# Patient Record
Sex: Female | Born: 1950
Health system: Southern US, Community
[De-identification: ages and names within clinical notes are randomized; demographics above are authoritative.]

## PROBLEM LIST (undated history)

## (undated) DIAGNOSIS — R112 Nausea with vomiting, unspecified: Secondary | ICD-10-CM

## (undated) DIAGNOSIS — T8859XA Other complications of anesthesia, initial encounter: Secondary | ICD-10-CM

## (undated) DIAGNOSIS — K219 Gastro-esophageal reflux disease without esophagitis: Secondary | ICD-10-CM

## (undated) DIAGNOSIS — R011 Cardiac murmur, unspecified: Secondary | ICD-10-CM

## (undated) DIAGNOSIS — F419 Anxiety disorder, unspecified: Secondary | ICD-10-CM

## (undated) DIAGNOSIS — R519 Headache, unspecified: Secondary | ICD-10-CM

## (undated) DIAGNOSIS — R42 Dizziness and giddiness: Secondary | ICD-10-CM

## (undated) DIAGNOSIS — E559 Vitamin D deficiency, unspecified: Secondary | ICD-10-CM

## (undated) DIAGNOSIS — R002 Palpitations: Secondary | ICD-10-CM

## (undated) DIAGNOSIS — M7989 Other specified soft tissue disorders: Secondary | ICD-10-CM

## (undated) DIAGNOSIS — R1310 Dysphagia, unspecified: Secondary | ICD-10-CM

## (undated) DIAGNOSIS — Z9889 Other specified postprocedural states: Secondary | ICD-10-CM

## (undated) DIAGNOSIS — L299 Pruritus, unspecified: Secondary | ICD-10-CM

## (undated) DIAGNOSIS — K449 Diaphragmatic hernia without obstruction or gangrene: Secondary | ICD-10-CM

## (undated) DIAGNOSIS — M2550 Pain in unspecified joint: Secondary | ICD-10-CM

## (undated) DIAGNOSIS — K589 Irritable bowel syndrome without diarrhea: Secondary | ICD-10-CM

## (undated) DIAGNOSIS — R5383 Other fatigue: Secondary | ICD-10-CM

## (undated) DIAGNOSIS — K279 Peptic ulcer, site unspecified, unspecified as acute or chronic, without hemorrhage or perforation: Secondary | ICD-10-CM

## (undated) DIAGNOSIS — R609 Edema, unspecified: Secondary | ICD-10-CM

## (undated) DIAGNOSIS — F439 Reaction to severe stress, unspecified: Secondary | ICD-10-CM

## (undated) DIAGNOSIS — R059 Cough, unspecified: Secondary | ICD-10-CM

## (undated) DIAGNOSIS — T4145XA Adverse effect of unspecified anesthetic, initial encounter: Secondary | ICD-10-CM

## (undated) DIAGNOSIS — M1990 Unspecified osteoarthritis, unspecified site: Secondary | ICD-10-CM

## (undated) DIAGNOSIS — G459 Transient cerebral ischemic attack, unspecified: Secondary | ICD-10-CM

## (undated) DIAGNOSIS — R0602 Shortness of breath: Secondary | ICD-10-CM

## (undated) DIAGNOSIS — F32A Depression, unspecified: Secondary | ICD-10-CM

## (undated) DIAGNOSIS — H9319 Tinnitus, unspecified ear: Secondary | ICD-10-CM

## (undated) DIAGNOSIS — D649 Anemia, unspecified: Secondary | ICD-10-CM

## (undated) DIAGNOSIS — B009 Herpesviral infection, unspecified: Secondary | ICD-10-CM

## (undated) DIAGNOSIS — R05 Cough: Secondary | ICD-10-CM

## (undated) DIAGNOSIS — R682 Dry mouth, unspecified: Secondary | ICD-10-CM

## (undated) DIAGNOSIS — I8390 Asymptomatic varicose veins of unspecified lower extremity: Secondary | ICD-10-CM

## (undated) DIAGNOSIS — M109 Gout, unspecified: Secondary | ICD-10-CM

## (undated) DIAGNOSIS — R51 Headache: Secondary | ICD-10-CM

## (undated) DIAGNOSIS — R062 Wheezing: Secondary | ICD-10-CM

## (undated) DIAGNOSIS — I341 Nonrheumatic mitral (valve) prolapse: Secondary | ICD-10-CM

## (undated) DIAGNOSIS — J189 Pneumonia, unspecified organism: Secondary | ICD-10-CM

## (undated) DIAGNOSIS — K759 Inflammatory liver disease, unspecified: Secondary | ICD-10-CM

## (undated) DIAGNOSIS — I10 Essential (primary) hypertension: Secondary | ICD-10-CM

## (undated) DIAGNOSIS — H539 Unspecified visual disturbance: Secondary | ICD-10-CM

## (undated) DIAGNOSIS — I499 Cardiac arrhythmia, unspecified: Secondary | ICD-10-CM

## (undated) DIAGNOSIS — R079 Chest pain, unspecified: Secondary | ICD-10-CM

## (undated) DIAGNOSIS — F329 Major depressive disorder, single episode, unspecified: Secondary | ICD-10-CM

## (undated) DIAGNOSIS — H532 Diplopia: Secondary | ICD-10-CM

## (undated) DIAGNOSIS — J45909 Unspecified asthma, uncomplicated: Secondary | ICD-10-CM

## (undated) DIAGNOSIS — M436 Torticollis: Secondary | ICD-10-CM

## (undated) DIAGNOSIS — M549 Dorsalgia, unspecified: Secondary | ICD-10-CM

## (undated) DIAGNOSIS — R197 Diarrhea, unspecified: Secondary | ICD-10-CM

## (undated) HISTORY — DX: Irritable bowel syndrome, unspecified: K58.9

## (undated) HISTORY — DX: Diaphragmatic hernia without obstruction or gangrene: K44.9

## (undated) HISTORY — DX: Dizziness and giddiness: R42

## (undated) HISTORY — DX: Major depressive disorder, single episode, unspecified: F32.9

## (undated) HISTORY — DX: Gastro-esophageal reflux disease without esophagitis: K21.9

## (undated) HISTORY — DX: Reaction to severe stress, unspecified: F43.9

## (undated) HISTORY — DX: Asymptomatic varicose veins of unspecified lower extremity: I83.90

## (undated) HISTORY — PX: CARDIAC CATHETERIZATION: SHX172

## (undated) HISTORY — DX: Palpitations: R00.2

## (undated) HISTORY — DX: Transient cerebral ischemic attack, unspecified: G45.9

## (undated) HISTORY — DX: Other fatigue: R53.83

## (undated) HISTORY — PX: FOOT SURGERY: SHX648

## (undated) HISTORY — DX: Cardiac murmur, unspecified: R01.1

## (undated) HISTORY — DX: Dry mouth, unspecified: R68.2

## (undated) HISTORY — DX: Vitamin D deficiency, unspecified: E55.9

## (undated) HISTORY — DX: Tinnitus, unspecified ear: H93.19

## (undated) HISTORY — DX: Pruritus, unspecified: L29.9

## (undated) HISTORY — DX: Cough: R05

## (undated) HISTORY — DX: Dorsalgia, unspecified: M54.9

## (undated) HISTORY — DX: Diarrhea, unspecified: R19.7

## (undated) HISTORY — DX: Nonrheumatic mitral (valve) prolapse: I34.1

## (undated) HISTORY — DX: Cough, unspecified: R05.9

## (undated) HISTORY — DX: Depression, unspecified: F32.A

## (undated) HISTORY — DX: Wheezing: R06.2

## (undated) HISTORY — DX: Anxiety disorder, unspecified: F41.9

## (undated) HISTORY — DX: Dysphagia, unspecified: R13.10

## (undated) HISTORY — DX: Shortness of breath: R06.02

## (undated) HISTORY — DX: Torticollis: M43.6

## (undated) HISTORY — PX: VAGINAL HYSTERECTOMY: SUR661

## (undated) HISTORY — DX: Pain in unspecified joint: M25.50

## (undated) HISTORY — DX: Unspecified visual disturbance: H53.9

## (undated) HISTORY — DX: Unspecified osteoarthritis, unspecified site: M19.90

## (undated) HISTORY — PX: CHOLECYSTECTOMY: SHX55

## (undated) HISTORY — DX: Peptic ulcer, site unspecified, unspecified as acute or chronic, without hemorrhage or perforation: K27.9

## (undated) HISTORY — PX: OTHER SURGICAL HISTORY: SHX169

## (undated) HISTORY — DX: Gout, unspecified: M10.9

## (undated) HISTORY — PX: GALLBLADDER SURGERY: SHX652

## (undated) HISTORY — PX: LASIK: SHX215

## (undated) HISTORY — PX: COLONOSCOPY: SHX174

## (undated) HISTORY — PX: APPENDECTOMY: SHX54

## (undated) HISTORY — DX: Headache: R51

## (undated) HISTORY — DX: Headache, unspecified: R51.9

## (undated) HISTORY — DX: Edema, unspecified: R60.9

## (undated) HISTORY — PX: EYE SURGERY: SHX253

## (undated) HISTORY — PX: BREAST EXCISIONAL BIOPSY: SUR124

## (undated) HISTORY — DX: Diplopia: H53.2

## (undated) HISTORY — DX: Chest pain, unspecified: R07.9

## (undated) HISTORY — DX: Other specified soft tissue disorders: M79.89

---
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
## 1998-07-10 ENCOUNTER — Encounter: Payer: Self-pay | Admitting: Family Medicine

## 1998-07-10 ENCOUNTER — Ambulatory Visit (HOSPITAL_COMMUNITY): Admission: RE | Admit: 1998-07-10 | Discharge: 1998-07-10 | Payer: Self-pay | Admitting: Family Medicine

## 1998-07-22 ENCOUNTER — Other Ambulatory Visit: Admission: RE | Admit: 1998-07-22 | Discharge: 1998-07-22 | Payer: Self-pay | Admitting: Gastroenterology

## 1998-12-11 ENCOUNTER — Emergency Department (HOSPITAL_COMMUNITY): Admission: EM | Admit: 1998-12-11 | Discharge: 1998-12-12 | Payer: Self-pay | Admitting: Emergency Medicine

## 2001-05-08 ENCOUNTER — Encounter (INDEPENDENT_AMBULATORY_CARE_PROVIDER_SITE_OTHER): Payer: Self-pay

## 2001-05-08 ENCOUNTER — Observation Stay (HOSPITAL_COMMUNITY): Admission: RE | Admit: 2001-05-08 | Discharge: 2001-05-09 | Payer: Self-pay

## 2001-07-07 ENCOUNTER — Ambulatory Visit (HOSPITAL_COMMUNITY): Admission: RE | Admit: 2001-07-07 | Discharge: 2001-07-07 | Payer: Self-pay | Admitting: Family Medicine

## 2001-07-07 ENCOUNTER — Encounter: Payer: Self-pay | Admitting: Family Medicine

## 2001-09-26 ENCOUNTER — Ambulatory Visit (HOSPITAL_BASED_OUTPATIENT_CLINIC_OR_DEPARTMENT_OTHER): Admission: RE | Admit: 2001-09-26 | Discharge: 2001-09-26 | Payer: Self-pay | Admitting: Orthopedic Surgery

## 2001-11-15 ENCOUNTER — Encounter: Payer: Self-pay | Admitting: Family Medicine

## 2001-11-15 ENCOUNTER — Ambulatory Visit (HOSPITAL_COMMUNITY): Admission: RE | Admit: 2001-11-15 | Discharge: 2001-11-15 | Payer: Self-pay | Admitting: Family Medicine

## 2002-01-11 ENCOUNTER — Ambulatory Visit (HOSPITAL_COMMUNITY): Admission: RE | Admit: 2002-01-11 | Discharge: 2002-01-11 | Payer: Self-pay | Admitting: Family Medicine

## 2002-01-11 ENCOUNTER — Encounter: Payer: Self-pay | Admitting: Family Medicine

## 2002-03-08 ENCOUNTER — Ambulatory Visit (HOSPITAL_COMMUNITY): Admission: RE | Admit: 2002-03-08 | Discharge: 2002-03-08 | Payer: Self-pay | Admitting: Gastroenterology

## 2002-05-22 ENCOUNTER — Ambulatory Visit (HOSPITAL_BASED_OUTPATIENT_CLINIC_OR_DEPARTMENT_OTHER): Admission: RE | Admit: 2002-05-22 | Discharge: 2002-05-22 | Payer: Self-pay | Admitting: Orthopedic Surgery

## 2002-06-24 ENCOUNTER — Ambulatory Visit (HOSPITAL_BASED_OUTPATIENT_CLINIC_OR_DEPARTMENT_OTHER): Admission: RE | Admit: 2002-06-24 | Discharge: 2002-06-24 | Payer: Self-pay | Admitting: Orthopedic Surgery

## 2002-10-06 ENCOUNTER — Emergency Department (HOSPITAL_COMMUNITY): Admission: EM | Admit: 2002-10-06 | Discharge: 2002-10-07 | Payer: Self-pay | Admitting: Emergency Medicine

## 2003-05-13 ENCOUNTER — Ambulatory Visit (HOSPITAL_COMMUNITY): Admission: RE | Admit: 2003-05-13 | Discharge: 2003-05-13 | Payer: Self-pay | Admitting: Orthopedic Surgery

## 2003-05-13 ENCOUNTER — Ambulatory Visit (HOSPITAL_COMMUNITY): Admission: RE | Admit: 2003-05-13 | Discharge: 2003-05-13 | Payer: Self-pay | Admitting: Family Medicine

## 2004-01-16 ENCOUNTER — Encounter: Admission: RE | Admit: 2004-01-16 | Discharge: 2004-01-16 | Payer: Self-pay | Admitting: Gastroenterology

## 2004-01-16 ENCOUNTER — Encounter: Admission: RE | Admit: 2004-01-16 | Discharge: 2004-01-16 | Payer: Self-pay | Admitting: Family Medicine

## 2004-05-31 ENCOUNTER — Inpatient Hospital Stay (HOSPITAL_COMMUNITY): Admission: EM | Admit: 2004-05-31 | Discharge: 2004-06-03 | Payer: Self-pay | Admitting: Emergency Medicine

## 2004-06-01 ENCOUNTER — Encounter (INDEPENDENT_AMBULATORY_CARE_PROVIDER_SITE_OTHER): Payer: Self-pay | Admitting: Cardiology

## 2004-06-02 ENCOUNTER — Encounter: Payer: Self-pay | Admitting: Internal Medicine

## 2004-07-16 ENCOUNTER — Ambulatory Visit: Admission: RE | Admit: 2004-07-16 | Discharge: 2004-07-16 | Payer: Self-pay | Admitting: Family Medicine

## 2005-11-18 ENCOUNTER — Encounter: Admission: RE | Admit: 2005-11-18 | Discharge: 2005-11-18 | Payer: Self-pay | Admitting: Gastroenterology

## 2005-11-25 ENCOUNTER — Encounter: Admission: RE | Admit: 2005-11-25 | Discharge: 2005-11-25 | Payer: Self-pay | Admitting: Gastroenterology

## 2006-02-06 ENCOUNTER — Ambulatory Visit (HOSPITAL_COMMUNITY): Admission: RE | Admit: 2006-02-06 | Discharge: 2006-02-06 | Payer: Self-pay | Admitting: Interventional Cardiology

## 2006-02-06 ENCOUNTER — Encounter (INDEPENDENT_AMBULATORY_CARE_PROVIDER_SITE_OTHER): Payer: Self-pay | Admitting: *Deleted

## 2006-02-08 ENCOUNTER — Emergency Department (HOSPITAL_COMMUNITY): Admission: EM | Admit: 2006-02-08 | Discharge: 2006-02-08 | Payer: Self-pay | Admitting: Emergency Medicine

## 2008-05-09 ENCOUNTER — Encounter: Admission: RE | Admit: 2008-05-09 | Discharge: 2008-05-09 | Payer: Self-pay | Admitting: Family Medicine

## 2008-05-13 ENCOUNTER — Other Ambulatory Visit: Admission: RE | Admit: 2008-05-13 | Discharge: 2008-05-13 | Payer: Self-pay | Admitting: Family Medicine

## 2008-06-07 ENCOUNTER — Emergency Department (HOSPITAL_COMMUNITY): Admission: EM | Admit: 2008-06-07 | Discharge: 2008-06-07 | Payer: Self-pay | Admitting: Emergency Medicine

## 2008-08-26 ENCOUNTER — Emergency Department (HOSPITAL_COMMUNITY): Admission: EM | Admit: 2008-08-26 | Discharge: 2008-08-26 | Payer: Self-pay | Admitting: Emergency Medicine

## 2008-11-15 ENCOUNTER — Encounter: Admission: RE | Admit: 2008-11-15 | Discharge: 2008-11-15 | Payer: Self-pay | Admitting: Orthopedic Surgery

## 2009-03-19 ENCOUNTER — Emergency Department (HOSPITAL_COMMUNITY): Admission: EM | Admit: 2009-03-19 | Discharge: 2009-03-19 | Payer: Self-pay | Admitting: Emergency Medicine

## 2009-03-24 ENCOUNTER — Encounter: Admission: RE | Admit: 2009-03-24 | Discharge: 2009-03-24 | Payer: Self-pay | Admitting: Family Medicine

## 2009-05-26 ENCOUNTER — Encounter: Admission: RE | Admit: 2009-05-26 | Discharge: 2009-05-26 | Payer: Self-pay | Admitting: Gastroenterology

## 2009-06-08 ENCOUNTER — Encounter: Admission: RE | Admit: 2009-06-08 | Discharge: 2009-06-08 | Payer: Self-pay | Admitting: Orthopedic Surgery

## 2010-07-25 ENCOUNTER — Encounter: Payer: Self-pay | Admitting: Gastroenterology

## 2010-10-01 ENCOUNTER — Other Ambulatory Visit: Payer: Self-pay | Admitting: Gastroenterology

## 2010-10-01 DIAGNOSIS — R197 Diarrhea, unspecified: Secondary | ICD-10-CM

## 2010-10-05 ENCOUNTER — Other Ambulatory Visit: Payer: Self-pay | Admitting: Gastroenterology

## 2010-10-05 ENCOUNTER — Ambulatory Visit
Admission: RE | Admit: 2010-10-05 | Discharge: 2010-10-05 | Disposition: A | Discharge status: Home / Self Care | Payer: Managed Care, Other (non HMO) | Source: Ambulatory Visit | Attending: Gastroenterology | Admitting: Gastroenterology

## 2010-10-05 DIAGNOSIS — R197 Diarrhea, unspecified: Secondary | ICD-10-CM

## 2010-10-08 LAB — POCT URINALYSIS DIP (DEVICE)
Bilirubin Urine: NEGATIVE
Glucose, UA: NEGATIVE mg/dL
Ketones, ur: NEGATIVE mg/dL
Protein, ur: NEGATIVE mg/dL
Urobilinogen, UA: 0.2 mg/dL (ref 0.0–1.0)
pH: 5.5 (ref 5.0–8.0)

## 2010-10-08 LAB — URINALYSIS, MICROSCOPIC ONLY
Bilirubin Urine: NEGATIVE
pH: 6 (ref 5.0–8.0)

## 2010-10-08 LAB — URINE CULTURE

## 2010-11-19 NOTE — Op Note (Signed)
[facility]  Patient:    Jasmine Contreras, Jasmine Contreras Visit Number: [id number] MRN: [id number]          Service Type: SUR Location: 3W [id number] 02 Attending Physician:  Meredith Leeds Proc. Date: 05/08/01 Admit Date:  05/08/2001   CC:         Petra Kuba, M.D.   Operative Report  PREOPERATIVE DIAGNOSES:  Symptomatic gallstones and adhesions.  POSTOPERATIVE DIAGNOSES:  Symptomatic gallstones and adhesions.  OPERATION: 1. Lysis of adhesions. 2. Laparoscopic cholecystectomy.  SURGEON:  Zigmund Daniel, M.D.  ANESTHESIA:  General.  DESCRIPTION OF PROCEDURE:  After the patient had adequate general anesthesia and was well-monitored, and had routine preparation and draping of the abdomen, I infused local anesthetic just below the umbilicus and subsequently anesthetized other port sites.  I made a short transverse incision below the umbilicus and dissected down to the fascia.  Scar was present due to previous surgery.  I cut the fascia longitudinally in the midline for about 1.5 cm and then opened the peritoneum bluntly.  Adhesions were present, and I swept them away and felt that I had a path to the right upper quadrant toward the right side of the abdomen.  I then put in a pursestring 0 Vicryl suture and secured a Hasson cannula and inflated the abdomen with CO2, then put in a camera and saw that there was visibility of the gallbladder and liver, although it was somewhat limited due to previous adhesions.  I did get in a 10 mm epigastric port and two 5 mm right mid abdominal ports and with the patient placed head-up, foot-down, and tilted toward the left, I took down adhesions.  There were quite a few adhesions of the dome of the liver up to the diaphragm and in taking those down, the mobility of the liver was increased.  I then grasped the fundus of the gallbladder with a clamp, elevated it toward the right shoulder, and took down adhesions to the  undersurface of the gallbladder and liver using the scissors and the dissector.  The fundus of the gallbladder pulled up easily, and I was able to dissect the infundibulum of the gallbladder and clearly see the cystic duct emerging from it and also clearly see the cystic artery.  I clipped the cystic duct with four clips and clipped cystic artery with three clips and cut each structure between the two clips closest to the gallbladder.  I then dissected the gallbladder out of the fossa using the spatula cautery and removed it intact.  I got hemostasis with the cautery.  Hemostasis was not a problem.  I removed the gallbladder from the body through the umbilical incision and tied the pursestring suture.  I then copiously irrigated the right upper quadrant and removed the irrigant, and again, assured good hemostasis.  Sponge, needle, and instrument counts were correct.  I closed each of the skin incisions with intracuticular 4-0 Vicryl and Steri-Strips and applied small bandages.  The patient was stable through the operation. Attending Physician:  Meredith Leeds DD:  05/08/01 TD:  05/09/01 Job: [id number]

## 2010-11-19 NOTE — Discharge Summary (Signed)
Jasmine Contreras, SLAVEN            ACCOUNT NO.:  [id number]   MEDICAL RECORD NO.:  [id number]          PATIENT TYPE:  INP   LOCATION:  5709                         FACILITY:  [facility]   PHYSICIAN:  Deirdre Peer. Polite, M.D. DATE OF BIRTH:  1950/10/17   DATE OF ADMISSION:  05/31/2004  DATE OF DISCHARGE:  06/03/2004                                 DISCHARGE SUMMARY   DISCHARGE DIAGNOSES:  1.  Diffuse bronchitis.  Please note no history of asthma or other reactive      airway disease or chronic obstructive pulmonary disease.  Will treat      empirically with antibiotics and steroids with nebulizer for further      outpatient followup per primary physician.  2.  Acute dyspnea, presumed congestive heart failure on admission.  Please      note negative cardiac evaluation.   DISCHARGE MEDICATIONS:  The patient will resume outpatient medications.  1.  Zoloft 50 mg 1 daily.  2.  Azithromycin 250 mg 1 daily x 5 days.  3.  Prednisone taper over 10 days.  4.  Albuterol MDI 2 puffs four times a day x 3 days, then p.r.n.   DISPOSITION:  The patient is discharged home in stable condition. Will  follow up with primary physician in one week. Recommend further outpatient  followup; i.e., chest x-ray and set of PFTs to rule out undiagnosed reactive  airway disease.   CONSULTATIONS:  Armanda Magic, M.D., cardiology.   STUDIES:  The patient had a chest x-ray which showed probable CHF,  increasing perihilar and lower lobe opacities bilaterally.  No confluent air  space opacity or significant pleural effusion seen.   CT of the chest:  No PE, bilateral lower lobe atelectasis, diffuse  bronchitic changes, borderline cardiomegaly, multiple liver cysts.   CBC on admission within normal limits.  BMET essentially within normal  limits except for mild hypokalemia.  Cardiac enzymes negative for ischemia.  BNP 53.  TSH 2.0.  Sed rate 36.  Lipid panel:  Total cholesterol 166, HDL  40, LDL 79.  ANA negative.   Rheumatoid factor less than 20.  ABG on room air  showed pH of 7.4, PCO2 of 35, PO2 of 71.   HISTORY OF PRESENT ILLNESS:  A 60 year old female with multiple medical  problems presented to the ED with sudden onset of shortness of breath and  congestion during sexual activity.  In the ED, the patient was evaluated and  had abnormal chest x-ray.  Concern for flash pulmonary edema was  entertained.  The patient was admitted for further evaluation and treatment.  Please see dictated H&P for further details.   MEDICATIONS ON ADMISSION:  Zoloft.   PAST MEDICAL HISTORY:  1.  Brain stem cyst.  2.  Obesity.  3.  History of hiatal hernia with GERD.  4.  History of vagotomy secondary to peptic ulcer disease in the distant      past.   PAST SURGICAL HISTORY:  1.  Cholecystectomy in 2001.  2.  Total abdominal hysterectomy.  3.  Bilateral bursectomy of greater trochanter.  4.  Laparoscopic knee surgery.  SOCIAL HISTORY:  Negative for tobacco x 20 years.  No alcohol, no drugs.   REVIEW OF SYSTEMS:  As stated in HPI.   HOSPITAL COURSE:  The patient was admitted to the medicine floor for  evaluation and treatment of acute onset dyspnea with concern for flash  pulmonary edema.  The patient had serial cardiac enzymes which were negative  for ischemia, a BNP which was within normal limits.  Because of concern for  flash pulmonary edema, the patient was seen in consultation by cardiology.  The patient had an echocardiogram which showed an EF in the range of 55 to  65%, no wall motion abnormalities.  The patient also underwent cardiac  catheterization which showed normal coronary arteries and low-normal EF of  50%, mild MR. There was no evidence of coronary artery disease or vasospasm  during cardiac catheterization.   Per cardiac consult, the patient's dyspnea was ruled out.  The patient had  further evaluation to rule out pulmonary cause.  As her exam had bibasilar  crackles, concern for  interstitial lung disease was entertained.  The  possibilities include asthma, PE, sarcoidosis, etc.  The patient has several  biochemical studies, sed rate, ANA which were within normal limits.  The  patient had a CT scan of chest, negative PE, did show mild bilateral lower  lobe atelectasis, diffuse bronchitic changes.  Also showed several liver  cysts.   At this time, as the patient did not have identifiable cardiac cause for  dizziness, it was thought that most likely pulmonary cause was source.  The  patient does not carry a history of asthma and has not smoked in greater  than 20 years.  The patient will be treated with antibiotics and a steroid  taper as well as inhaler.  Recommend patient have further outpatient  followup per primary MD.  If the patient again had exertional symptoms of  dyspnea, probably should continue to evaluate from a pulmonary standpoint.  The patient may require pulmonary function tests on an outpatient basis.   At this time, the patient is stable for discharge.  The patient has other  chronic medical problems, and she will continue her medications as outlined.      Joen Laura   RDP/MEDQ  D:  06/03/2004  T:  06/03/2004  Job:  [id number]   cc:   Dario Guardian, M.D.  [address]  [place]  [place] [address]  Fax: [phone]

## 2010-11-19 NOTE — Op Note (Signed)
[place]  Patient:    Jasmine Contreras, Jasmine Contreras Visit Number: [id number] MRN: [id number]          Service Type: DSU Location: [facility] Attending Physician:  Milly Jakob Dictated by:   Harvie Junior, M.D. Proc. Date: 09/26/01 Admit Date:  09/26/2001                             Operative Report  PREOPERATIVE DIAGNOSIS:  Medial meniscal tear versus medial patellofemoral pain.  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  PRINCIPAL PROCEDURE: 1. Debridement of medial shelf plica. 2. Debridement of medial patellar chondromalacia.  SURGEON:  Harvie Junior, M.D.  ASSISTANT:  Currie Paris. Thedore Mins.  ANESTHESIA:  General.  BRIEF HISTORY:  This is a 60 year old female with a long history of having medial sided joint pain.  She ultimately was evaluated and felt to have no significant degenerative changes.  MRI was obtained, which showed some degenerative changes in the meniscus, but no obvious meniscal tear.  She was followed conservatively, but continued to have significant pain and ultimately was seen by Dr. Turner Daniels and set up for surgery because of continued ______ medial sided pain.  She is brought to the operating room for this procedure.  PROCEDURE:  The patient was brought to the operating room and after adequate anesthesia was obtained with general anesthetic, the patient placed supine on the operating table. The right knee was then prepped and draped in usual sterile fashion and then following this, examination under anesthesia noted to be stable in all directions.  Routine arthroscopic examination revealed that there was obvious chondromalacia of the medial femoral condyle.  This was debrided with a suction-shaver back to a smooth and stable rim.  There was an obvious medial plica, which was causing the erosion on the medial femoral condyle and this was debrided so that there was no pressure against  the medial femoral condyle. Attention was turned to opening the patellofemoral joint. Slight lateral tracking, not dramatic, but there was some chondral injury on the medial patellar facet.  This was debrided with a suction-shaver. Attention was then turned to the medial joint line, where the medial meniscus was noted to be stable.  There was some degenerative area that could be probed, although the meniscus could not be brought forward.  There certainly was a central area in the meniscus, which was causative of some concern.  We probed it at length. It was felt to be stable in all directions.  We did a good long look at the medial femoral condyle, no soft areas, no areas of chondromalacia.  Turned to the anterior cruciate, which was normal.  Turned to the lateral side, which was normal and then probed the lateral side, the meniscus, as well as the articular cartilage.  Ultimately felt that we had taken care of the pathology with debridement of the medial patellar facet, as well as the takedown of the medial plica.  At this point, the knee was copiously irrigated and suctioned dry.  Arthroscopic portals were closed with sterile and compressive dressing.  The patient was taken to recovery room, where she was noted to be satisfactory condition.  Estimated blood loss for the procedure was none. Dictated by:   Harvie Junior, M.D. Attending Physician:  Milly Jakob DD:  09/26/01 TD:  09/27/01 Job: [id number]

## 2010-11-19 NOTE — Op Note (Signed)
NAME:  Jasmine Contreras, Jasmine Contreras                      ACCOUNT NO.:  [id number]   MEDICAL RECORD NO.:  [id number]                   PATIENT TYPE:  AMB   LOCATION:  DSC                                  FACILITY:  [facility]   PHYSICIAN:  Harvie Junior, M.D.                DATE OF BIRTH:  06-Oct-1950   DATE OF PROCEDURE:  06/24/2002  DATE OF DISCHARGE:                                 OPERATIVE REPORT   PREOPERATIVE DIAGNOSIS:  Iliotibial band tendinitis with prominent  trochanteric ridge, left hip.   POSTOPERATIVE DIAGNOSIS:  Iliotibial band tendinitis with prominent  trochanteric ridge, left hip.   OPERATION PERFORMED:  1. Release of iliotibial band with debridement of trochanteric bursitis.  2. Partial exostectomy of the greater trochanter.   SURGEON:  Harvie Junior, M.D.   ANESTHESIA:  General.   INDICATIONS FOR PROCEDURE:  The patient is a 60 year old female with a long  history of having severe bilateral trochanteric bursitis.  She has done well  with injection therapy into each of her hips.  Because of recurrence, she  had been treated conservatively for a long period of time.  Because of  continued complaints of pain she was taken to the operating room for  iliotibial band incision, trochanteric bursectomy and partial exostectomy of  the greater trochanter.   DESCRIPTION OF PROCEDURE:  The patient was taken to the operating room.  After adequate anesthesia was obtained with general anesthetic, the patient  was placed supine on the operating table where she was moved to the right  lateral decubitus position.  All bony prominences were well padded and an  axillary roll was put in place.  Following this, attention was turned to the  left hip where a curved incision was made over the proximal area of the hip.  Subcutaneous tissue was dissected down to the level of the greater  trochanter, the IP band was identified and clearly divided in line with its  fibers.  It was then divided  both proximally and distally to create a T type  effect.  Following this, there was no tendency for any tendon to be over the  greater trochanter.  Following this, the trochanteric bursa was identified  and excised from the gluteus medius insertion down to the really as far as  we could see down the vastus lateralis.  The wound was then copiously  irrigated and suctioned dry.  Attention was turned to the greater trochanter  where the trochanteric ridge was identified and a rongeur was used to excise  significant portions of the trochanteric ridge.  Following this, the wound  was copiously irrigated and suctioned dry.  The portals of the area were  closed with deep suture and the subcu was closed with 0 and 2-0 Vicryl, the  skin with staples.  The estimated blood loss for this procedure was less  than 25 cc.  Harvie Junior, M.D.    Ranae Plumber  D:  06/24/2002  T:  06/24/2002  Job:  [id number]

## 2010-11-19 NOTE — Op Note (Signed)
NAME:  Jasmine Contreras, Jasmine Contreras                      ACCOUNT NO.:  [id number]   MEDICAL RECORD NO.:  [id number]                   PATIENT TYPE:  AMB   LOCATION:  DSC                                  FACILITY:  [facility]   PHYSICIAN:  Harvie Junior, M.D.                DATE OF BIRTH:  09/17/1950   DATE OF PROCEDURE:  DATE OF DISCHARGE:                                 OPERATIVE REPORT   PREOPERATIVE DIAGNOSIS:  Iliotibial band tendinitis, recalcitrant to  conservative care.   POSTOPERATIVE DIAGNOSIS:  Iliotibial band tendinitis, recalcitrant to  conservative care.   PROCEDURE:  1. Iliotibial band release.  2. Partial exostectomy of the greater trochanteric bone.   SURGEON:  Dr. Luiz Blare.   ASSISTANT:  Marshia Ly, P.A.   ANESTHESIA:  General.   BRIEF HISTORY:  This 60 year old female with a long history of having  bilateral IT band tendinitis, injection therapy into the greater  trochanteric area seemed to work wonderfully for her IT band tendinitis, but  she continued to have recurrence of this.  Physical therapy was tried and  helped.  Anti-inflammatory medications were tried and helped.  Her back was  worked up to make sure she did not have bulging disk that could be causing  hip pain, but because of her improvement with injection therapy but lack of  complete response, she ultimately was brought to the operating room for this  procedure.   Prior to surgery, it was discussed with her 75% good and excellent results  with a 25% chance of her not having an improvement in her pain.  She  understood this risk and was taken to the operating room for this procedure.   DESCRIPTION OF PROCEDURE:  The patient was taken to the operating room and  after adequate general anesthesia was obtained, the patient was placed in  the left lateral decubitus position.  Axillary roll was put in place, all  bony prominences were well padded.  Attention was then turned to the right  hip where an  incision was made over the right hip.  Subcutaneous tissues  separated down to the level of the IT band.  The IT band was then identified  and divided in line with its fibers longitudinally.  The IT band was then  T'd in an anterior and posterior direction.  Excellent freeing up of the IT  band was achieved at this point.  Attention was then turned to the greater  trochanteric bursa where a massive bursectomy was undertaken as well as a  partial exostectomy.  The trochanter did have some areas of sharpness of  spurring and that kind of thing, and this was debrided with a rongeur back  to a smooth rim.  Bone wax was put in place over this area of roughened  bone.  At this point, the wound was copiously irrigated and suctioned dry.  Final check was made for any bleeding deep,  and none being found, the deep  and fascial layers were closed.  The IT band was left open.  Following  closure of the deep fascial layers, the skin was closed with skin  staples.  A sterile compressive dressing was applied.  The patient was taken  to the recovery room where she was noted to be in satisfactory condition.   ESTIMATED BLOOD LOSS:  Less than 50 cc.                                               Harvie Junior, M.D.    Ranae Plumber  D:  05/22/2002  T:  05/22/2002  Job:  [id number]

## 2010-11-19 NOTE — Op Note (Signed)
TNAMEGINNETTE, GATES                     ACCOUNT NO.:  [id number]   MEDICAL RECORD NO.:  [id number]                   PATIENT TYPE:  AMB   LOCATION:  ENDO                                 FACILITY:  [facility]   PHYSICIAN:  Petra Kuba, M.D.                 DATE OF BIRTH:  [birth year]   DATE OF PROCEDURE:  03/08/2002  DATE OF DISCHARGE:                                 OPERATIVE REPORT   PROCEDURE:  Colonoscopy.   INDICATION:  Normal CT scan, persistent GI symptoms.  Consent was signed  after risks, benefits, methods, options thoroughly discussed in the office.   MEDICINES USED:  Demerol 100, Versed 10.   DESCRIPTION OF PROCEDURE:  Rectal inspection was pertinent for external  hemorrhoids.  Digital exam was negative.  The video pediatric adjustable  colonoscope was inserted, easily advanced to about the level of the splenic  flexure.  At that point, there was looping, and rolling her on her back, we  were able to advance to the mid transverse.  Unfortunately, with increased  looping, we were unable to control the loop, and we elected to withdraw.  No  obvious abnormality but left-sided occasional diverticula were seen on  withdrawal.  We went ahead and inserted the regular colonoscope and this  time with abdominal pressure and rolling her on her back and then on her  right side, we were able to advance to the cecum which was identified by the  appendiceal orifice and the ileocecal valve.  The scope was then slowly  withdrawn.  The prep was adequate.  There was some liquid stool that  required washing and suctioning.  Other than the scattered left-sided  diverticula, more in the descending than in the sigmoid, but nonsignificant  and no signs of diverticulitis or any other abnormalities.  The scope was  withdrawn back to the rectum.  Once back in the rectum, the scope was  retroflexed, pertinet for some internal hemorrhoids.  The scope was  straightened, advanced a short ways up  the left side of the colon; air was  suctioned and scope removed.  The patient tolerated the fairly adequately.  There were no obvious immediate complications.   ENDOSCOPIC DIAGNOSES:  1. Internal-external hemorrhoids.  2. Left-sided diverticula, mild, mostly in the descending.  3. Otherwise within normal limits to the cecum.   PLAN:  Trial of antispasmodics.  Consider small-bowel follow-through, but  possibly her increased symptoms are due to her significant adhesions.  Follow up with me p.r.n. or in two months to recheck symptoms and make sure  no further work-up plans are needed.                                               Petra Kuba, M.D.    MEM/MEDQ  D:  03/08/2002  T:  03/08/2002  Job:  [id number]   cc:   Stacie Acres. Cliffton Asters, M.D.

## 2010-11-19 NOTE — Consult Note (Signed)
Jasmine Contreras, Jasmine Contreras            ACCOUNT NO.:  [id number]   MEDICAL RECORD NO.:  [id number]          PATIENT TYPE:  INP   LOCATION:  6523                         FACILITY:  [facility]   PHYSICIAN:  Armanda Magic, M.D.     DATE OF BIRTH:  Dec 05, 1950   DATE OF CONSULTATION:  05/31/2004  DATE OF DISCHARGE:                                   CONSULTATION   CHIEF COMPLAINT:  Chest pain.   This is a 60 year old black female with no prior coronary disease or  hypertension  history who was admitted through the emergency room last  evening after an acute episode of substernal chest pain with associated  flash pulmonary edema seen on chest x-ray.  The patient states symptoms of  chest pain came on gradually during sexual activity with abrupt increase in  respiratory symptoms or orthopnea, wheezing, and coughing.  She also  developed substernal chest pressure approximately 4-5/10 lasting about 30  minutes and persisted until she arrived in the emergency room and was  treated with sublingual nitroglycerine and IV Lasix with resolution of her  chest pain and improvement in shortness of breath.  Currently, she has been  having intermittent episodes of chest pain during her hospital stay.  She  does relate a three-month history of increasing exercise intolerance with  increasing fatigue, excessive leg fatigue with minimal exertion.  She also  has been having episodic chest pressure then shortness of breath.  She has  also had a weight gain of 20 pounds over the past 4-6 weeks which she  suspects is due to her topical steroid cream.  She and her family have also  noted that she appears somewhat swollen.   FAMILY HISTORY:  Her father had cerebrovascular accident x3 and rheumatoid  arthritis.  She has aunts and uncles who have had atrial fibrillation,  congestive heart failure, and sarcoid.   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 Thad Ranger, obesity, menopause, vasomotor symptoms, hiatal hernia with  gastroesophageal reflux, prior vagotomy secondary to peptic ulcer disease.   PAST SURGICAL HISTORY:  Cholecystectomy in 2001, total abdominal  hysterectomy, solitary ovary remains, bilateral bursectomies of greater  trochanteric bursa, orthoscopic knee surgery and hammer toe surgery.   ALLERGIES:  CODEINE causes nausea and vomiting.   HOME MEDICATIONS:  Zoloft 50 mg a day for hot flashes, over-the-counter soy  supplement and topical steroid cream for hot flashes.   INPATIENT MEDICATIONS:  1.  Zoloft 50 mg daily.  2.  Isordil 10 mg t.i.d.  3.  Aspirin 325 mg daily.  4.  Lasix 20 mg daily.  5.  Lovenox 1 mg/kg q.12h.  6.  Kay Ciel 20 mEq b.i.d.  7.  Protonix 40 mg daily.  8.  IV nitroglycerin drip.   SOCIAL HISTORY:  She smoked as a child and she socially drinks alcohol,  small amounts infrequently.  She is married.  She works in collections.   Her review of systems is other than what is stated  in the HPI includes a  history of gastroesophageal reflux and alopecia and recent workup of facial  numbness showed the findings stated above with colloid cyst.  She also has  had problems with hot flashes for the past 12 months.   PHYSICAL EXAMINATION:  GENERAL:  Well-developed, well-nourished  black  female in no acute distress.  Alert and oriented x3 with no cranial nerve  deficits.  HEENT:  Benign.  VITAL SIGNS:  Afebrile with blood pressure 137/75, heart rate 66,  respirations 20.  NECK:  Supple without lymphadenopathy.  Carotid upstrokes +2 bilaterally  with no bruits.  LUNGS:  Clear to auscultation throughout.  HEART:  Regular rate and rhythm with no murmurs, rubs or gallops.  Normal  S1, S2.  ABDOMEN:  Soft, nontender, nondistended.  Normal active bowel sounds.  No  hepatosplenomegaly.  EXTREMITIES:  No edema.  Good distal pulses.   LABORATORY DATA:  TSH  2.085, BNP 68, sodium 135, potassium 3.3, chloride  108, bicarb 21, BUN 9, creatinine 0.6, glucose 98, white cell count 6.9,  hemoglobin 11.9, hematocrit 39.1, platelet count 295, myoglobin 55, CPK 69,  MB 1.1 and 1.3, troponin less than 0.05 and less than 0.01.  EKG shows sinus  rhythm with no ST-T changes.  2-D echocardiogram done one year ago shows EF  57% with aortic valve sclerosis, thickened mitral valve with damage of the  mitral valve leaflets.  Repeat echo is pending.  Chest x-ray shows increased  perihilar and lower lobe opacities consistent with congestive heart failure.   IMPRESSION:  1.  Chest pain with flash pulmonary edema, questionable etiology.  Need to      definitely rule out an ischemic etiology given her episodic chest pain      for several months now.  She is currently stable on IV nitroglycerin,      aspirin, Lovenox, and Lasix.  EKG is nonischemic and enzymes are      negative x2.  2.  Family history of sarcoid and rheumatoid arthritis.  3.  Hypokalemia.  4.  History of benign colloid cyst of the lateral ventricle and a      nonenhancing lesion of the brain stem.  I have spoken to Dr. Thad Ranger.      He feels she is safe from an anticoagulation standpoint to undergo PCI      if needed with anticoagulation.  5.  History of obesity.  6.  History of hiatal hernia.   PLAN:  1.  Repeat potassium as you are doing.  2.  Continue aspirin, Lovenox, Lasix, and IV nitro.  3.  Will plan cardiac catheterization tomorrow to evaluate coronary anatomy.      Risks and benefits of the cardiac      catheterization have been explained including the risk of myocardial      infarction, cerebrovascular accident, death, bleeding complications, IV      contrast dye allergy, aneurysm, AV fistula and renal failure.  The      patient understands and wishes to proceed.      Trac   TT/MEDQ  D:  05/31/2004  T:  05/31/2004  Job:  [id number]   cc:   Casimiro Needle L. Thad Ranger, M.D. [address]  [address]  [place]  [place] [address]  Fax: [phone]

## 2010-11-19 NOTE — Op Note (Signed)
NAMEJONETTE, Jasmine Contreras            ACCOUNT NO.:  [id number]   MEDICAL RECORD NO.:  [id number]          PATIENT TYPE:  INP   LOCATION:  6523                         FACILITY:  [facility]   PHYSICIAN:  Armanda Magic, M.D.     DATE OF BIRTH:  20-Feb-1951   DATE OF PROCEDURE:  06/01/2004  DATE OF DISCHARGE:                                 OPERATIVE REPORT   REFERRING PHYSICIAN:  Dr. Severiano Gilbert as well as Dr. Suanne Marker.   PROCEDURE:  Left heart catheterization, coronary angiography, left  ventriculography.   OPERATION PERFORMED:  Dr. Armanda Magic.   INDICATIONS:  Chest pain, shortness of breath.   COMPLICATIONS:  None.   MEDICATIONS:  Versed 2 mg IV.   This is a 60 year old black female with no previous cardiac history who has  been having multiple episodes of substernal chest pain with exertion and  presented with increasing shortness of breath.  A chest x-ray showed  perihilar and lower lobe opacities which were felt to possibly be due to CHF  although of note BNP was completely normal.  A 2D echocardiogram a year ago  showed a normal EF of 57% with mild aortic valve sclerosis and mild  thickened mitral valve leaflets.  She now presents for heart  catheterization.   The patient was brought to the cardiac catheterization laboratory in a  fasting nonsedated state.  Informed consent was obtained.  The patient was  connected to continuous heart rate and pulse oximetry monitor and  intermittent blood pressure monitoring.  The right groin was prepped and  draped in a sterile fashion.  One percent Xylocaine was used for local  anesthesia.  Using the modified Seldinger technique a 6 French sheath was  placed in the right femoral artery.  Under fluoroscopic guidance a 6 [place]  JR4 catheter was placed in the left coronary artery.  Multiple cine films  were taken, a 30 degree RAO, a 40 degree LA views.  This catheter was then  exchanged over a guide wire for a 6 [place] JR4 catheter which was  placed  under fluoroscopic guidance in the right coronary artery.  Multiple cine  films were taken, a 30 degree RAO, 40 degree LA views.  This catheter was  then exchanged over a guide wire for a 6 French angled pigtail catheter  which was placed under fluoroscopic guidance in the left ventricular  catheter.  Left ventriculography was performed at 30 degree RAO view with a  total 30 mL of contrast at 15 mL per second.  The catheter was then pulled  back across the aortic valve with no significant gradient noted.  The  catheter was then pulled into the aortic root and aortic root angiography  was performed using a total of 30 mL of contrast at 20 mL per second.  No  evidence of dissection was noted and then the catheter was removed over the  guide wire.  At the end of the procedure all catheters and sheaths were  removed.  Manual compression was  performed until adequate hemostasis was  obtained.  The patient was transferred back to her room  in stable condition.   RESULTS:  1.  Left main coronary artery is widely patent and bifurcates in the left      anterior descending and left circumflex artery.  Left anterior      descending artery is widely patent throughout its course to the apex and      gives rise to a diagonal branch which is widely patent.  The left      circumflex is widely patent throughout its course in the AV groove and      gives rise to one very large obtuse marginal branch which is widely      patent.   The right coronary artery is widely patent throughout its course distally  and bifurcates distally to a posterior descending artery and posterolateral  artery, both of which are widely patent.   Left ventriculography shows low normal left ventricle systolic function,  ejection fraction 50%, with mild MR1-2+.  Aortic root angiography shows no  evidence of aortic dissection and normal size of aorta.   ASSESSMENT:  1.  Noncardiac chest pain.  2.  Shortness of breath with  opacities on chest x-ray with normal BNP, I do      not think this probably corresponds with congestive heart failure.  Need      to rule out other primary pulmonary etiologies.  Left ventricular      function is low normal with an ejection fraction of 50%.  3.  Mild mitral regurgitation.  4.  No evidence of coronary disease or vasospasm and the patient did have      9/10 chest pain while she was on the table during the catheterization      and there was no evidence of coronary vasospasm or EKG changes.  Most      likely chest pain is secondary to a gastrointestinal etiology.   PLAN:  IV fluid, bedrest, further workup per primary MD, questionable  gastroenterology workup.  We will check the results of the 2D echocardiogram  and evaluation of mitral valve.      Trac   TT/MEDQ  D:  06/01/2004  T:  06/01/2004  Job:  [id number]

## 2010-11-19 NOTE — H&P (Signed)
NAMEEVALYNNE, LOCURTO NO.:  [id number]   MEDICAL RECORD NO.:  [id number]          PATIENT TYPE:  EMS   LOCATION:  ED                           [facility]ACILITY:  [facility]   PHYSICIAN:  Candyce Churn, M.D.DATE OF BIRTH:  [birth year]   DATE OF ADMISSION:  05/31/2004  DATE OF DISCHARGE:                                HISTORY & PHYSICAL   CHIEF COMPLAINT:  Chest congestion with exertion.   HISTORY OF PRESENT ILLNESS:  Ms. Volkert is a pleasant 60 year old female  with a history of:  1)  brain stem cyst/question blood clot three months  ago; 2)  menopause, with vasomotor symptoms; 3)  obesity; 4)  history of  hiatal hernia with GERD; 5)  history of vagotomy secondary to peptic ulcer  disease in the distant past.  She presents with the sudden onset of  shortness of breath and chest congestion during sexual activity after  retiring tonight.  The patient reports that she has noticed leg heaviness  with walking as of late and decreased exercise tolerance.  The patient came  to the emergency room, and chest x-ray revealed pulmonary edema pattern, but  BNP was normal.  She is now being admitted for flash pulmonary edema  likely secondary to exertion.   The patient had a cardiac workup approximately one to two years ago and it  was okay.  This is per Dr. Daisy Floro.  IV Lasix and nitroglycerin spray  relieved her symptoms in the emergency room this evening.   MEDICATIONS:  Zoloft 50 mg daily for hot flashes.   ALLERGIES:  CODEINE AND CODEINE DERIVATIVES cause nausea and vomiting.   PAST SURGICAL HISTORY:  1.  Status post cholecystectomy in approximately 2001 per Dr. Lebron Conners.  2.  Total abdominal hysterectomy at [facility].  One ovary remains.  3.  Bilateral bursectomies of the greater trochanters.  4.  Arthroscopic knee surgery.  5.  Hammertoes.  6.  Vagotomy, as above.   HEALTH MAINTENANCE:  The patient had a mammogram that was normal in July  2005.   SOCIAL  HISTORY:  No tobacco use in 20 years.  No alcohol.  Married.  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.   REVIEW OF SYSTEMS:  Positive for decreased exercise tolerance for about a  month.  This occurs when she ambulates.  Her legs feel heavy.  She denies  chest pain.  Appetite is okay.  No orthopnea until tonight.   PHYSICAL EXAMINATION:  GENERAL:  Alert and oriented female, no acute  distress.  She is not short of breath during my evaluation.  BLOOD PRESSURE:  Initially was 159/83; now is 107/58.  PULSE:  58 and regular.  RESPIRATORY RATE:  28 and labored when she was admitted to the emergency  room; now is 17 and nonlabored.  This is after nitroglycerin and Lasix  therapy.  HEENT:  Atraumatic, normocephalic.  NECK:  Supple, without JVD or obvious thyromegaly.  CHEST:  Has rales in the bases bilaterally.  CARDIAC:  Regular rhythm, with occasional early systole.  1/6 systolic  ejection murmur  at the right second intercostal space.  ABDOMEN:  Soft, nontender.  Bowel sounds are normal.  Nondistended.  EXTREMITIES:  Without cords or erythema or edema.  NEUROLOGIC:  Oriented x 3, nonfocal.  Has minimally slurred speech  occasionally which is chronic since CNS episode this summer.   White count 6,900, hemoglobin 11.9, platelet count 295,000.  Sodium 135,  potassium 3.3, chloride 108, bicarbonate 21, BUN 9, creatinine 0.6, glucose  98, calcium 9.  BNP 68.4, CK 55, CK-MB 1.3, troponin I less than 0.05.   Chest x-ray shows pulmonary edema pattern.  EKG:  Sinus rhythm at 62,  nonspecific ST-T wave changes.   ASSESSMENT:  A 60 year old female with decreased exertional tolerance as of  late is with probable flash pulmonary edema this p.m. with sexual activity.  Cardiac ischemia and echocardiographic workup is imperative.   PLAN:  1.  Admit.  Monitor.  Check serial cardiac enzymes.  Lovenox therapy and O2      therapy, and Isordil 10 t.i.d.  2.  Cardiac consult.  3.  I am aware that  the patient had an intracranial abnormality on MRI      several months ago.  Need to contact Dr. Kelli Hope in a.m. about      anticoagulation therapy.     Robe   RNG/MEDQ  D:  05/31/2004  T:  05/31/2004  Job:  [id number]   cc:   Dario Guardian, M.D.  [address]  [place]  [place] [address]  Fax: [phone]   Marolyn Hammock. Thad Ranger, M.D.  [address]  [address]  [place]  [place] [address]  Fax: [phone]

## 2010-12-12 ENCOUNTER — Emergency Department (HOSPITAL_COMMUNITY)
Admission: EM | Admit: 2010-12-12 | Discharge: 2010-12-12 | Disposition: A | Discharge status: Home / Self Care | Payer: Managed Care, Other (non HMO) | Attending: Emergency Medicine | Admitting: Emergency Medicine

## 2010-12-12 DIAGNOSIS — R51 Headache: Secondary | ICD-10-CM | POA: Insufficient documentation

## 2010-12-12 DIAGNOSIS — Z79899 Other long term (current) drug therapy: Secondary | ICD-10-CM | POA: Insufficient documentation

## 2010-12-12 DIAGNOSIS — R040 Epistaxis: Secondary | ICD-10-CM | POA: Insufficient documentation

## 2010-12-12 DIAGNOSIS — I4891 Unspecified atrial fibrillation: Secondary | ICD-10-CM | POA: Insufficient documentation

## 2010-12-12 DIAGNOSIS — Z8711 Personal history of peptic ulcer disease: Secondary | ICD-10-CM | POA: Insufficient documentation

## 2010-12-12 DIAGNOSIS — J45909 Unspecified asthma, uncomplicated: Secondary | ICD-10-CM | POA: Insufficient documentation

## 2011-04-07 LAB — URINALYSIS, ROUTINE W REFLEX MICROSCOPIC
Bilirubin Urine: NEGATIVE
Hgb urine dipstick: NEGATIVE
Ketones, ur: NEGATIVE mg/dL

## 2011-04-07 LAB — COMPREHENSIVE METABOLIC PANEL WITH GFR
ALT: 40 U/L — ABNORMAL HIGH (ref 0–35)
AST: 38 U/L — ABNORMAL HIGH (ref 0–37)
Albumin: 3.5 g/dL (ref 3.5–5.2)
Alkaline Phosphatase: 88 U/L (ref 39–117)
BUN: 9 mg/dL (ref 6–23)
CO2: 24 meq/L (ref 19–32)
Calcium: 8.2 mg/dL — ABNORMAL LOW (ref 8.4–10.5)
Chloride: 104 meq/L (ref 96–112)
Creatinine, Ser: 0.67 mg/dL (ref 0.4–1.2)
GFR calc Af Amer: >60 mL/min (ref >60)
GFR calc non Af Amer: >60 mL/min (ref >60)
Glucose, Bld: 92 mg/dL (ref 70–99)
Potassium: 3.4 meq/L — ABNORMAL LOW (ref 3.5–5.1)
Sodium: 136 meq/L (ref 135–145)
Total Bilirubin: 0.9 mg/dL (ref 0.3–1.2)
Total Protein: 6.9 g/dL (ref 6.0–8.3)

## 2011-04-07 LAB — DIFFERENTIAL
Basophils Relative: 1 % (ref 0–1)
Eosinophils Relative: 3 % (ref 0–5)
Monocytes Absolute: 0.4 10*3/uL (ref 0.1–1.0)
Monocytes Relative: 7 % (ref 3–12)
Neutro Abs: 3.5 10*3/uL (ref 1.7–7.7)
Neutrophils Relative %: 55 % (ref 43–77)

## 2011-04-07 LAB — POCT CARDIAC MARKERS
CKMB, poc: <1 ng/mL — ABNORMAL LOW (ref 1.0–8.0)
Myoglobin, poc: 64.9 ng/mL (ref 12–200)
Troponin i, poc: <0.05 ng/mL (ref 0.00–0.09)

## 2011-04-07 LAB — CBC
HCT: 35.2 % — ABNORMAL LOW (ref 36.0–46.0)
Platelets: 290 10*3/uL (ref 150–400)

## 2011-04-07 LAB — D-DIMER, QUANTITATIVE: D-Dimer, Quant: 0.66 ug/mL-FEU — ABNORMAL HIGH (ref 0.00–0.48)

## 2011-05-03 ENCOUNTER — Other Ambulatory Visit: Payer: Self-pay | Admitting: Family Medicine

## 2011-05-03 DIAGNOSIS — Z1231 Encounter for screening mammogram for malignant neoplasm of breast: Secondary | ICD-10-CM

## 2011-05-06 ENCOUNTER — Ambulatory Visit
Admission: RE | Admit: 2011-05-06 | Discharge: 2011-05-06 | Disposition: A | Discharge status: Home / Self Care | Payer: Managed Care, Other (non HMO) | Source: Ambulatory Visit | Attending: Family Medicine | Admitting: Family Medicine

## 2011-05-06 DIAGNOSIS — Z1231 Encounter for screening mammogram for malignant neoplasm of breast: Secondary | ICD-10-CM

## 2011-06-13 ENCOUNTER — Emergency Department (HOSPITAL_COMMUNITY)
Admission: EM | Admit: 2011-06-13 | Discharge: 2011-06-13 | Discharge status: Against Medical Advice | Payer: Managed Care, Other (non HMO) | Attending: Emergency Medicine | Admitting: Emergency Medicine

## 2011-06-13 DIAGNOSIS — R51 Headache: Secondary | ICD-10-CM | POA: Insufficient documentation

## 2011-06-13 NOTE — ED Notes (Signed)
Pt called x 2 with no answer  

## 2011-06-13 NOTE — ED Notes (Signed)
Patient sent from Dr. Lucilla Lame office for further evaluation of a headache that was unresponsive to maxalt.  Patient reports symptoms x 2 days as a throbbing, constant headache.

## 2011-06-13 NOTE — ED Notes (Signed)
Pt called in all waiting areas, unable to locate 

## 2011-06-15 ENCOUNTER — Other Ambulatory Visit: Payer: Self-pay | Admitting: Family Medicine

## 2011-06-15 DIAGNOSIS — R42 Dizziness and giddiness: Secondary | ICD-10-CM

## 2011-06-17 ENCOUNTER — Ambulatory Visit
Admission: RE | Admit: 2011-06-17 | Discharge: 2011-06-17 | Disposition: A | Discharge status: Home / Self Care | Payer: Managed Care, Other (non HMO) | Source: Ambulatory Visit | Attending: Family Medicine | Admitting: Family Medicine

## 2011-06-17 DIAGNOSIS — R42 Dizziness and giddiness: Secondary | ICD-10-CM

## 2011-06-17 MED ORDER — GADOBENATE DIMEGLUMINE 529 MG/ML IV SOLN
20.0000 mL | Freq: Once | INTRAVENOUS | Status: AC | PRN
  Administered 2011-06-17: 20 mL via INTRAVENOUS

## 2011-10-21 ENCOUNTER — Other Ambulatory Visit: Payer: Self-pay | Admitting: Gastroenterology

## 2011-11-01 ENCOUNTER — Other Ambulatory Visit: Payer: Self-pay | Admitting: Gastroenterology

## 2011-11-03 ENCOUNTER — Ambulatory Visit
Admission: RE | Admit: 2011-11-03 | Discharge: 2011-11-03 | Disposition: A | Discharge status: Home / Self Care | Payer: Managed Care, Other (non HMO) | Source: Ambulatory Visit | Attending: Gastroenterology | Admitting: Gastroenterology

## 2011-11-03 MED ORDER — IOHEXOL 300 MG/ML  SOLN
125.0000 mL | Freq: Once | INTRAMUSCULAR | Status: AC | PRN
  Administered 2011-11-03: 125 mL via INTRAVENOUS

## 2012-03-09 ENCOUNTER — Encounter (HOSPITAL_COMMUNITY): Payer: Self-pay | Admitting: *Deleted

## 2012-03-09 ENCOUNTER — Ambulatory Visit (HOSPITAL_COMMUNITY)
Admission: RE | Admit: 2012-03-09 | Discharge: 2012-03-09 | Disposition: A | Discharge status: Home / Self Care | Payer: Managed Care, Other (non HMO) | Source: Other Acute Inpatient Hospital | Attending: Psychiatry | Admitting: Psychiatry

## 2012-03-09 DIAGNOSIS — F332 Major depressive disorder, recurrent severe without psychotic features: Secondary | ICD-10-CM | POA: Insufficient documentation

## 2012-03-09 HISTORY — DX: Herpesviral infection, unspecified: B00.9
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
## 2012-03-12 ENCOUNTER — Encounter (HOSPITAL_COMMUNITY): Payer: Self-pay

## 2012-03-12 ENCOUNTER — Other Ambulatory Visit (HOSPITAL_COMMUNITY): Payer: Managed Care, Other (non HMO) | Attending: Psychiatry

## 2012-03-12 DIAGNOSIS — Z630 Problems in relationship with spouse or partner: Secondary | ICD-10-CM | POA: Insufficient documentation

## 2012-03-12 DIAGNOSIS — K589 Irritable bowel syndrome without diarrhea: Secondary | ICD-10-CM | POA: Insufficient documentation

## 2012-03-12 DIAGNOSIS — F332 Major depressive disorder, recurrent severe without psychotic features: Secondary | ICD-10-CM | POA: Insufficient documentation

## 2012-03-12 DIAGNOSIS — B009 Herpesviral infection, unspecified: Secondary | ICD-10-CM | POA: Insufficient documentation

## 2012-03-12 DIAGNOSIS — F411 Generalized anxiety disorder: Secondary | ICD-10-CM | POA: Insufficient documentation

## 2012-03-12 DIAGNOSIS — K449 Diaphragmatic hernia without obstruction or gangrene: Secondary | ICD-10-CM | POA: Insufficient documentation

## 2012-03-12 DIAGNOSIS — Z7189 Other specified counseling: Secondary | ICD-10-CM | POA: Insufficient documentation

## 2012-03-12 MED ORDER — CITALOPRAM HYDROBROMIDE 10 MG PO TABS: 20.0000 mg | ORAL_TABLET | Freq: Every day | ORAL | Status: DC

## 2012-03-12 NOTE — Progress Notes (Signed)
    Daily Group Progress Note  Program: IOP  Group Time: 9:00-10:30 am   Participation Level: None  Behavioral Response: Not present  Type of Therapy:  Process Group  Summary of Progress: Patient was completing initial orientation.       Group Time: 10:30 am - 12:00 pm   Participation Level:  None  Behavioral Response: Not present  Type of Therapy: Psycho-education Group  Summary of Progress: Patient was meeting with the doctor   Maxcine Ham, MSW, LCSW
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
## 2012-03-13 ENCOUNTER — Other Ambulatory Visit (HOSPITAL_COMMUNITY): Payer: Managed Care, Other (non HMO)
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
## 2012-03-14 ENCOUNTER — Other Ambulatory Visit (HOSPITAL_COMMUNITY): Payer: Managed Care, Other (non HMO)

## 2012-03-14 NOTE — Progress Notes (Signed)
    Daily Group Progress Note  Program: IOP  Group Time: 9:00-10:30 am   Participation Level: Active  Behavioral Response: Appropriate  Type of Therapy:  Process Group  Summary of Progress: Patient at first appeared distant and disengaged. She stated she was in her head and focusing on her stressors. She was able to share with the group about the sadness and anger she feels towards her husband for his infidelity and frustration that she is in so much pain and he is not accepting any responsibility for his actions. She is struggling with her religious beliefs to forgive him and her anger towards him for the betrayal. She was tearful as she talked.      Group Time: 10:30 am - 12:00 pm   Participation Level:  Active  Behavioral Response: Appropriate  Type of Therapy: Psycho-education Group  Summary of Progress: Patient participated in a breathing technique to manage stress and learned how to use it to lower stress symptoms.   Maxcine Ham, MSW, LCSW

## 2012-03-15 ENCOUNTER — Other Ambulatory Visit (HOSPITAL_COMMUNITY): Payer: Managed Care, Other (non HMO)

## 2012-03-15 NOTE — Progress Notes (Signed)
    Daily Group Progress Note  Program: IOP  Group Time: 9:00-10:30 am   Participation Level: Active  Behavioral Response: Appropriate  Type of Therapy:  Process Group  Summary of Progress: Patient reports feeling "tired" today. She shared feeling overwhelmed with being the primary caretaker for her elderly father with dementia while trying to handle her husbands infidelity and how it feel like too much to handle at one time. She expressed feelings of guilt associated with not taking care of her father and also shared that she wishes she never would have learned about her husbands affair because she wishes her marriage could go back to the way it was. She did not to her assignment of self-care yesterday and states she had to take care of others who needed her. She is working on learning how to take care of her needs before being the caretaker of everyone else.      Group Time: 10:30 am - 12:00 pm   Participation Level:  Active  Behavioral Response: Appropriate  Type of Therapy: Psycho-education Group  Summary of Progress: Patient participated in a listening activity to promote effective listening skills to use in the group and to practice empathy as the group is learning how to connect with each other and form.   Maxcine Ham, MSW, LCSW

## 2012-03-16 ENCOUNTER — Other Ambulatory Visit (HOSPITAL_COMMUNITY): Payer: Managed Care, Other (non HMO)

## 2012-03-16 NOTE — Progress Notes (Signed)
    Daily Group Progress Note  Program: IOP  Group Time: 9:00-10:30 am   Participation Level: Active  Behavioral Response: Appropriate  Type of Therapy:  Process Group  Summary of Progress: Patient reports high depression today, but is not tearful for the first time and is leaning forward and actively engaged in the discussion and in listening. Members gave patient this feedback that she appears to be functioning at a higher level. Patient is exploring how her tendency to take care of others is having a negative impact on her wellness. Patient stated she started making time for herself last night and described how it lowered her stress level some to sit outside and pray and listen to nature.      Group Time: 10:30 am - 12:00 pm   Participation Level:  Active  Behavioral Response: Appropriate  Type of Therapy: Psycho-education Group  Summary of Progress: patient participated in an activity on how negative and positive events and emotions have an impact on our overall energy level and wellness and identified ways to recharge to replenish ourselves.   Maxcine Ham, MSW, LCSW

## 2012-03-19 ENCOUNTER — Other Ambulatory Visit (HOSPITAL_COMMUNITY): Payer: Managed Care, Other (non HMO)

## 2012-03-19 ENCOUNTER — Telehealth (HOSPITAL_COMMUNITY): Payer: Self-pay | Admitting: Psychiatry
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
## 2012-03-19 NOTE — Progress Notes (Signed)
    Daily Group Progress Note  Program: IOP  Group Time: 9:00-10:30 am   Participation Level: Active  Behavioral Response: Appropriate  Type of Therapy:  Process Group  Summary of Progress: Patient reports no progress with her depression and anxiety since starting the program. She states she can't begin to forgive her husband for his infidelity because he won't take ownership of his actions. When she talks about her husband she has a very calm and flat tone. She states she won't allow herself to get angry at him because "she doesn't like herself when she is angry". Patient was given feedback from members that if something does not change in her life she won't feel less depressed. Patient is not doing self care regularly, she continues to try to function and help others as if she is well and is not using her family or friends for support. Patient states if she could financially support herself, she would leave her husband, but she feels "trapped" to stay with him out of fear she won't be able to support herself. She also continues to be the primary caregiver for her aging father. She is working on learning how to give herself permission to take care of herself instead of focusing on the needs of others.      Group Time: 10:30 am - 12:00 pm   Participation Level:  Active  Behavioral Response: Appropriate  Type of Therapy: Psycho-education Group  Summary of Progress: Patient participated in a goodbye ceremony and was able to express words of goodbye to a member ended the program.    Maxcine Ham, MSW, LCSW

## 2012-03-20 ENCOUNTER — Other Ambulatory Visit (HOSPITAL_COMMUNITY): Payer: Managed Care, Other (non HMO)

## 2012-03-20 MED ORDER — BUPROPION HCL 100 MG PO TABS: 100.0000 mg | ORAL_TABLET | Freq: Two times a day (BID) | ORAL | Status: DC

## 2012-03-20 NOTE — Progress Notes (Signed)
    Daily Group Progress Note  Program: IOP  Group Time: 9:00-10:30 am    Participation Level: Active  Behavioral Response: Appropriate  Type of Therapy:  Process Group  Summary of Progress: Patient reports high depression with minimal reduction in symptoms. She is processesing her role as caretaker for others and trying to gain insgiht into how this is preventing her from taking care of herself and making progress with her depression. He admitted to denying the feelings of anger she has towards her husband for his infidelity because the fears becoming aggressive and states she does not know healthy ways to manage anger.     Group Time: 10:30 am - 12:00 pm   Participation Level:  Active  Behavioral Response: Appropriate  Type of Therapy: Psycho-education Group  Summary of Progress: Patient explored unhealthy rules she learned in childhood that affects her setting healthy limits with others such as "I must be the caretaker of others to have any self worth or value".  Maxcine Ham, MSW, LCSW

## 2012-03-20 NOTE — Progress Notes (Signed)
Patient ID: Jasmine Contreras, female   DOB: [birth year], 61 y.o.   MRN: [id number] Pt seen with [name] c/o of dizziness  . Wants to dc celexa. Will dc celexa . BP was 110/76 sitting and 120/80 standing . Pt referred to her pcp. Discussed R/R/B/O of wellbutrin and pt gave informed consent. Start wellbutrin 50 mg q am

## 2012-03-21 ENCOUNTER — Other Ambulatory Visit (HOSPITAL_COMMUNITY): Payer: Managed Care, Other (non HMO)

## 2012-03-22 ENCOUNTER — Other Ambulatory Visit (HOSPITAL_COMMUNITY): Payer: Managed Care, Other (non HMO)

## 2012-03-22 NOTE — Progress Notes (Signed)
    Daily Group Progress Note  Program: IOP  Group Time: 9:00-10:30 am   Participation Level: Active  Behavioral Response: Appropriate  Type of Therapy:  Process Group  Summary of Progress: Patient reports feeling less depressed. She talked of taking control of her life in ways she can and described how she plans on walking every day to do self-care. She states she still does not know what she will do about her husband, but is feeling better focusing her needs.      Group Time: 10:30 am - 12:00 pm   Participation Level:  Active  Behavioral Response: Appropriate  Type of Therapy: Psycho-education Group  Summary of Progress: Patient participated in a discussion on how to set healthy boundaries and barriers to limit setting.   Maxcine Ham, MSW, LCSW

## 2012-03-23 ENCOUNTER — Other Ambulatory Visit (HOSPITAL_COMMUNITY): Payer: Managed Care, Other (non HMO)

## 2012-03-23 NOTE — Progress Notes (Signed)
Patient ID: Jasmine Contreras, female   DOB: 25-Oct-1950, 61 y.o.   MRN: [id number] Pt seen , has not started her wellbutrin due to no money. Has insomnia and is worried about her marital situation. No Si/ Hi. No hall/ delusions. Pt will start wellbutrin in am and will take Benadryl 25 mg OTC for sleep.

## 2012-03-23 NOTE — Progress Notes (Signed)
    Daily Group Progress Note  Program: IOP  Group Time: 9:00-10:30 am   Participation Level: Active  Behavioral Response: Appropriate  Type of Therapy:  Process Group  Summary of Progress: Patient continues to make progress with her depression and is talking more and opening up more. She shared that she felt discouraged after trying to take a day for herself to practice self care and having a panic attack in the mall. Members helped her reframe the experiences as patient struggling with guilt over taking time for her self, which patient agreed was happening. Patient appeared more hopeful after this discussion and talked about how difficult it is for her to practice doing things for herself after so many years of taking care of others.      Group Time: 10:30 am - 12:00 pm   Participation Level:  Active  Behavioral Response: Appropriate  Type of Therapy: Psycho-education Group  Summary of Progress: Patient participated in a discussion that has continued regarding setting healthy boundaries. Patient shared a current stressors and reframed it through the lens of how to set a healthy limit by receiving feedback from other members.    Maxcine Ham, MSW, LCSW

## 2012-03-23 NOTE — Progress Notes (Signed)
    Daily Group Progress Note  Program: IOP  Group Time: 9:00-10:30 am   Participation Level: Active  Behavioral Response: Appropriate  Type of Therapy:  Process Group  Summary of Progress: Patient reports feeling "good" today. She was smiling as she described how she practiced self-care again yesterday by going to the mall and having her makeup done. She described how she has plans to reinvent herself and start learning how to make her needs a priority. She states she has been actively exploring options regarding her marriage and has plans to set some limits with her husband and ask him to move out and continue paying for her bills and rent.      Group Time: 10:30 am - 12:00 pm   Participation Level:  Active  Behavioral Response: Appropriate  Type of Therapy: Psycho-education Group  Summary of Progress: Patient participated in listening to a podcast on "Mindfulness" and how stressful thoughts feed anxiety. A discussion followed where member was able to express how her thoughts also feed her stress.  Maxcine Ham, MSW, LCSW

## 2012-03-26 ENCOUNTER — Other Ambulatory Visit (HOSPITAL_COMMUNITY): Payer: Managed Care, Other (non HMO)

## 2012-03-26 NOTE — Progress Notes (Signed)
    Daily Group Progress Note  Program: IOP  Group Time: 9:00-10:30 am   Participation Level: Active  Behavioral Response: Appropriate  Type of Therapy:  Process Group  Summary of Progress: Patient reports feeling high depression and anger today. She talked about being angry with her husband for his infidelity and how she will forever have an illness because of him. She processed fear of not finding another person who will accept her now that she has this illness. She described feeling "trapped" to stay with her husband for this reason. Members gave patient support for allowing herself permission to feel angry. Patient states she is trying to accept the anger as a part of her healing process.      Group Time: 10:30 am - 12:00 pm   Participation Level:  Active  Behavioral Response: Appropriate  Type of Therapy: Psycho-education Group  Summary of Progress: Patient participated in a goodbye ceremony and then learned about healthy caring vs over caring and how to tell the difference between the two to be emotionally healthy.    Maxcine Ham, MSW, LCSW

## 2012-03-27 ENCOUNTER — Other Ambulatory Visit (HOSPITAL_COMMUNITY): Payer: Managed Care, Other (non HMO)

## 2012-03-27 NOTE — Progress Notes (Signed)
    Daily Group Progress Note  Program: IOP  Group Time: 9:00-10:30 am   Participation Level: Active  Behavioral Response: Appropriate  Type of Therapy:  Process Group  Summary of Progress: Patient is aggravated today and struggled with being appropriate with others due to her high level of annoyance. She responded well when given feedback in this area. She stated she is struggling with anger and sadness over her husbands infidelity and is finally allowing herself to feel the emotions without wearing the mask. Her discharge is scheduled for Friday and she plans on returning to her therapist to begin doing some individual work in this area now that she is open to accessing her feelings.      Group Time: 10:30 am - 12:00 pm  Participation Level:  Active  Behavioral Response: Appropriate  Type of Therapy: Psycho-education Group  Summary of Progress: Patient participated in a discussion and education segment on how "over care" affects stress and depression and identified she has a history of taking on more than she can handle and how this has contributed to her depression.   Maxcine Ham, MSW, LCSW

## 2012-03-28 ENCOUNTER — Other Ambulatory Visit (HOSPITAL_COMMUNITY): Payer: Managed Care, Other (non HMO)

## 2012-03-29 ENCOUNTER — Other Ambulatory Visit (HOSPITAL_COMMUNITY): Payer: Managed Care, Other (non HMO)

## 2012-03-30 ENCOUNTER — Other Ambulatory Visit (HOSPITAL_COMMUNITY): Payer: Managed Care, Other (non HMO)

## 2012-03-30 NOTE — Progress Notes (Signed)
    Daily Group Progress Note  Program: IOP  Group Time: 9:00-10:30 am   Participation Level: Active  Behavioral Response: Appropriate  Type of Therapy:  Process Group  Summary of Progress: Patient reports feeling ready to end the group today and reports feeling "ok" today. She states she has learned a lot about herself in how she focuses on taking care of the needs of others before herself and how she struggles with using communication to express painful feelings and how she would like to focus on that in individual therapy after ending the program. She states she learned as a child to hold in negative feelings so she never learned affective ways to share them and would like to learn healthy ways to do that.      Group Time: 10:30 am - 12:00 pm   Participation Level:  Active  Behavioral Response: Appropriate  Type of Therapy: Psycho-education Group  Summary of Progress: Patient participated in a goodbye ceremony and practiced skills of closure and expressing painful feelings associated with loss.   Maxcine Ham, MSW, LCSW

## 2012-03-30 NOTE — Patient Instructions (Addendum)
Patient completed MH-IOP today.  Will follow up with Jake Seats, NP 04-05-12 @ 11:45 a.m, and Lyn Norris, LCSW  04-06-12 @ 10:00 a.m..  Encouraged support groups.

## 2012-03-30 NOTE — Progress Notes (Signed)
Patient ID: Jasmine Contreras, female   DOB: 1951-05-21, 61 y.o.   MRN: [id number] D:  This is a 31 married african [place] female, who was referred per Higinio Plan, LCSW, treatment for worsening depressive and anxiety symptoms.  Denies any SI, HI, or A/V hallucinations.  Reports improved sleep.  Continues to struggle with anxiety, depression, poor appetite, concentration, indecisiveness, and irritability.  States the groups were helpful with learning coping skills.  "I'm trying to cope with daily life and changes.  A:  D/C today.  F/U with Lyn Norris, LCSW on 04-06-12 @ 10:00 a.m and Jake Seats, NP on 04-05-12 at 11:45 a.m..  Encouraged support groups.  RTW date is 04-09-12.  R:  Pt receptive.

## 2012-03-30 NOTE — Progress Notes (Incomplete)
  [facility] Intensive Outpatient Program Discharge Summary  [facility] [place] [id number]  Discharge Note  Patient:  Jasmine Contreras is an 61 y.o., female DOB:  1950/12/19  Date of Admission:  03/12/12  Date of Discharge 03/30/12  Reason for Admission: Depression and anxiety  Hospital Course: Pt was admitted   Mental Status at Discharge:  Lab Results: No results found for this or any previous visit (from the past 48 hour(s)).  Current outpatient prescriptions:buPROPion (WELLBUTRIN) 100 MG tablet, Take 1 tablet (100 mg total) by mouth 2 (two) times daily., Disp: 90 tablet, Rfl: 0;  folic acid (FOLVITE) 400 MCG tablet, Take 400 mcg by mouth daily.  , Disp: , Rfl: ;  furosemide (LASIX) 20 MG tablet, Take 20 mg by mouth 2 (two) times daily.  , Disp: , Rfl:  hydroxypropyl methylcellulose (ISOPTO TEARS) 2.5 % ophthalmic solution, Place 2 drops into both eyes 3 (three) times daily as needed. For dry eyes , Disp: , Rfl: ;  hyoscyamine (LEVSIN SL) 0.125 MG SL tablet, Place 0.125 mg under the tongue every 4 (four) hours as needed., Disp: , Rfl: ;  OVER THE COUNTER MEDICATION, Take 1 tablet by mouth daily. Biotin forte , Disp: , Rfl:  potassium chloride SA (K-DUR,KLOR-CON) 20 MEQ tablet, Take 40 mEq by mouth 2 (two) times daily.  , Disp: , Rfl: ;  PRESCRIPTION MEDICATION, Apply 1 application topically daily. Cream for hot flashes. Pt unsure of name. [facility]. , Disp: , Rfl: ;  vitamin B-12 (CYANOCOBALAMIN) 1000 MCG tablet, Take 1,000 mcg by mouth daily.  , Disp: , Rfl:   Axis Diagnosis:  {Axis Diagnosis:3049000}   Level of Care:  {LEVEL OF JXBJ:47829}  Discharge destination:  {DISCHARGE DESTINATION:22616}  Is patient on multiple antipsychotic therapies at discharge:  {RECOMMEND TAPERING:22617}    Has Patient had three or more failed trials of antipsychotic monotherapy by history:  {BHH YES OR FA:21308}  Patient phone:  [phone] (home)  Patient address:    [address] [place] [address],   Follow-up recommendations:  {B[facility] DC FU RECOMMENDATIONS:22620}  Comments:  ***  The patient received suicide prevention pamphlet:  {BHH YES OR NO:22294} Belongings returned:  {BHH BELONGINGS RETURNED:22619}  Margit Banda 03/30/2012, 12:21 PM

## 2012-03-30 NOTE — Progress Notes (Signed)
    Daily Group Progress Note  Program: IOP  Group Time: 9:00-10:30 am   Participation Level: Active  Behavioral Response: Appropriate  Type of Therapy:  Process Group  Summary of Progress: Patient reports feeling "good" today. She is still trying to process grief and anger towards her husband's infidelity and is learning how to express those emotions and ensure self-care.     Group Time: 10:30 am - 12:00 pm   Participation Level:  Active  Behavioral Response: Appropriate  Type of Therapy: Psycho-education Group  Summary of Progress: Patient participated in a goodbye ceremony and practiced skills of healthy closure.  Maxcine Ham, MSW, LCSW

## 2012-04-02 ENCOUNTER — Other Ambulatory Visit (HOSPITAL_COMMUNITY): Payer: Managed Care, Other (non HMO)

## 2012-04-03 ENCOUNTER — Other Ambulatory Visit (HOSPITAL_COMMUNITY): Payer: Managed Care, Other (non HMO)

## 2012-04-04 ENCOUNTER — Other Ambulatory Visit (HOSPITAL_COMMUNITY): Payer: Managed Care, Other (non HMO)

## 2012-04-05 ENCOUNTER — Other Ambulatory Visit (HOSPITAL_COMMUNITY): Payer: Managed Care, Other (non HMO)

## 2012-04-06 ENCOUNTER — Other Ambulatory Visit (HOSPITAL_COMMUNITY): Payer: Managed Care, Other (non HMO)

## 2012-04-09 ENCOUNTER — Other Ambulatory Visit (HOSPITAL_COMMUNITY): Payer: Managed Care, Other (non HMO)

## 2012-04-10 ENCOUNTER — Other Ambulatory Visit (HOSPITAL_COMMUNITY): Payer: Managed Care, Other (non HMO)

## 2012-04-11 ENCOUNTER — Other Ambulatory Visit (HOSPITAL_COMMUNITY): Payer: Managed Care, Other (non HMO)

## 2012-04-12 ENCOUNTER — Other Ambulatory Visit (HOSPITAL_COMMUNITY): Payer: Managed Care, Other (non HMO)

## 2012-04-13 ENCOUNTER — Other Ambulatory Visit (HOSPITAL_COMMUNITY): Payer: Managed Care, Other (non HMO)

## 2012-04-16 ENCOUNTER — Other Ambulatory Visit (HOSPITAL_COMMUNITY): Payer: Managed Care, Other (non HMO)

## 2012-08-09 ENCOUNTER — Emergency Department (HOSPITAL_COMMUNITY)
Admission: EM | Admit: 2012-08-09 | Discharge: 2012-08-09 | Disposition: A | Discharge status: Home / Self Care | Payer: Managed Care, Other (non HMO) | Attending: Emergency Medicine | Admitting: Emergency Medicine

## 2012-08-09 ENCOUNTER — Encounter (HOSPITAL_COMMUNITY): Payer: Self-pay | Admitting: *Deleted

## 2012-08-09 ENCOUNTER — Emergency Department (HOSPITAL_COMMUNITY): Payer: Managed Care, Other (non HMO)

## 2012-08-09 DIAGNOSIS — E8779 Other fluid overload: Secondary | ICD-10-CM | POA: Insufficient documentation

## 2012-08-09 DIAGNOSIS — K449 Diaphragmatic hernia without obstruction or gangrene: Secondary | ICD-10-CM | POA: Insufficient documentation

## 2012-08-09 DIAGNOSIS — F411 Generalized anxiety disorder: Secondary | ICD-10-CM | POA: Insufficient documentation

## 2012-08-09 DIAGNOSIS — R079 Chest pain, unspecified: Secondary | ICD-10-CM | POA: Insufficient documentation

## 2012-08-09 DIAGNOSIS — F329 Major depressive disorder, single episode, unspecified: Secondary | ICD-10-CM | POA: Insufficient documentation

## 2012-08-09 DIAGNOSIS — R1012 Left upper quadrant pain: Secondary | ICD-10-CM | POA: Insufficient documentation

## 2012-08-09 DIAGNOSIS — F3289 Other specified depressive episodes: Secondary | ICD-10-CM | POA: Insufficient documentation

## 2012-08-09 DIAGNOSIS — R42 Dizziness and giddiness: Secondary | ICD-10-CM | POA: Insufficient documentation

## 2012-08-09 DIAGNOSIS — B029 Zoster without complications: Secondary | ICD-10-CM | POA: Insufficient documentation

## 2012-08-09 DIAGNOSIS — K589 Irritable bowel syndrome without diarrhea: Secondary | ICD-10-CM | POA: Insufficient documentation

## 2012-08-09 DIAGNOSIS — R110 Nausea: Secondary | ICD-10-CM | POA: Insufficient documentation

## 2012-08-09 DIAGNOSIS — R011 Cardiac murmur, unspecified: Secondary | ICD-10-CM | POA: Insufficient documentation

## 2012-08-09 LAB — CBC WITH DIFFERENTIAL/PLATELET
Lymphocytes Relative: 42 % (ref 12–46)
Lymphs Abs: 2.5 10*3/uL (ref 0.7–4.0)
Neutrophils Relative %: 49 % (ref 43–77)
Platelets: 249 10*3/uL (ref 150–400)
RBC: 4.27 MIL/uL (ref 3.87–5.11)
WBC: 5.9 10*3/uL (ref 4.0–10.5)

## 2012-08-09 LAB — HEPATIC FUNCTION PANEL
ALT: 15 U/L (ref 0–35)
Albumin: 3.1 g/dL — ABNORMAL LOW (ref 3.5–5.2)
Bilirubin, Direct: 0.1 mg/dL (ref 0.0–0.3)
Indirect Bilirubin: 0.5 mg/dL (ref 0.3–0.9)
Total Bilirubin: 0.6 mg/dL (ref 0.3–1.2)
Total Protein: 6.8 g/dL (ref 6.0–8.3)

## 2012-08-09 LAB — BASIC METABOLIC PANEL
BUN: 11 mg/dL (ref 6–23)
CO2: 23 mEq/L (ref 19–32)
Chloride: 104 mEq/L (ref 96–112)
GFR calc non Af Amer: >90 mL/min (ref >90)
Glucose, Bld: 98 mg/dL (ref 70–99)
Potassium: 3.6 mEq/L (ref 3.5–5.1)
Sodium: 137 mEq/L (ref 135–145)

## 2012-08-09 LAB — URINALYSIS, ROUTINE W REFLEX MICROSCOPIC
Glucose, UA: NEGATIVE mg/dL
Nitrite: NEGATIVE
Protein, ur: NEGATIVE mg/dL
pH: 6 (ref 5.0–8.0)

## 2012-08-09 LAB — POCT I-STAT TROPONIN I: Troponin i, poc: 0.01 ng/mL (ref 0.00–0.08)

## 2012-08-09 LAB — TROPONIN I: Troponin I: <0.3 ng/mL (ref <0.30)

## 2012-08-09 MED ORDER — ASPIRIN 81 MG PO CHEW
162.0000 mg | CHEWABLE_TABLET | Freq: Once | ORAL | Status: AC
  Administered 2012-08-09: 162 mg via ORAL
  Filled 2012-08-09: qty 2

## 2012-08-09 MED ORDER — IBUPROFEN 200 MG PO TABS
600.0000 mg | ORAL_TABLET | Freq: Once | ORAL | Status: AC
  Administered 2012-08-09: 600 mg via ORAL
  Filled 2012-08-09: qty 3

## 2012-08-09 MED ORDER — ONDANSETRON HCL 4 MG/2ML IJ SOLN
4.0000 mg | Freq: Once | INTRAMUSCULAR | Status: AC
  Administered 2012-08-09: 4 mg via INTRAVENOUS
  Filled 2012-08-09: qty 2

## 2012-08-09 MED ORDER — SODIUM CHLORIDE 0.9 % IV SOLN
Freq: Once | INTRAVENOUS | Status: AC
  Administered 2012-08-09: 09:00:00 via INTRAVENOUS

## 2012-08-09 MED ORDER — NITROGLYCERIN 0.4 MG SL SUBL
0.4000 mg | SUBLINGUAL_TABLET | SUBLINGUAL | Status: DC | PRN
  Administered 2012-08-09 (×2): 0.4 mg via SUBLINGUAL
  Filled 2012-08-09: qty 25

## 2012-08-09 NOTE — Progress Notes (Signed)
pcp per pt is Jasmine Contreras epic updated

## 2012-08-09 NOTE — ED Notes (Signed)
MD at bedside. 

## 2012-08-09 NOTE — ED Notes (Signed)
XR at bedside

## 2012-08-09 NOTE — ED Provider Notes (Addendum)
History     CSN: [id number]  Arrival date & time 08/09/12  8657   First MD Initiated Contact with Patient 08/09/12 [phone]      Chief Complaint  Patient presents with  . Chest Pain  . Abdominal Pain    (Consider location/radiation/quality/duration/timing/severity/associated sxs/prior treatment) HPI Comments: Pt comes in with cc of chest pain, abdominal pain. Pt reports that she was heading to work, when she started having the abdominal discomfort and chest pressure. The pain is located in the epigastrium and left side of her chest. The discomfort feels pressure like, with associated sob and nausea. She denies any specific precipitating, aggravating or relieving factors. She reports having a cath done in 2005 - that was negative and also reports having a mild CHF.  Patient is a 62 y.o. female presenting with chest pain and abdominal pain. The history is provided by the patient and medical records.  Chest Pain Primary symptoms include shortness of breath, abdominal pain and nausea. Pertinent negatives for primary symptoms include no cough, no wheezing and no vomiting.    Abdominal Pain The primary symptoms of the illness include abdominal pain, shortness of breath and nausea. The primary symptoms of the illness do not include vomiting or diarrhea.  Symptoms associated with the illness do not include constipation or hematuria.    Past Medical History  Diagnosis Date  . Herpes   . Depression   . Anxiety   . Retaining fluid   . Heart murmur   . Irritable bowel syndrome   . Hiatal hernia     Past Surgical History  Procedure Date  . Cholecystectomy   . Vaginal hysterectomy     Family History  Problem Relation Age of Onset  . Alcohol abuse Father     History  Substance Use Topics  . Smoking status: Never Smoker   . Smokeless tobacco: Not on file  . Alcohol Use: No    OB History    Grav Para Term Preterm Abortions TAB SAB Ect Mult Living                  Review  of Systems  Constitutional: Negative for activity change.  HENT: Negative for facial swelling and neck pain.   Respiratory: Positive for shortness of breath. Negative for cough and wheezing.   Cardiovascular: Positive for chest pain.  Gastrointestinal: Positive for nausea and abdominal pain. Negative for vomiting, diarrhea, constipation, blood in stool and abdominal distention.  Genitourinary: Negative for hematuria and difficulty urinating.  Skin: Negative for color change.  Neurological: Negative for speech difficulty.  Hematological: Does not bruise/bleed easily.  Psychiatric/Behavioral: Negative for confusion.    Allergies  Codeine; Hydrocodone; Oxycodone hcl er; Shellfish allergy; and Tavist-d  Home Medications   Current Outpatient Rx  Name  Route  Sig  Dispense  Refill  . BUPROPION HCL 100 MG PO TABS   Oral   Take 1 tablet (100 mg total) by mouth 2 (two) times daily.   90 tablet   0   . FOLIC ACID 400 MCG PO TABS   Oral   Take 400 mcg by mouth daily.           . FUROSEMIDE 20 MG PO TABS   Oral   Take 20 mg by mouth 2 (two) times daily.           Marland Kitchen HYPROMELLOSE 2.5 % OP SOLN   Both Eyes   Place 2 drops into both eyes 3 (three) times daily as  needed. For dry eyes          . HYOSCYAMINE SULFATE 0.125 MG SL SUBL   Sublingual   Place 0.125 mg under the tongue every 4 (four) hours as needed.         Marland Kitchen OVER THE COUNTER MEDICATION   Oral   Take 1 tablet by mouth daily. Biotin forte          . POTASSIUM CHLORIDE CRYS ER 20 MEQ PO TBCR   Oral   Take 40 mEq by mouth 2 (two) times daily.           Marland Kitchen PRESCRIPTION MEDICATION   Topical   Apply 1 application topically daily. Cream for hot flashes. Pt unsure of name. [facility] at [facility].          Marland Kitchen VITAMIN B-12 1000 MCG PO TABS   Oral   Take 1,000 mcg by mouth daily.             BP 142/72  Pulse 62  Temp 97.9 F (36.6 C) (Oral)  Resp 18  SpO2 100%  Physical Exam  Nursing note and  vitals reviewed. Constitutional: She is oriented to person, place, and time. She appears well-developed.  HENT:  Head: Normocephalic and atraumatic.  Eyes: Conjunctivae normal and EOM are normal. Pupils are equal, round, and reactive to light.  Neck: Normal range of motion. Neck supple. No JVD present.  Cardiovascular: Normal rate, regular rhythm and normal heart sounds.   Pulmonary/Chest: Effort normal and breath sounds normal. No respiratory distress.  Abdominal: Soft. Bowel sounds are normal. She exhibits no distension. There is tenderness. There is no rebound and no guarding.       Diffuse abd tenderness, worst on the LUQ, with no rebound or guarding.  Neurological: She is alert and oriented to person, place, and time.  Skin: Skin is warm and dry.    ED Course  Procedures (including critical care time)   Labs Reviewed  CBC WITH DIFFERENTIAL  BASIC METABOLIC PANEL  TROPONIN I  URINALYSIS, ROUTINE W REFLEX MICROSCOPIC  HEPATIC FUNCTION PANEL  LIPASE, BLOOD   No results found.   No diagnosis found.    MDM   Date: 08/09/2012  Rate: 72  Rhythm: normal sinus rhythm  QRS Axis: normal  Intervals: normal  ST/T Wave abnormalities: normal  Conduction Disutrbances: none  Narrative Interpretation: unremarkable  Differential diagnosis includes: ACS syndrome CHF exacerbation Valvular disorder Dissection Pericarditis Pericardial effusion Pneumonia PE Anemia Musculoskeletal pain Pancreatitis Gastritis  Pt comes in with cc of abd pain and chest pain. Outside of age, patient has no other cardiac risk factor. The pain is left sided, and typical in character - but atypical in that there pain goes all the way down to the Umbilicus. GI exam was non peritoneal - and no concerns for obstruction at this time or perforated viscus.  Pt will need at least ACS ED workup. She has a PCP, and the PCP had just discussed getting outpatient Cardiology appt.  I feel like if the trops x  2 are negative, and the EKG is not showing any dynamic changed, and the pain ceases in the ED, we can certainly consider outpatient close Cardiology f.u - otherwise, admission will be considered.      Derwood Kaplan, MD 08/09/12 0841  12:50 PM Pt has an appt with Dr. Mayford Knife tomorrow, Cardiology. Will d.c  Derwood Kaplan, MD 08/09/12 1251

## 2012-08-09 NOTE — ED Notes (Signed)
Pt sts that chest pain is increasing, but refuses Nitro.

## 2012-08-09 NOTE — ED Notes (Signed)
Pt reports abdominal pain that started less than 30 min ago with left sided chest pain 5/10. Nausea present. Pt reports chest pain has subsided at present, feels chest "heaviness". Reports some dizziness when standing.

## 2012-08-09 NOTE — ED Notes (Signed)
Pt c/o chest pain and abdominal x .  Sts she had similar pain x 3 yrs ago.  Dx with a hernia, but did not follow up with a MD.

## 2012-08-09 NOTE — ED Notes (Signed)
Pt alert and oriented x4. Respirations even and unlabored. Bilateral rise and fall of chest. Skin warm and dry. In no acute distress. Denies needs.  Pt verbalized understanding that she has a follow up appointment tomorrow.

## 2012-08-10 ENCOUNTER — Encounter (HOSPITAL_COMMUNITY): Payer: Self-pay | Admitting: Emergency Medicine

## 2012-08-10 ENCOUNTER — Emergency Department (HOSPITAL_COMMUNITY)
Admission: EM | Admit: 2012-08-10 | Discharge: 2012-08-11 | Disposition: A | Discharge status: Home / Self Care | Payer: Managed Care, Other (non HMO) | Attending: Emergency Medicine | Admitting: Emergency Medicine

## 2012-08-10 ENCOUNTER — Emergency Department (HOSPITAL_COMMUNITY): Payer: Managed Care, Other (non HMO)

## 2012-08-10 DIAGNOSIS — Z8639 Personal history of other endocrine, nutritional and metabolic disease: Secondary | ICD-10-CM | POA: Insufficient documentation

## 2012-08-10 DIAGNOSIS — Z862 Personal history of diseases of the blood and blood-forming organs and certain disorders involving the immune mechanism: Secondary | ICD-10-CM | POA: Insufficient documentation

## 2012-08-10 DIAGNOSIS — R0789 Other chest pain: Secondary | ICD-10-CM | POA: Insufficient documentation

## 2012-08-10 DIAGNOSIS — Z8619 Personal history of other infectious and parasitic diseases: Secondary | ICD-10-CM | POA: Insufficient documentation

## 2012-08-10 DIAGNOSIS — R059 Cough, unspecified: Secondary | ICD-10-CM | POA: Insufficient documentation

## 2012-08-10 DIAGNOSIS — Z8659 Personal history of other mental and behavioral disorders: Secondary | ICD-10-CM | POA: Insufficient documentation

## 2012-08-10 DIAGNOSIS — R011 Cardiac murmur, unspecified: Secondary | ICD-10-CM | POA: Insufficient documentation

## 2012-08-10 DIAGNOSIS — Z79899 Other long term (current) drug therapy: Secondary | ICD-10-CM | POA: Insufficient documentation

## 2012-08-10 DIAGNOSIS — R05 Cough: Secondary | ICD-10-CM | POA: Insufficient documentation

## 2012-08-10 DIAGNOSIS — Z8719 Personal history of other diseases of the digestive system: Secondary | ICD-10-CM | POA: Insufficient documentation

## 2012-08-10 LAB — CBC
HCT: 34 % — ABNORMAL LOW (ref 36.0–46.0)
Hemoglobin: 11.1 g/dL — ABNORMAL LOW (ref 12.0–15.0)
MCH: 26.4 pg (ref 26.0–34.0)
MCHC: 32.6 g/dL (ref 30.0–36.0)
RBC: 4.21 MIL/uL (ref 3.87–5.11)

## 2012-08-10 LAB — BASIC METABOLIC PANEL
BUN: 10 mg/dL (ref 6–23)
CO2: 24 mEq/L (ref 19–32)
Calcium: 8.6 mg/dL (ref 8.4–10.5)
GFR calc non Af Amer: >90 mL/min (ref >90)
Glucose, Bld: 120 mg/dL — ABNORMAL HIGH (ref 70–99)
Potassium: 3.6 mEq/L (ref 3.5–5.1)

## 2012-08-10 LAB — POCT I-STAT TROPONIN I: Troponin i, poc: 0 ng/mL (ref 0.00–0.08)

## 2012-08-10 NOTE — ED Notes (Signed)
Pt called by MD to come to ED for chest CT to rule out PE. Pt experiencing chest pain L sided, non radiating onset yesterday. Pt seen here yesterday for same.

## 2012-08-10 NOTE — ED Notes (Signed)
Pt sent by PCP for Chest CT to rule out PE. Pt had positive d-dimer in office today and negative troponin.

## 2012-08-10 NOTE — ED Provider Notes (Signed)
History    CSN: [id number] Arrival date & time 08/10/12  2108First MD Initiated Contact with Patient 08/10/12 2224     Chief Complaint  Patient presents with  . Chest Pain    HPI Comments: Pt had an episode of chest pain on Thursday am on the way to work. The pain was intense.  She was concerned that she was having a heart attack.  It was a tightening and pressure sensation on the left chest that was severe..  It lasted for approx 10 minutes.  It eased up on its own.  She has not had anything since although she has had some mild pressure.  Patient is a 62 y.o. female presenting with chest pain.  Chest Pain The pain does not radiate. Primary symptoms include cough (every night when in bed). Pertinent negatives for primary symptoms include no fever and no shortness of breath.  Pertinent negatives for past medical history include no CAD, no MI and no PE.   Pt went to Dr. Norris [place] office in follow up.  She had blood tests and was called today and told to come to the ED to make sure she does not have a PE.  Past Medical History  Diagnosis Date  . Herpes   . Depression   . Anxiety   . Retaining fluid   . Heart murmur   . Irritable bowel syndrome   . Hiatal hernia     Past Surgical History  Procedure Date  . Cholecystectomy   . Vaginal hysterectomy     Family History  Problem Relation Age of Onset  . Alcohol abuse Father     History  Substance Use Topics  . Smoking status: Never Smoker   . Smokeless tobacco: Not on file  . Alcohol Use: No    OB History    Grav Para Term Preterm Abortions TAB SAB Ect Mult Living                  Review of Systems  Constitutional: Negative for fever.  Respiratory: Positive for cough (every night when in bed). Negative for shortness of breath.   Cardiovascular: Positive for chest pain.    Allergies  Codeine; Hydrocodone; Oxycodone hcl er; Shellfish allergy; and Tavist-d  Home Medications   Current Outpatient Rx  Name  Route  Sig   Dispense  Refill  . BIOTIN FORTE PO   Oral   Take 1 capsule by mouth daily.         . COLCHICINE 0.6 MG PO TABS   Oral   Take 0.6 mg by mouth as needed. For gout flare ups         . ESOMEPRAZOLE MAGNESIUM 20 MG PO CPDR   Oral   Take 20 mg by mouth daily before breakfast.         . FUROSEMIDE 20 MG PO TABS   Oral   Take 20 mg by mouth 2 (two) times daily.           Marland Kitchen GARCINIA CAMBOGIA-CHROMIUM 500-200 MG-MCG PO TABS   Oral   Take 1 tablet by mouth 2 (two) times daily.         Marland Kitchen HYPROMELLOSE 2.5 % OP SOLN   Both Eyes   Place 2 drops into both eyes 3 (three) times daily as needed. For dry eyes          . HYOSCYAMINE SULFATE 0.125 MG SL SUBL   Sublingual   Place 0.125 mg under the tongue every 4 (  four) hours as needed. Stomach spasms         . HORNY GOAT WEED PO   Oral   Take 1 tablet by mouth daily.         Marland Kitchen PROGESTERONE EX   Apply externally   Apply 1 application topically as directed. Small amount to both thighs twice daily to prevent hot flashes         . ALIVE WOMENS 50+ PO   Oral   Take 2 tablets by mouth daily.         Marland Kitchen POTASSIUM CHLORIDE CRYS ER 20 MEQ PO TBCR   Oral   Take 40 mEq by mouth 2 (two) times daily.           Marland Kitchen VITAMIN B-12 1000 MCG PO TABS   Oral   Take 1,000 mcg by mouth daily.             BP 133/76  Pulse 60  Temp 98.1 F (36.7 C) (Oral)  Resp 18  SpO2 100%  Physical Exam  Nursing note and vitals reviewed. Constitutional: She appears well-developed and well-nourished. No distress.  HENT:  Head: Normocephalic and atraumatic.  Right Ear: External ear normal.  Left Ear: External ear normal.  Eyes: Conjunctivae normal are normal. Right eye exhibits no discharge. Left eye exhibits no discharge. No scleral icterus.  Neck: Neck supple. No tracheal deviation present.  Cardiovascular: Normal rate, regular rhythm and intact distal pulses.   Pulmonary/Chest: Effort normal and breath sounds normal. No stridor. No  respiratory distress. She has no wheezes. She has no rales.  Abdominal: Soft. Bowel sounds are normal. She exhibits no distension. There is no tenderness. There is no rebound and no guarding.  Musculoskeletal: She exhibits no edema and no tenderness.  Neurological: She is alert. She has normal strength. No sensory deficit. Cranial nerve deficit:  no gross defecits noted. She exhibits normal muscle tone. She displays no seizure activity. Coordination normal.  Skin: Skin is warm and dry. No rash noted.  Psychiatric: She has a normal mood and affect.    ED Course  Procedures (including critical care time)  Rate: 62  Rhythm: normal sinus rhythm  QRS Axis: normal  Intervals: normal  ST/T Wave abnormalities: normal  Conduction Disutrbances:none  Narrative Interpretation: left atrial abnormality  Old EKG Reviewed: none available  Labs Reviewed  CBC - Abnormal; Notable for the following:    Hemoglobin 11.1 (*)     HCT 34.0 (*)     RDW 15.6 (*)     All other components within normal limits  APTT  POCT I-STAT TROPONIN I  BASIC METABOLIC PANEL   Dg Chest Port 1 View  08/09/2012  *RADIOLOGY REPORT*  Clinical Data: Chest pain and shortness of breath.  PORTABLE CHEST - 1 VIEW  Comparison: 08/06/2012.  Findings: The heart is borderline enlarged but stable.  The mediastinal and hilar contours are unchanged.  Interval development of vascular congestion and interstitial pulmonary edema.  No definite pleural effusions.  IMPRESSION: Interval development of mild CHF.   Original Report Authenticated By: Rudie Meyer, M.D.       MDM  Pt had a BMET yesterday which was normal.  Will proceed with chest CT.  Dr. Dierdre Highman will follow up on chest ct.  If negative pt can be discharged home.  She had seen Dr Mayford Knife in the office today.  Can follow up as planned with her.        Celene Kras, MD 08/12/12  2159 

## 2012-08-11 ENCOUNTER — Emergency Department (HOSPITAL_COMMUNITY): Payer: Managed Care, Other (non HMO)

## 2012-08-11 MED ORDER — IOHEXOL 350 MG/ML SOLN: 100.0000 mL | Freq: Once | INTRAVENOUS | Status: DC | PRN

## 2012-08-24 ENCOUNTER — Other Ambulatory Visit: Payer: Self-pay | Admitting: Cardiology

## 2012-08-24 ENCOUNTER — Encounter: Payer: Self-pay | Admitting: Cardiology
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
## 2012-08-27 ENCOUNTER — Other Ambulatory Visit: Payer: Self-pay | Admitting: Cardiology

## 2012-08-28 ENCOUNTER — Encounter (HOSPITAL_BASED_OUTPATIENT_CLINIC_OR_DEPARTMENT_OTHER)
Admission: RE | Disposition: A | Discharge status: Home / Self Care | Payer: Self-pay | Source: Ambulatory Visit | Attending: Cardiology

## 2012-08-28 ENCOUNTER — Inpatient Hospital Stay (HOSPITAL_BASED_OUTPATIENT_CLINIC_OR_DEPARTMENT_OTHER)
Admission: RE | Admit: 2012-08-28 | Discharge: 2012-08-28 | Disposition: A | Discharge status: Home / Self Care | Payer: Managed Care, Other (non HMO) | Source: Ambulatory Visit | Attending: Cardiology | Admitting: Cardiology

## 2012-08-28 DIAGNOSIS — R9439 Abnormal result of other cardiovascular function study: Secondary | ICD-10-CM | POA: Insufficient documentation

## 2012-08-28 DIAGNOSIS — R079 Chest pain, unspecified: Secondary | ICD-10-CM | POA: Diagnosis not present

## 2012-08-28 DIAGNOSIS — R9430 Abnormal result of cardiovascular function study, unspecified: Secondary | ICD-10-CM

## 2012-08-28 SURGERY — JV LEFT HEART CATHETERIZATION WITH CORONARY ANGIOGRAM

## 2012-08-28 MED ORDER — ACETAMINOPHEN 325 MG PO TABS: 650.0000 mg | ORAL_TABLET | ORAL | Status: DC | PRN

## 2012-08-28 MED ORDER — SODIUM CHLORIDE 0.9 % IV SOLN: 1.0000 mL/kg/h | INTRAVENOUS | Status: DC

## 2012-08-28 MED ORDER — ONDANSETRON HCL 4 MG/2ML IJ SOLN: 4.0000 mg | Freq: Four times a day (QID) | INTRAMUSCULAR | Status: DC | PRN

## 2012-08-28 NOTE — Interval H&P Note (Signed)
History and Physical Interval Note:  08/28/2012 9:34 AM  Jasmine Contreras  has presented today for surgery, with the diagnosis of cp  The various methods of treatment have been discussed with the patient and family. After consideration of risks, benefits and other options for treatment, the patient has consented to  Procedure(s): JV LEFT HEART CATHETERIZATION WITH CORONARY ANGIOGRAM (N/A) as a surgical intervention .  The patient's history has been reviewed, patient examined, no change in status, stable for surgery.  I have reviewed the patient's chart and labs.  Questions were answered to the patient's satisfaction.     Sarinity Dicicco R
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
## 2012-08-28 NOTE — CV Procedure (Signed)
PROCEDURE:  Left heart catheterization with selective coronary angiography, left ventriculogram.  INDICATIONS:  Chest pain and abnormal myocardial perfusion imaging  The risks, benefits, and details of the procedure were explained to the patient.  The patient verbalized understanding and wanted to proceed.  Informed written consent was obtained.  PROCEDURE TECHNIQUE:  After Xylocaine anesthesia a 29F sheath was placed in the right femoral artery with a single anterior needle wall stick.   Left coronary angiography was done using a Judkins L4 guide catheter.  Right coronary angiography was done using a Judkins R4 guide catheter.  Left ventriculography was done using a pigtail catheter.    CONTRAST:  Total of 70 cc.  COMPLICATIONS:  None.    HEMODYNAMICS:  Aortic pressure was 151/71mmHg; LV pressure was 152/101mmHg; LVEDP .  There was no gradient between the left ventricle and aorta.    ANGIOGRAPHIC DATA:   The left main coronary artery is widely patent and bifurcates into an LAD and left circumflex arteries.  The left anterior descending artery is widely patent and gives rise to a first moderate sized diagonal which is patent and a second diagonal which is large and patent.  The left circumflex artery is widely patent.  It gives rise to a very large OM 1 which is widely patent.  The right coronary artery is widely patent throughout its course and distally bifurcates into a PDA and PL branches which are patent.  LEFT VENTRICULOGRAM:  Left ventricular angiogram was done in the 30 RAO projection and revealed normal left ventricular wall motion and systolic function with an estimated ejection fraction of 55%.  LVEDP was 15 mmHg.  IMPRESSIONS:  1. Normal left main coronary artery. 2. Normal left anterior descending artery and its branches. 3. Normal left circumflex artery and its branches. 4. Normal right coronary artery. 5. Normal left ventricular systolic function.  LVEDP 15 mmHg.   Ejection fraction 55%.  RECOMMENDATION:   1.  D/C home after IVF and bedrest complete. 2.  Follow up with my NP in 2 weeks for groin check 3.  Continue home meds 4.  Follow up with primary MD for further workup of noncardiac chest pain.

## 2012-08-28 NOTE — OR Nursing (Signed)
Tegaderm dressing applied, site level 0, bedrest begins at 1020 

## 2012-08-28 NOTE — OR Nursing (Signed)
Meal served 

## 2012-08-28 NOTE — OR Nursing (Signed)
Discharge instructions reviewed and signed, pt stated understanding, ambulated in hall without difficulty, site level 0, transported to husband's car via wheelchiar

## 2012-09-04 ENCOUNTER — Other Ambulatory Visit: Payer: Self-pay | Admitting: Cardiology

## 2012-09-04 ENCOUNTER — Ambulatory Visit
Admission: RE | Admit: 2012-09-04 | Discharge: 2012-09-04 | Disposition: A | Discharge status: Home / Self Care | Payer: Managed Care, Other (non HMO) | Source: Ambulatory Visit | Attending: Cardiology | Admitting: Cardiology

## 2012-09-04 DIAGNOSIS — R1030 Lower abdominal pain, unspecified: Secondary | ICD-10-CM

## 2012-11-28 ENCOUNTER — Other Ambulatory Visit: Payer: Self-pay | Admitting: Ophthalmology

## 2012-12-12 ENCOUNTER — Other Ambulatory Visit: Payer: Self-pay | Admitting: Ophthalmology

## 2013-10-04 IMAGING — US US EXTREM LOW DUPLEX ARTERIAL*R* LIMITED
1 series · 14 of 25 positions shown · non-contrast
Comparison: None.

CLINICAL DATA: Right groin cardiac cath 1 week ago.  Evaluate for
pseudoaneurysm.

RIGHT LOWER EXTREMITY ARTERIAL DUPLEX EVAL

[Series 1: us extrem low duplex arterial*right* limited · 0.10mm/px · 14 of 28 slices shown]
[im 1/28]
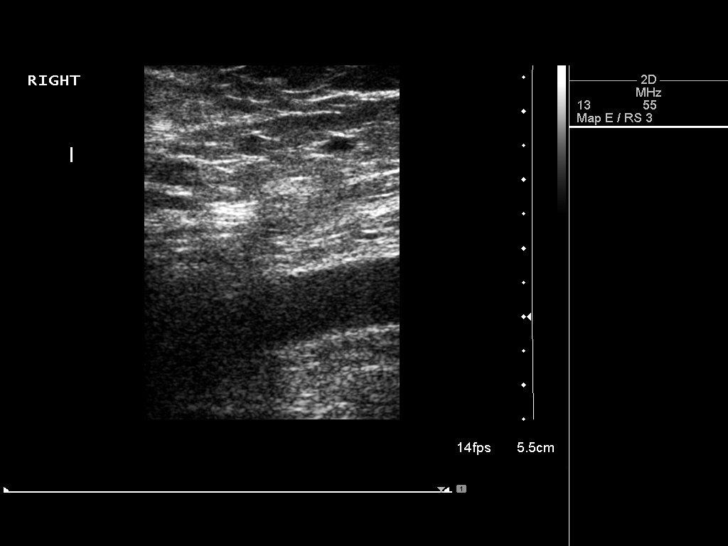
[im 3/28]
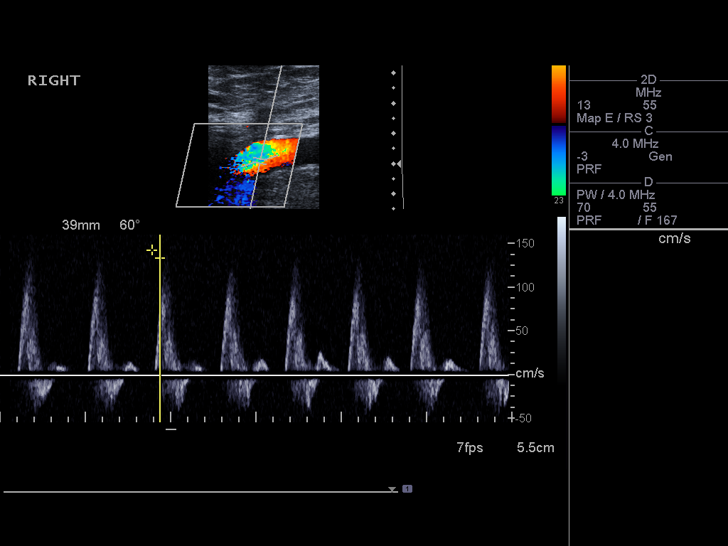
[im 5/28]
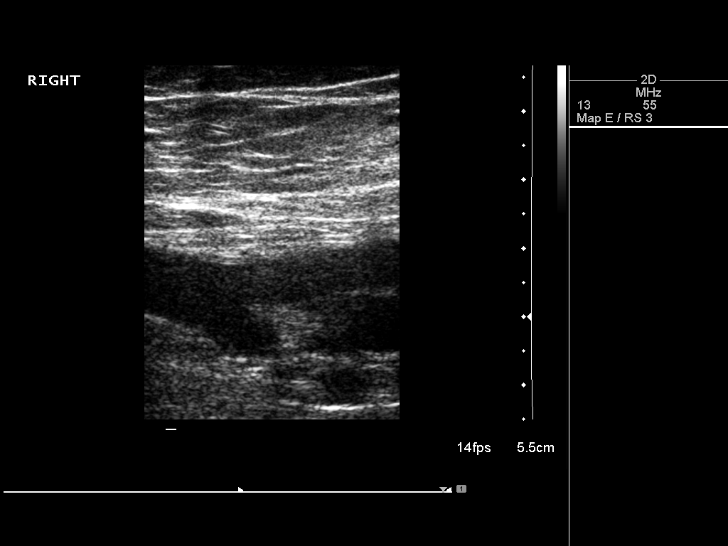
[im 7/28]
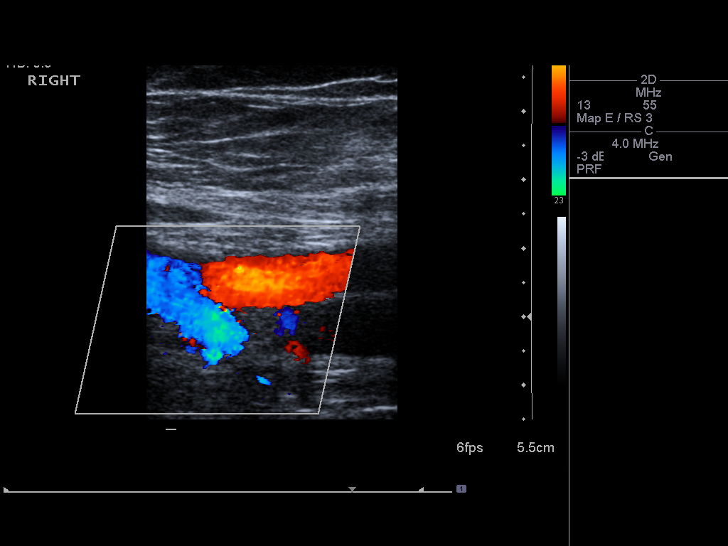
[im 10/28]
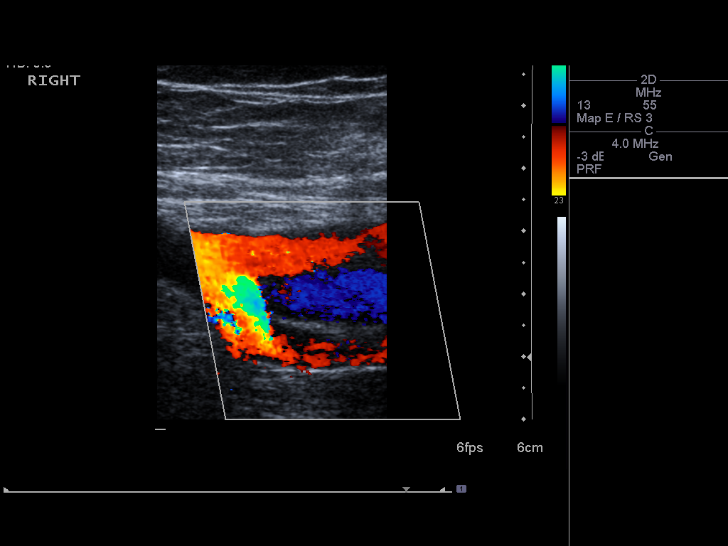
[im 11/28]
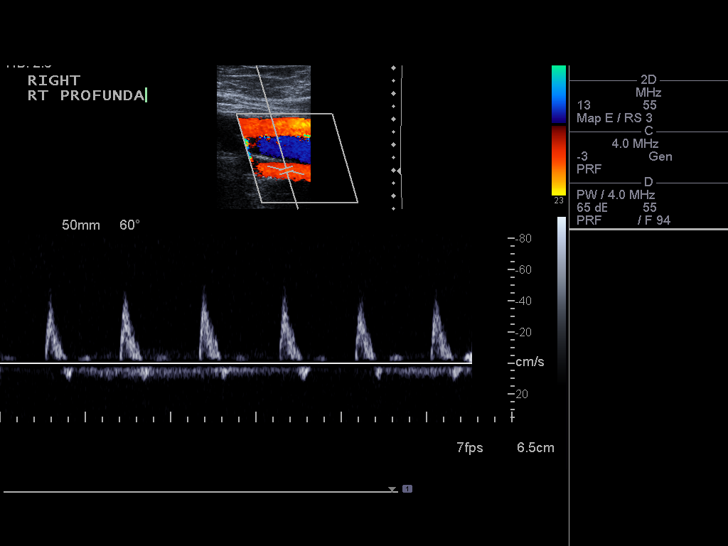
[im 13/28]
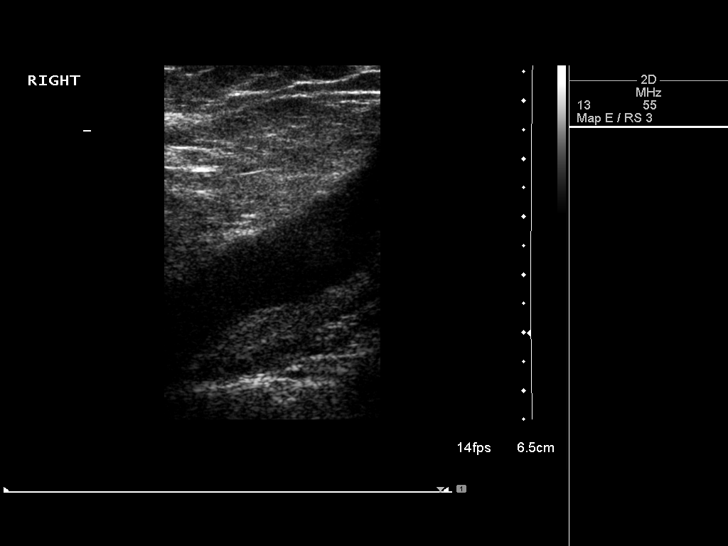
[im 15/28]
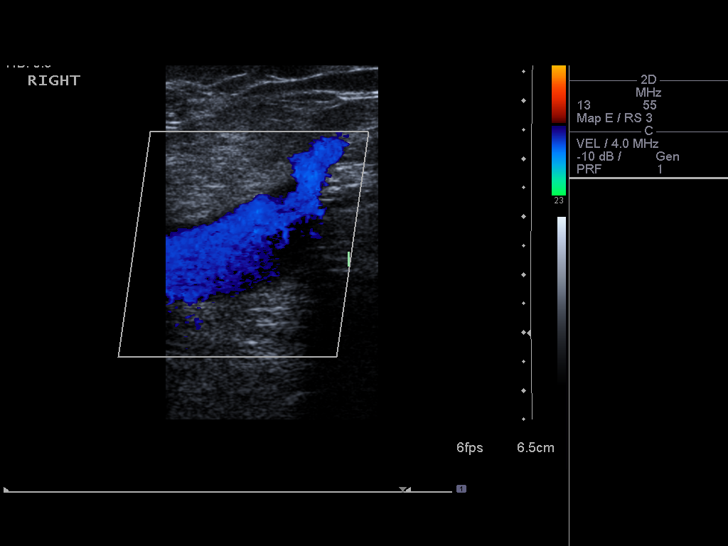
[im 17/28]
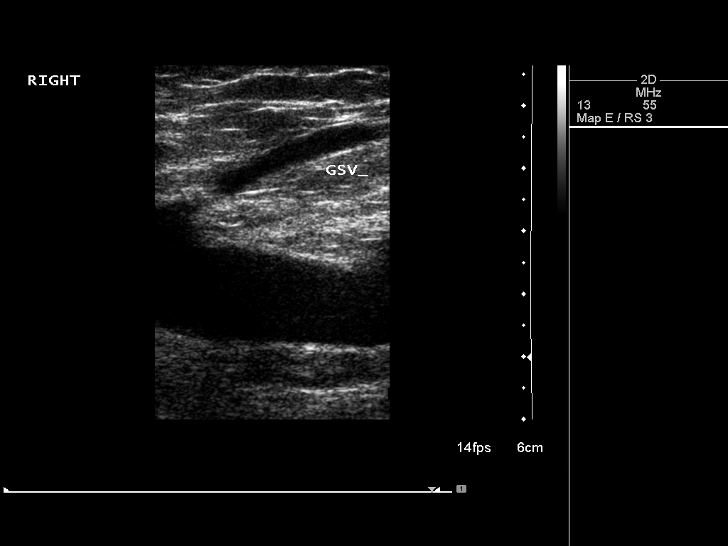
[im 19/28]
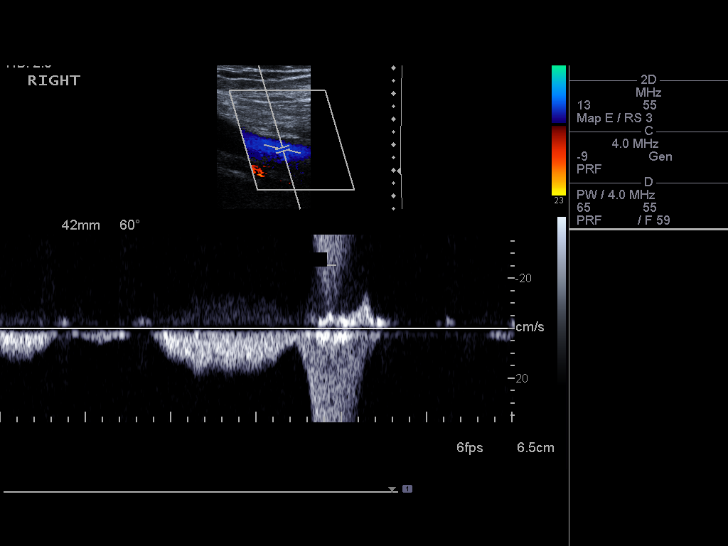
[im 21/28]
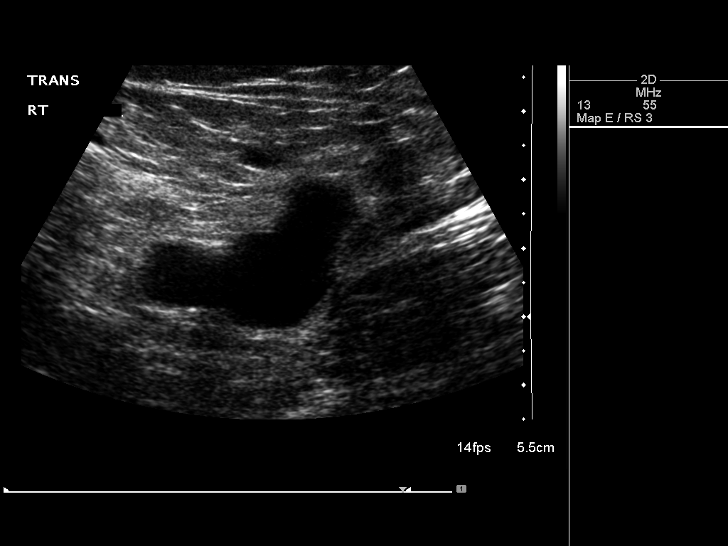
[im 23/28]
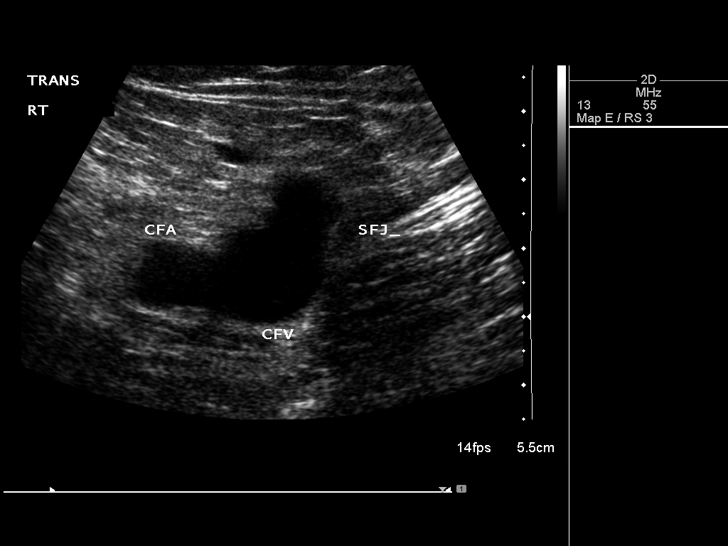
[im 25/28]
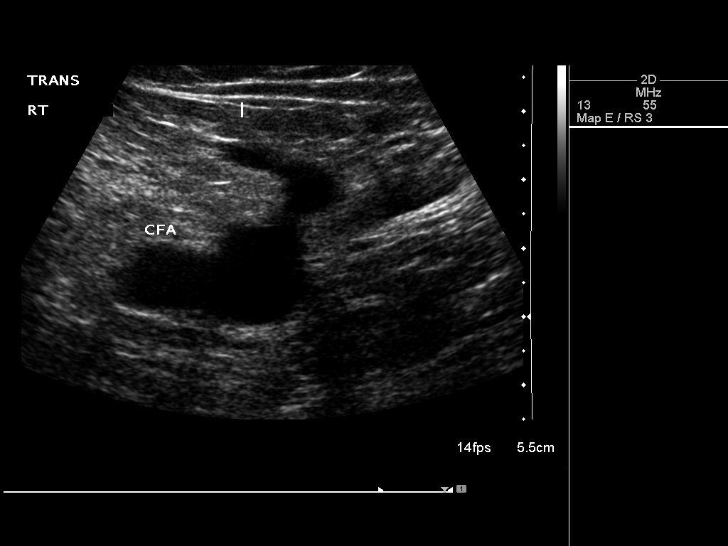
[im 28/28]
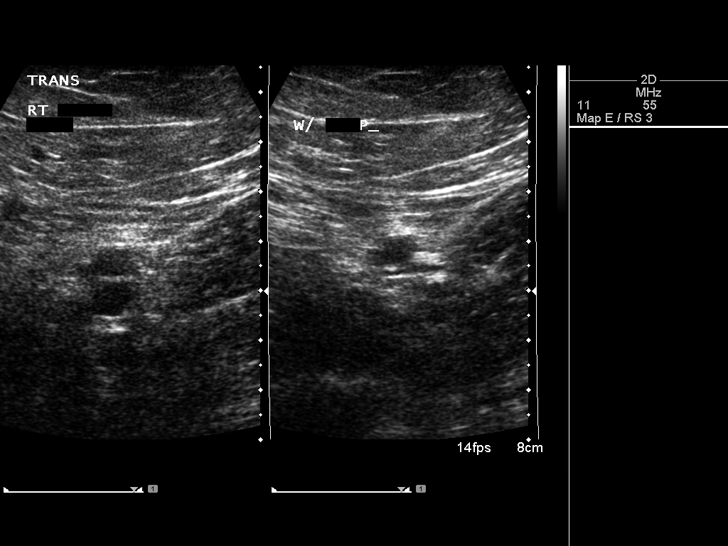

[14 of 25 positions shown; findings below may reference images not displayed]

FINDINGS: There is no evidence of pseudoaneurysm or hematoma within
the right groin.  Arterial and Doppler imaging of the right common
femoral artery demonstrates patency and a triphasic wave form.  The
right common femoral vein is patent by color flow imaging.  Right
profunda and superficial femoral arteries in the proximal thigh are
also patent with triphasic wave forms.
IMPRESSION: No evidence of right groin hematoma or pseudoaneurysm.

## 2014-03-09 ENCOUNTER — Encounter (HOSPITAL_COMMUNITY): Payer: Self-pay | Admitting: Emergency Medicine

## 2014-03-09 ENCOUNTER — Emergency Department (INDEPENDENT_AMBULATORY_CARE_PROVIDER_SITE_OTHER)
Admission: EM | Admit: 2014-03-09 | Discharge: 2014-03-09 | Disposition: A | Discharge status: Home / Self Care | Payer: Managed Care, Other (non HMO) | Source: Home / Self Care | Attending: Emergency Medicine | Admitting: Emergency Medicine

## 2014-03-09 DIAGNOSIS — J40 Bronchitis, not specified as acute or chronic: Secondary | ICD-10-CM

## 2014-03-09 MED ORDER — ALBUTEROL SULFATE HFA 108 (90 BASE) MCG/ACT IN AERS: 1.0000 | INHALATION_SPRAY | Freq: Four times a day (QID) | RESPIRATORY_TRACT | Status: DC | PRN

## 2014-03-09 MED ORDER — ALBUTEROL SULFATE (2.5 MG/3ML) 0.083% IN NEBU
INHALATION_SOLUTION | RESPIRATORY_TRACT | Status: AC
  Filled 2014-03-09: qty 6

## 2014-03-09 MED ORDER — ALBUTEROL SULFATE (2.5 MG/3ML) 0.083% IN NEBU
5.0000 mg | INHALATION_SOLUTION | Freq: Once | RESPIRATORY_TRACT | Status: AC
  Administered 2014-03-09: 5 mg via RESPIRATORY_TRACT

## 2014-03-09 MED ORDER — BENZONATATE 100 MG PO CAPS: 100.0000 mg | ORAL_CAPSULE | Freq: Three times a day (TID) | ORAL | Status: DC | PRN

## 2014-03-09 NOTE — ED Notes (Signed)
Pt  Reports  Symptoms  Of a  Cough  That  At times  Is  Productive  And  At  Others  Is  Not    She  Reports  The  Symptoms  X  3  Days      She  Reports  A  Burning             Sensation in her  Chest     From the  Coughing

## 2014-03-09 NOTE — ED Provider Notes (Signed)
CSN: [id number]     Arrival date & time 03/09/14  7829 History   First MD Initiated Contact with Patient 03/09/14 [phone]     Chief Complaint  Patient presents with  . Cough   (Consider location/radiation/quality/duration/timing/severity/associated sxs/prior Treatment) HPI Comments: PCP: Dr. Esmond Harps  Patient is a 63 y.o. female presenting with cough. The history is provided by the patient.  Cough Cough characteristics:  Non-productive and barking Severity:  Moderate Onset quality:  Gradual Duration:  3 days Timing:  Constant Progression:  Worsening Chronicity:  New Smoker: no   Associated symptoms: no chest pain, no chills, no diaphoresis, no ear fullness, no ear pain, no eye discharge, no fever, no headaches, no myalgias, no rash, no rhinorrhea, no shortness of breath, no sinus congestion, no sore throat, no weight loss and no wheezing     Past Medical History  Diagnosis Date  . Herpes   . Depression   . Anxiety   . Retaining fluid   . Heart murmur   . Irritable bowel syndrome   . Hiatal hernia   . PUD (peptic ulcer disease)     s/p vagotomy  . Varicose veins   . GERD (gastroesophageal reflux disease)   . MVP (mitral valve prolapse)   . Arthritis     trochanteric bursitis  . Vertigo   . Vitamin D deficiency    Past Surgical History  Procedure Laterality Date  . Cholecystectomy    . Vaginal hysterectomy      with oophorectomy  . Trochanteric bursectomy    . Partial gastrectomy with vagotomy    . Right arthroscopic knee surgery    . Breast lumpectomy     Family History  Problem Relation Age of Onset  . Alcohol abuse Father    History  Substance Use Topics  . Smoking status: Never Smoker   . Smokeless tobacco: Not on file  . Alcohol Use: No   OB History   Grav Para Term Preterm Abortions TAB SAB Ect Mult Living                 Review of Systems  Constitutional: Negative for fever, chills, weight loss and diaphoresis.  HENT: Negative for congestion, ear  pain, postnasal drip, rhinorrhea, sore throat and trouble swallowing.   Eyes: Negative for discharge.  Respiratory: Positive for cough and chest tightness. Negative for shortness of breath, wheezing and stridor.   Cardiovascular: Negative for chest pain, palpitations and leg swelling.  Gastrointestinal: Negative.   Endocrine: Negative for polydipsia, polyphagia and polyuria.  Genitourinary: Negative.   Musculoskeletal: Negative for arthralgias and myalgias.  Skin: Negative.  Negative for rash.  Allergic/Immunologic: Negative for immunocompromised state.  Neurological: Negative for dizziness, syncope and headaches.  Hematological: Negative for adenopathy.    Allergies  Peanuts; Abilify; Antivert; Codeine; Hydrocodone; Oxycodone hcl; Prednisone; Scopace; Shellfish allergy; Tavist-d; and Ultram  Home Medications   Prior to Admission medications   Medication Sig Start Date End Date Taking? Authorizing Provider  albuterol (PROVENTIL HFA;VENTOLIN HFA) 108 (90 BASE) MCG/ACT inhaler Inhale 1-2 puffs into the lungs every 6 (six) hours as needed for wheezing or shortness of breath. Or persistent coughing 03/09/14   Mathis Fare Gayle Martinez, PA  benzonatate (TESSALON) 100 MG capsule Take 1 capsule (100 mg total) by mouth 3 (three) times daily as needed for cough. 03/09/14   Mathis Fare Jaidalyn Schillo, PA  BIOTIN FORTE PO Take 1 capsule by mouth every other day.     Historical Provider, MD  colchicine 0.6 MG tablet Take 0.6 mg by mouth as needed. For gout flare ups    Historical Provider, MD  esomeprazole (NEXIUM) 20 MG capsule Take 40 mg by mouth daily as needed.     Historical Provider, MD  folic acid (FOLVITE) 400 MCG tablet Take 400 mcg by mouth daily.    Historical Provider, MD  furosemide (LASIX) 20 MG tablet Take 20 mg by mouth 2 (two) times daily. 2 tablets twice daily    Historical Provider, MD  Garcinia Cambogia-Chromium 500-200 MG-MCG TABS Take 1 tablet by mouth 2 (two) times daily.    Historical  Provider, MD  hydroxypropyl methylcellulose (ISOPTO TEARS) 2.5 % ophthalmic solution Place 2 drops into both eyes 3 (three) times daily as needed. For dry eyes     Historical Provider, MD  hyoscyamine (LEVSIN SL) 0.125 MG SL tablet Place 0.125 mg under the tongue every 4 (four) hours as needed. Stomach spasms    Historical Provider, MD  Misc Natural Products (HORNY GOAT WEED PO) Take 1 tablet by mouth daily.    Historical Provider, MD  Misc Natural Products (PROGESTERONE EX) Apply 1 application topically as directed. Small amount to both thighs twice daily to prevent hot flashes    Historical Provider, MD  Multiple Vitamins-Minerals (ALIVE WOMENS 50+ PO) Take 2 tablets by mouth daily.    Historical Provider, MD  potassium chloride SA (K-DUR,KLOR-CON) 20 MEQ tablet Take 20 mEq by mouth daily. 2 tablets daily    Historical Provider, MD  vitamin B-12 (CYANOCOBALAMIN) 1000 MCG tablet Take 1,000 mcg by mouth daily.      Historical Provider, MD   BP 152/69  Pulse 71  Temp(Src) 98 F (36.7 C) (Oral)  Resp 14  SpO2 100% Physical Exam  Nursing note and vitals reviewed. Constitutional: She is oriented to person, place, and time. She appears well-developed and well-nourished. No distress.  +overweight  HENT:  Head: Normocephalic and atraumatic.  Right Ear: External ear normal.  Left Ear: External ear normal.  Nose: Nose normal.  Mouth/Throat: Oropharynx is clear and moist. No oropharyngeal exudate.  Eyes: Conjunctivae are normal. No scleral icterus.  Neck: Normal range of motion. Neck supple.  Cardiovascular: Normal rate, regular rhythm and normal heart sounds.   Pulmonary/Chest: Effort normal. No stridor. No respiratory distress. She has wheezes. She has no rales. She exhibits no tenderness.  +persistent coughing  Musculoskeletal: Normal range of motion. She exhibits no edema.  No pedal edema  Lymphadenopathy:    She has no cervical adenopathy.  Neurological: She is alert and oriented to  person, place, and time.  Skin: Skin is warm and dry. No rash noted. No erythema.  Psychiatric: She has a normal mood and affect. Her behavior is normal.    ED Course  Procedures (including critical care time) Labs Review Labs Reviewed - No data to display  Imaging Review No results found.   MDM   1. Bronchitis    Hx and exam suggest bronchitis with associated mild bronchospasm likely a result of viral URI. Patient reports allergy to prednisone and peanuts, so only able to provide albuterol nebulized treatment. Coughing improved following albuterol treatment. Will treat at home with albuterol MDI and tessalon perles and advise follow up with PCP if symptoms do not improve.    Ria Clock, [place] 03/09/14 1050

## 2014-03-09 NOTE — ED Provider Notes (Signed)
Medical screening examination/treatment/procedure(s) were performed by non-physician practitioner and as supervising physician I was immediately available for consultation/collaboration.  Leslee Home, M.D.  Reuben Likes, MD 03/09/14 [phone]

## 2014-04-10 ENCOUNTER — Other Ambulatory Visit (HOSPITAL_COMMUNITY): Payer: Self-pay | Admitting: Family Medicine

## 2014-04-10 DIAGNOSIS — Z1231 Encounter for screening mammogram for malignant neoplasm of breast: Secondary | ICD-10-CM

## 2014-04-11 ENCOUNTER — Ambulatory Visit (HOSPITAL_COMMUNITY): Payer: Managed Care, Other (non HMO)

## 2014-04-18 ENCOUNTER — Ambulatory Visit (HOSPITAL_COMMUNITY): Payer: Managed Care, Other (non HMO)

## 2014-06-07 ENCOUNTER — Encounter (HOSPITAL_COMMUNITY): Payer: Self-pay | Admitting: *Deleted

## 2014-06-07 ENCOUNTER — Emergency Department (HOSPITAL_COMMUNITY)
Admission: EM | Admit: 2014-06-07 | Discharge: 2014-06-07 | Disposition: A | Discharge status: Home / Self Care | Payer: Managed Care, Other (non HMO) | Source: Home / Self Care | Attending: Emergency Medicine | Admitting: Emergency Medicine

## 2014-06-07 DIAGNOSIS — R42 Dizziness and giddiness: Secondary | ICD-10-CM

## 2014-06-07 MED ORDER — ONDANSETRON HCL 4 MG PO TABS: 4.0000 mg | ORAL_TABLET | Freq: Four times a day (QID) | ORAL | Status: DC

## 2014-06-07 MED ORDER — SCOPOLAMINE 1 MG/3DAYS TD PT72: 1.0000 | MEDICATED_PATCH | TRANSDERMAL | Status: DC

## 2014-06-07 MED ORDER — PREDNISONE 20 MG PO TABS: 40.0000 mg | ORAL_TABLET | Freq: Every day | ORAL | Status: DC

## 2014-06-07 MED ORDER — ONDANSETRON 4 MG PO TBDP
4.0000 mg | ORAL_TABLET | Freq: Once | ORAL | Status: AC
  Administered 2014-06-07: 4 mg via ORAL

## 2014-06-07 NOTE — ED Notes (Signed)
C/o vertigo for 3 weeks.  Hx. of same. Had gotten down to 1/4 of Scopalamine patch but got worse @ 0300. So she got up and put a whole patch on.  Felt like the whole room was moving. C/o nausea.

## 2014-06-07 NOTE — Discharge Instructions (Signed)
Your vertigo is likely coming from a sand particle in your inner ear. Try doing the Epley maneuver a few more times today and tomorrow. Continue the scopolamine patch.  Do not place more than 1 at a time. Use zofran every 6 hours as needed for nausea. If the vertigo does not improve with the Epley maneuver, fill the prescription for prednisone and take 2 tablets (40mg ) daily for 5 days.  Follow up with your regular doctor in the next 1-2 weeks. If you get so dizzy that you cannot walk, please go to the emergency room.

## 2014-06-07 NOTE — ED Provider Notes (Signed)
CSN: [id number]     Arrival date & time 06/07/14  0910 History   First MD Initiated Contact with Patient 06/07/14 [phone]     Chief Complaint  Patient presents with  . Dizziness   (Consider location/radiation/quality/duration/timing/severity/associated sxs/prior Treatment) HPI  She is a 63 year old woman here for evaluation of vertigo. She states this is a recurrent problem for her. Her current episode started a few weeks ago. She has been gradually tapering off a scopolamine patch. Last night, she was down to a quarter patch, but then had recurrence of her vertigo at 3 AM this morning. It worsens with any sort of head movement. It is associated with nausea. She does report an occasional whistling sound in her left ear.  Past Medical History  Diagnosis Date  . Herpes   . Depression   . Anxiety   . Retaining fluid   . Heart murmur   . Irritable bowel syndrome   . Hiatal hernia   . PUD (peptic ulcer disease)     s/p vagotomy  . Varicose veins   . GERD (gastroesophageal reflux disease)   . MVP (mitral valve prolapse)   . Arthritis     trochanteric bursitis  . Vertigo   . Vitamin D deficiency    Past Surgical History  Procedure Laterality Date  . Cholecystectomy    . Vaginal hysterectomy      with oophorectomy  . Trochanteric bursectomy    . Partial gastrectomy with vagotomy    . Right arthroscopic knee surgery    . Breast lumpectomy     Family History  Problem Relation Age of Onset  . Alcohol abuse Father    History  Substance Use Topics  . Smoking status: Never Smoker   . Smokeless tobacco: Not on file  . Alcohol Use: No   OB History    No data available     Review of Systems As in history of present illness Allergies  Peanuts; Abilify; Antivert; Codeine; Hydrocodone; Oxycodone hcl; Prednisone; Scopace; Shellfish allergy; Tavist-d; and Ultram  Home Medications   Prior to Admission medications   Medication Sig Start Date End Date Taking? Authorizing Provider   BIOTIN FORTE PO Take 1 capsule by mouth every other day.    Yes Historical Provider, MD  Cholecalciferol (VITAMIN D-3 PO) Take 1,000 Int'l Units by mouth daily.   Yes Historical Provider, MD  colchicine 0.6 MG tablet Take 0.6 mg by mouth as needed. For gout flare ups   Yes Historical Provider, MD  folic acid (FOLVITE) 400 MCG tablet Take 400 mcg by mouth daily.   Yes Historical Provider, MD  furosemide (LASIX) 20 MG tablet Take 20 mg by mouth 2 (two) times daily. 2 tablets twice daily   Yes Historical Provider, MD  hydroxypropyl methylcellulose (ISOPTO TEARS) 2.5 % ophthalmic solution Place 2 drops into both eyes 3 (three) times daily as needed. For dry eyes    Yes Historical Provider, MD  Misc Natural Products (PROGESTERONE EX) Apply 1 application topically as directed. Small amount to both thighs twice daily to prevent hot flashes   Yes Historical Provider, MD  potassium chloride SA (K-DUR,KLOR-CON) 20 MEQ tablet Take 20 mEq by mouth daily. 2 tablets daily   Yes Historical Provider, MD  vitamin B-12 (CYANOCOBALAMIN) 1000 MCG tablet Take 1,000 mcg by mouth daily.     Yes Historical Provider, MD  albuterol (PROVENTIL HFA;VENTOLIN HFA) 108 (90 BASE) MCG/ACT inhaler Inhale 1-2 puffs into the lungs every 6 (six) hours as needed  for wheezing or shortness of breath. Or persistent coughing 03/09/14   Mathis FareJennifer Lee H Presson, PA  benzonatate (TESSALON) 100 MG capsule Take 1 capsule (100 mg total) by mouth 3 (three) times daily as needed for cough. 03/09/14   Mathis FareJennifer Lee H Presson, PA  esomeprazole (NEXIUM) 20 MG capsule Take 40 mg by mouth daily as needed.     Historical Provider, MD  Garcinia Cambogia-Chromium 500-200 MG-MCG TABS Take 1 tablet by mouth 2 (two) times daily.    Historical Provider, MD  hyoscyamine (LEVSIN SL) 0.125 MG SL tablet Place 0.125 mg under the tongue every 4 (four) hours as needed. Stomach spasms    Historical Provider, MD  Misc Natural Products (HORNY GOAT WEED PO) Take 1 tablet by  mouth daily.    Historical Provider, MD  Multiple Vitamins-Minerals (ALIVE WOMENS 50+ PO) Take 2 tablets by mouth daily.    Historical Provider, MD  ondansetron (ZOFRAN) 4 MG tablet Take 1 tablet (4 mg total) by mouth every 6 (six) hours. 06/07/14   Charm RingsErin J Honig, MD  predniSONE (DELTASONE) 20 MG tablet Take 2 tablets (40 mg total) by mouth daily. 06/07/14   Charm RingsErin J Honig, MD   BP 127/82 mmHg  Pulse 64  Temp(Src) 98 F (36.7 C) (Oral)  Resp 16  SpO2 98% Physical Exam  Constitutional: She is oriented to person, place, and time. She appears well-developed and well-nourished. She appears distressed.  Cardiovascular: Normal rate.   Pulmonary/Chest: Effort normal.  Neurological: She is alert and oriented to person, place, and time.   Epley maneuver performed with improvement of her symptoms  ED Course  Procedures (including critical care time) Labs Review Labs Reviewed - No data to display  Imaging Review No results found.   MDM   1. Vertigo    This is likely BPPV given the improvement with Epley maneuver and history of worsening with head movements. With report of a whistling sound, Mnire's is possible. Prescription provided for Zofran to use as needed for nausea. Continue with the scopolamine patch. Handout given on Epley maneuver. Recommended doing it a few more times over today and tomorrow. If her symptoms have not resolved by Monday, she should fill the prescription for prednisone 40 mg daily for 5 days. Follow-up with primary care provider in 1-2 weeks. Go to the emergency room if dizziness is so extreme that she cannot walk.    Charm RingsErin J Honig, MD 06/07/14 1017

## 2015-02-03 ENCOUNTER — Other Ambulatory Visit (HOSPITAL_COMMUNITY): Payer: Self-pay | Admitting: Family Medicine

## 2015-02-03 DIAGNOSIS — Z1231 Encounter for screening mammogram for malignant neoplasm of breast: Secondary | ICD-10-CM

## 2015-02-04 ENCOUNTER — Ambulatory Visit (HOSPITAL_COMMUNITY)
Admission: RE | Admit: 2015-02-04 | Discharge: 2015-02-04 | Disposition: A | Discharge status: Home / Self Care | Payer: Managed Care, Other (non HMO) | Source: Ambulatory Visit | Attending: Family Medicine | Admitting: Family Medicine

## 2015-02-04 DIAGNOSIS — Z1231 Encounter for screening mammogram for malignant neoplasm of breast: Secondary | ICD-10-CM | POA: Diagnosis not present

## 2015-05-01 ENCOUNTER — Ambulatory Visit (INDEPENDENT_AMBULATORY_CARE_PROVIDER_SITE_OTHER): Payer: Managed Care, Other (non HMO) | Admitting: Podiatry

## 2015-05-01 ENCOUNTER — Encounter: Payer: Self-pay | Admitting: Podiatry

## 2015-05-01 ENCOUNTER — Ambulatory Visit (INDEPENDENT_AMBULATORY_CARE_PROVIDER_SITE_OTHER): Payer: Managed Care, Other (non HMO)

## 2015-05-01 VITALS — BP 112/63 | HR 67 | Resp 16 | Ht 69.0 in | Wt 270.0 lb

## 2015-05-01 DIAGNOSIS — M79672 Pain in left foot: Secondary | ICD-10-CM

## 2015-05-01 DIAGNOSIS — M79671 Pain in right foot: Secondary | ICD-10-CM

## 2015-05-01 DIAGNOSIS — M722 Plantar fascial fibromatosis: Secondary | ICD-10-CM

## 2015-05-01 NOTE — Patient Instructions (Signed)

## 2015-05-01 NOTE — Progress Notes (Signed)
   Subjective:    Patient ID: Jasmine Contreras, female    DOB: 12/08/1950, 64 y.o.   MRN: [id number]  HPI Patient presents with bilateral foot pain, heel. Pt stated, "more pain in left foot"; x1 year   Review of Systems  HENT: Positive for sinus pressure and tinnitus.   Cardiovascular: Positive for palpitations.  Musculoskeletal: Positive for arthralgias and gait problem.  Allergic/Immunologic: Positive for food allergies.  Hematological: Bruises/bleeds easily.  All other systems reviewed and are negative.      Objective:   Physical Exam        Assessment & Plan:

## 2015-05-03 NOTE — Progress Notes (Signed)
Subjective:     Patient ID: Jasmine Contreras, female   DOB: 06/28/1951, 64 y.o.   MRN: [id number]  HPI patient presents stating I'm having a lot of pain in my left heel and then also in my right. States she cannot have injections for this and that the pain has gradually intensified over this time and making it more difficult to walk   Review of Systems  All other systems reviewed and are negative.      Objective:   Physical Exam  Constitutional: She is oriented to person, place, and time.  Cardiovascular: Intact distal pulses.   Musculoskeletal: Normal range of motion.  Neurological: She is oriented to person, place, and time.  Skin: Skin is warm.  Nursing note and vitals reviewed.  neurovascular status intact muscle strength adequate range of motion within normal limits with patient noted to have discomfort of a severe nature in the plantar aspect left heel at the insertional point tendon the calcaneus and moderate pain in the right also noted. Patient states that she cannot do injections can't take anti-inflammatories but needs something done to try to cure this and also cannot have any kind of open surgery currently     Assessment:     Lanter fasciitis left over right with inflammation and fluid    Plan:     H&P conditions reviewed with patient and at this point I have recommended shockwave therapy which was administered to the left heel she had 3000 shocks at 17 frequency 4.0 intensity

## 2015-05-18 ENCOUNTER — Other Ambulatory Visit: Payer: Managed Care, Other (non HMO)

## 2015-06-01 ENCOUNTER — Other Ambulatory Visit: Payer: Managed Care, Other (non HMO)

## 2015-10-01 ENCOUNTER — Ambulatory Visit (INDEPENDENT_AMBULATORY_CARE_PROVIDER_SITE_OTHER): Payer: BLUE CROSS/BLUE SHIELD | Admitting: Neurology

## 2015-10-01 ENCOUNTER — Encounter: Payer: Self-pay | Admitting: Neurology

## 2015-10-01 VITALS — BP 108/60 | HR 72 | Resp 16 | Ht 69.0 in | Wt 267.0 lb

## 2015-10-01 DIAGNOSIS — R351 Nocturia: Secondary | ICD-10-CM

## 2015-10-01 DIAGNOSIS — Z658 Other specified problems related to psychosocial circumstances: Secondary | ICD-10-CM

## 2015-10-01 DIAGNOSIS — M542 Cervicalgia: Secondary | ICD-10-CM | POA: Diagnosis not present

## 2015-10-01 DIAGNOSIS — G4733 Obstructive sleep apnea (adult) (pediatric): Secondary | ICD-10-CM | POA: Diagnosis not present

## 2015-10-01 DIAGNOSIS — R202 Paresthesia of skin: Secondary | ICD-10-CM

## 2015-10-01 DIAGNOSIS — G2581 Restless legs syndrome: Secondary | ICD-10-CM | POA: Diagnosis not present

## 2015-10-01 DIAGNOSIS — R42 Dizziness and giddiness: Secondary | ICD-10-CM

## 2015-10-01 DIAGNOSIS — G4761 Periodic limb movement disorder: Secondary | ICD-10-CM | POA: Diagnosis not present

## 2015-10-01 DIAGNOSIS — F439 Reaction to severe stress, unspecified: Secondary | ICD-10-CM

## 2015-10-01 NOTE — Patient Instructions (Signed)
We will do an EMG and nerve conduction velocity test, which is an electrical nerve and muscle test, which we will schedule. We will call you with the results.  We will check blood work today and call you with the test results.  We will do a neck MRI with and without contrast.   Based on your symptoms and your exam I believe you are at risk for obstructive sleep apnea or OSA, and I think we should proceed with a sleep study to determine whether you do or do not have OSA and how severe it is. If you have more than mild OSA, I want you to consider treatment with CPAP. Please remember, the risks and ramifications of moderate to severe obstructive sleep apnea or OSA are: Cardiovascular disease, including congestive heart failure, stroke, difficult to control hypertension, arrhythmias, and even type 2 diabetes has been linked to untreated OSA. Sleep apnea causes disruption of sleep and sleep deprivation in most cases, which, in turn, can cause recurrent headaches, problems with memory, mood, concentration, focus, and vigilance. Most people with untreated sleep apnea report excessive daytime sleepiness, which can affect their ability to drive. Please do not drive if you feel sleepy.   I will likely see you back after your sleep study to go over the test results and where to go from there. We will call you after your sleep study to advise about the results (most likely, you will hear from Lafonda Mossesiana, my nurse) and to set up an appointment at the time, as necessary.    Our sleep lab administrative assistant, Alvis LemmingsDawn will meet with you or call you to schedule your sleep study. If you don't hear back from her by next week please feel free to call her at [phone]. This is her direct line and please leave a message with your phone number to call back if you get the voicemail box. She will call back as soon as possible.   You also have symptoms of Restless legs and likely related leg twitching in sleep.

## 2015-10-01 NOTE — Progress Notes (Signed)
Subjective:    Patient ID: Jasmine Contreras is a 65 y.o. female.  HPI     Huston Foley, MD, PhD [facility] [address] [address] [place], [place] [address]  Dear Dr. Cliffton Asters,   I saw your patient, Jasmine Contreras, upon your kind request in my neurologic clinic today for initial consultation of her paresthesias. The patient is unaccompanied today. As you know, [name] is a 65 year old right-handed woman with an underlying medical history of anxiety, depression, irritable bowel syndrome, PUD, hiatal hernia, reflux disease, mitral valve prolapse, palpitations, migraine headaches, arthritis, vertigo, vitamin D deficiency and morbid obesity, who reports problems with intermittent right-sided numbness for the past 4 weeks approximately. Symptoms do not affect her face. She denies any weakness or slurring of speech. Numbness is also associated with a stinging feeling at times, happens particularly when she lays down. Denies Lhermitte's phenomenon. Sometimes she wakes up with a sense of numbness on the right side and shifting position does not help. Symptoms may last for 30 minutes, she has had intermittent burning sensation around the right shoulder area. It seems to radiate from the neck area. She reports increase in heart palpitations. She has had more headaches recently. She has had increase in stress. She has been taking care of her elderly father (parents divorced when she was a child). They transitioned her father to assited Living last week.  She has had sleep issues in the past weeks to months. She has RLS symptoms on the R. She feels itchy at night, scratches all over, and no rash, no recent change in lotion, no change in detergent, no dietary changes, no recent new supplements, or meds.  Of note, she had a sleep study over 10 years ago and was told she had mild sleep apnea and some leg twitching was noted as I understand. A report is not available for my  review today. She has since then gained about 80 pounds. She does not sleep well. She feels almost anxious around nighttime. Her husband snores loudly. She snores some according to her husband. She has occasional morning headaches, nocturia about 1-3 times per night. She has intermittent neck pain, mainly on the left, starts at the base of the skull and  I reviewed your office note from 09/24/2015, which you kindly included. You ordered blood work including CMP and TSH. Recent blood work through your office from 12/04/2014 was reviewed: CBC with differential showed hemoglobin of 11.6 and hematocrit of 35.7, MCV of 79.7 and MCH of 25.9, otherwise unremarkable. CMP showed normal findings, TSH normal.  In the past, she saw my colleague, Dr. Denyse Amass for headaches and vertigo. I reviewed an office note from 06/22/2011. At the time, she reported vertigo associated with nausea and headache. She has a longer standing history of vertigo. She is a nonsmoker and drinks alcohol occasionally. She drinks caffeine in the form of coffee, 1-3 cups daily. She has had visual auras, mainly in the right VF, usually infrequent and typically not followed by a HA. Of note, she had a brain MRI w/wo contrast on 06/17/11:  IMPRESSION: No internal auditory canal mass or enhancing lesion.  Symmetric normal appearance of the labyrinthine structures.   Ectatic basilar artery causes compression of the undersurface of the hypothalamic region with the left posterior cerebral artery compressing the optic chiasm/optic tracts.   Scattered minimal nonspecific white matter type changes which may be related to rule out result of small vessel disease.  Her Past  Medical History Is Significant For: Past Medical History  Diagnosis Date  . Herpes   . Depression   . Anxiety   . Retaining fluid   . Heart murmur   . Irritable bowel syndrome   . Hiatal hernia   . PUD (peptic ulcer disease)     s/p vagotomy  . Varicose veins   .  GERD (gastroesophageal reflux disease)   . MVP (mitral valve prolapse)   . Arthritis     trochanteric bursitis  . Vertigo   . Vitamin D deficiency   . Headache     Her Past Surgical History Is Significant For: Past Surgical History  Procedure Laterality Date  . Cholecystectomy    . Vaginal hysterectomy      with oophorectomy  . Trochanteric bursectomy    . Partial gastrectomy with vagotomy    . Right arthroscopic knee surgery    . Breast lumpectomy      Her Family History Is Significant For: Family History  Problem Relation Age of Onset  . Alcohol abuse Father     Her Social History Is Significant For: Social History   Social History  . Marital Status: Married    Spouse Name: N/A  . Number of Children: 3  . Years of Education: College   Occupational History  . Retired     Social History Main Topics  . Smoking status: Never Smoker   . Smokeless tobacco: None  . Alcohol Use: 0.0 oz/week    0 Standard drinks or equivalent per week     Comment: 1 glass of wine with dinner  . Drug Use: No  . Sexual Activity: Not Currently    Birth Control/ Protection: Surgical   Other Topics Concern  . None   Social History Narrative   Patient drinks a pepsi or glass of tea in the morning.     Her Allergies Are:  Allergies  Allergen Reactions  . Peanuts [Peanut Oil] Hives  . Abilify [Aripiprazole]     somnelence  . Antivert [Meclizine Hcl] Nausea Only  . Codeine Itching and Nausea And Vomiting  . Hydrocodone Itching and Nausea And Vomiting  . Oxycodone Hcl Itching and Nausea And Vomiting  . Prednisone     Red face  . Scopace [Scopolamine] Nausea Only  . Shellfish Allergy Itching  . Tavist-D [Albertsons Dayhist-D]     unknown  . Ultram [Tramadol] Nausea Only  :   Her Current Medications Are:  Outpatient Encounter Prescriptions as of 10/01/2015  Medication Sig  . albuterol (PROVENTIL HFA;VENTOLIN HFA) 108 (90 BASE) MCG/ACT inhaler Inhale 1-2 puffs into the lungs  every 6 (six) hours as needed for wheezing or shortness of breath. Or persistent coughing  . benzonatate (TESSALON) 100 MG capsule Take 1 capsule (100 mg total) by mouth 3 (three) times daily as needed for cough.  Marland Kitchen BIOTIN FORTE PO Take 1 capsule by mouth every other day.   . Cholecalciferol (VITAMIN D-3 PO) Take 1,000 Int'l Units by mouth daily.  . colchicine 0.6 MG tablet Take 0.6 mg by mouth as needed. For gout flare ups  . esomeprazole (NEXIUM) 20 MG capsule Take 40 mg by mouth daily as needed.   . folic acid (FOLVITE) 400 MCG tablet Take 400 mcg by mouth daily.  . furosemide (LASIX) 40 MG tablet TK 2 T PO QAM AND TK 1 T PO AT NIGHT FOR 30 DAYS  . Garcinia Cambogia-Chromium 500-200 MG-MCG TABS Take 1 tablet by mouth 2 (two) times daily.  Marland Kitchen  hydroxypropyl methylcellulose (ISOPTO TEARS) 2.5 % ophthalmic solution Place 2 drops into both eyes 3 (three) times daily as needed. For dry eyes   . hyoscyamine (LEVSIN SL) 0.125 MG SL tablet Place 0.125 mg under the tongue every 4 (four) hours as needed. Stomach spasms  . metoprolol succinate (TOPROL-XL) 25 MG 24 hr tablet TK 1 T PO QD  . Misc Natural Products (HORNY GOAT WEED PO) Take 1 tablet by mouth daily.  . Misc Natural Products (PROGESTERONE EX) Apply 1 application topically as directed. Small amount to both thighs twice daily to prevent hot flashes  . Multiple Vitamins-Minerals (ALIVE WOMENS 50+ PO) Take 2 tablets by mouth daily.  . ondansetron (ZOFRAN) 4 MG tablet Take 1 tablet (4 mg total) by mouth every 6 (six) hours.  . potassium chloride SA (K-DUR,KLOR-CON) 20 MEQ tablet Take 20 mEq by mouth daily. 2 tablets daily  . scopolamine (TRANSDERM-SCOP) 1 MG/3DAYS Place 1 patch (1.5 mg total) onto the skin every 3 (three) days.  . vitamin B-12 (CYANOCOBALAMIN) 1000 MCG tablet Take 1,000 mcg by mouth daily.    . [DISCONTINUED] furosemide (LASIX) 20 MG tablet Take 20 mg by mouth 2 (two) times daily. 2 tablets twice daily   No facility-administered  encounter medications on file as of 10/01/2015.  :   Review of Systems:  Out of a complete 14 point review of systems, all are reviewed and negative with the exception of these symptoms as listed below:   Review of Systems  Neurological:       Patient reports R body numbness from toes shoulder. Usually last about 30 minutes.  Mostly happens when she first wakes up, has been going on for 6 weeks.  R should has a burning sensation that comes and goes.     Objective:  Neurologic Exam  Physical Exam Physical Examination:   Filed Vitals:   10/01/15 0920  BP: 108/60  Pulse: 72  Resp: 16    General Examination: The patient is a very pleasant 65 y.o. female in no acute distress. She appears well-developed and well-nourished and very well groomed.   HEENT: Normocephalic, atraumatic, pupils are equal, round and reactive to light and accommodation. Funduscopic exam is normal with sharp disc margins noted. Extraocular tracking is good without limitation to gaze excursion or nystagmus noted. Normal smooth pursuit is noted. Hearing is grossly intact. Tympanic membranes are clear bilaterally. Face is symmetric with normal facial animation and normal facial sensation. Speech is clear with no dysarthria noted. There is no hypophonia. There is no lip, neck/head, jaw or voice tremor. Neck is supple with full range of passive and active motion. There are no carotid bruits on auscultation. Oropharynx exam reveals: mild mouth dryness, adequate dental hygiene and mild airway crowding, due to redundant soft palate and elongated tongue. Tonsils are either very small or absent. Mallampati is class III. Tongue protrudes centrally and palate elevates symmetrically. Neck size is 14 inches. She has a Mild overbite.   Chest: Clear to auscultation without wheezing, rhonchi or crackles noted.  Heart: S1+S2+0, regular and normal without murmurs, rubs or gallops noted.   Abdomen: Soft, non-tender and non-distended  with normal bowel sounds appreciated on auscultation.  Extremities: There is trace pitting edema in the distal lower extremities bilaterally. Pedal pulses are intact.  Skin: Warm and dry without trophic changes noted. There are no varicose veins but has prominent spider veins throughout her distal legs.  Musculoskeletal: exam reveals no obvious joint deformities, tenderness or joint swelling or  erythema, with the exception of mild decrease in right knee range of motion, mild degree of right knee valgus.   Neurologically:  Mental status: The patient is awake, alert and oriented in all 4 spheres. Her immediate and remote memory, attention, language skills and fund of knowledge are appropriate. There is no evidence of aphasia, agnosia, apraxia or anomia. Speech is clear with normal prosody and enunciation. Thought process is linear. Mood is normal and affect is normal.  Cranial nerves II - XII are as described above under HEENT exam. In addition: shoulder shrug is normal with equal shoulder height noted. Motor exam: Normal bulk, strength and tone is noted. There is no drift, tremor or rebound. Romberg is negative. Reflexes are 1+ in the upper extremities, and trace in both lower extremities except for absence in the right knee, she does have a history of right knee surgery. Babinski: Toes are flexor bilaterally. Fine motor skills and coordination: intact with normal finger taps, normal hand movements, normal rapid alternating patting, normal foot taps and normal foot agility.  Cerebellar testing: No dysmetria or intention tremor on finger to nose testing. Heel to shin is unremarkable bilaterally. There is no truncal or gait ataxia.  Sensory exam: intact to light touch, pinprick, vibration, temperature sense in the upper and lower extremities.  Gait, station and balance: She stands with mild difficulty. No veering to one side is noted. No leaning to one side is noted. Posture is age-appropriate and stance  is narrow based. Gait shows normal stride length and normal pace. No problems turning are noted. Tandem walk is difficult for her, particularly because she has trouble placing weight on her right leg only.    Assessment and Plan:   In summary, Tennis Mustlizabeth M Soberano is a very pleasant 65 y.o.-year old female with an underlying medical history of iron deficiency, anxiety, depression, irritable bowel syndrome, PUD, hiatal hernia, reflux disease, mitral valve prolapse, palpitations, migraine headaches, arthritis, vertigo, vitamin D deficiency and morbid obesity, who reports problems with intermittent right-sided numbness for the past 4 weeks approximately. In addition, she reports restless leg symptoms, leg twitching on the right side, stress, neck pain on the left side, shoulder burning sensation radiating from the neck, sleep disturbance, snoring, prior diagnosis of OSA and PLMS, restless legs, visual auras, intermittent vertigo. Her symptoms may or may not be related to each other. Her exam is thankfully fairly nonfocal and she is reassured in that regard. Nevertheless, I think we need to look for cervical radiculopathy, try to tease out whether she has PLMS, look for treatable causes of possible peripheral neuropathy, and investigate her sleep disturbance as well. Stress can make a lot of symptoms worse I explained to the patient. I suggested we proceed with blood work today, schedule her for an EMG and nerve conduction test to the upper extremities, schedule a sleep study to confirm her prior diagnosis of sleep disordered breathing and look for PLMS. We will do a cervical spine MRI with and without contrast. We will call her with all her test results. I discussed this at length with the patient today. We will not start any new medications at this time but did talk about symptomatic treatment for restless leg syndrome down the Road. In addition, if she has obstructive sleep apnea, she will is advised to consider  treatment for this as well. I will see her back routinely in 3 months, sooner if needed. I answered all her questions today and the patient was in agreement.  Thank you very much for allowing me to participate in the care of this nice patient. If I can be of any further assistance to you please do not hesitate to call me at [phone].  Sincerely,   Star Age, MD, PhD

## 2015-10-02 ENCOUNTER — Institutional Professional Consult (permissible substitution): Payer: BLUE CROSS/BLUE SHIELD | Admitting: Diagnostic Neuroimaging

## 2015-10-05 ENCOUNTER — Telehealth: Payer: Self-pay

## 2015-10-05 NOTE — Telephone Encounter (Signed)
I spoke to patient and she is aware of results and recommendations. She currently takes an iron supplement but will increase it and start taking with orange juice.

## 2015-10-05 NOTE — Telephone Encounter (Signed)
-----   Message from Huston FoleySaima Athar, MD sent at 10/05/2015  1:22 PM EDT ----- Please call patient: labs look good, one result pending, will call if abnormal.  Diabetes marker was in the prediabetes range, which means that weight control, portion control and carbohydrate intake should be monitored. In addition, her ferritin level, which is the storage iron was a little bit below where we would like to see it for patients who have symptoms of restless leg syndrome. For this, she can try an over-the-counter iron supplement, once daily for a few weeks. Not everybody tolerates iron very well and it can cause constipation or diarrhea. It is better to take it with a little bit of orange juice because the vitamin C helps with absorption. She can follow the directions on the medication bottle or box. Even though She is not frankly anemic, lower storage iron or ferritin can exacerbate RLS symptoms. Otherwise, labs look good.  Huston FoleySaima Athar, MD, PhD [facility])

## 2015-10-05 NOTE — Progress Notes (Signed)
Quick Note:  Please call patient: labs look good, one result pending, will call if abnormal.  Diabetes marker was in the prediabetes range, which means that weight control, portion control and carbohydrate intake should be monitored. In addition, her ferritin level, which is the storage iron was a little bit below where we would like to see it for patients who have symptoms of restless leg syndrome. For this, she can try an over-the-counter iron supplement, once daily for a few weeks. Not everybody tolerates iron very well and it can cause constipation or diarrhea. It is better to take it with a little bit of orange juice because the vitamin C helps with absorption. She can follow the directions on the medication bottle or box. Even though She is not frankly anemic, lower storage iron or ferritin can exacerbate RLS symptoms. Otherwise, labs look good.  Huston FoleySaima Didier Brandenburg, MD, PhD [facility] ([facility]  ______

## 2015-10-06 LAB — MULTIPLE MYELOMA PANEL, SERUM
ALBUMIN SERPL ELPH-MCNC: 3.5 g/dL (ref 2.9–4.4)
ALPHA 1: 0.2 g/dL (ref 0.0–0.4)
Albumin/Glob SerPl: 1.1 (ref 0.7–1.7)
Alpha2 Glob SerPl Elph-Mcnc: 0.6 g/dL (ref 0.4–1.0)
B-Globulin SerPl Elph-Mcnc: 1.3 g/dL (ref 0.7–1.3)
GAMMA GLOB SERPL ELPH-MCNC: 1.2 g/dL (ref 0.4–1.8)
GLOBULIN, TOTAL: 3.3 g/dL (ref 2.2–3.9)
IGA/IMMUNOGLOBULIN A, SERUM: 222 mg/dL (ref 87–352)
IgG (Immunoglobin G), Serum: 1369 mg/dL (ref 700–1600)
IgM (Immunoglobulin M), Srm: 79 mg/dL (ref 26–217)
TOTAL PROTEIN: 6.8 g/dL (ref 6.0–8.5)

## 2015-10-06 LAB — CK: Total CK: 72 U/L (ref 24–173)

## 2015-10-06 LAB — HGB A1C W/O EAG: HEMOGLOBIN A1C: 6.2 % — AB (ref 4.8–5.6)

## 2015-10-06 LAB — IRON AND TIBC
Iron Saturation: 12 % — ABNORMAL LOW (ref 15–55)
Iron: 45 ug/dL (ref 27–139)
Total Iron Binding Capacity: 361 ug/dL (ref 250–450)
UIBC: 316 ug/dL (ref 118–369)

## 2015-10-06 LAB — B12 AND FOLATE PANEL
FOLATE: 18.2 ng/mL (ref >3.0)
Vitamin B-12: 825 pg/mL (ref 211–946)

## 2015-10-06 LAB — VITAMIN D 25 HYDROXY (VIT D DEFICIENCY, FRACTURES): VIT D 25 HYDROXY: 51.4 ng/mL (ref 30.0–100.0)

## 2015-10-06 LAB — SEDIMENTATION RATE: SED RATE: 35 mm/h (ref 0–40)

## 2015-10-06 LAB — FERRITIN: FERRITIN: 43 ng/mL (ref 15–150)

## 2015-10-06 LAB — ANA W/REFLEX: ANA: NEGATIVE

## 2015-10-06 LAB — C-REACTIVE PROTEIN: CRP: 7.4 mg/L — ABNORMAL HIGH (ref 0.0–4.9)

## 2015-10-06 LAB — RPR: RPR Ser Ql: NONREACTIVE

## 2015-10-06 LAB — VITAMIN B6: VITAMIN B6: 15.8 ug/L (ref 2.0–32.8)

## 2015-10-06 LAB — TSH: TSH: 2.52 u[IU]/mL (ref 0.450–4.500)

## 2015-10-12 ENCOUNTER — Ambulatory Visit
Admission: RE | Admit: 2015-10-12 | Discharge: 2015-10-12 | Disposition: A | Discharge status: Home / Self Care | Payer: BLUE CROSS/BLUE SHIELD | Source: Ambulatory Visit | Attending: Neurology | Admitting: Neurology

## 2015-10-12 DIAGNOSIS — G4733 Obstructive sleep apnea (adult) (pediatric): Secondary | ICD-10-CM

## 2015-10-12 DIAGNOSIS — R202 Paresthesia of skin: Secondary | ICD-10-CM

## 2015-10-12 DIAGNOSIS — M542 Cervicalgia: Secondary | ICD-10-CM | POA: Diagnosis not present

## 2015-10-12 DIAGNOSIS — G2581 Restless legs syndrome: Secondary | ICD-10-CM

## 2015-10-12 DIAGNOSIS — R351 Nocturia: Secondary | ICD-10-CM

## 2015-10-12 DIAGNOSIS — R42 Dizziness and giddiness: Secondary | ICD-10-CM

## 2015-10-12 DIAGNOSIS — F439 Reaction to severe stress, unspecified: Secondary | ICD-10-CM

## 2015-10-12 DIAGNOSIS — G4761 Periodic limb movement disorder: Secondary | ICD-10-CM

## 2015-10-12 MED ORDER — GADOBENATE DIMEGLUMINE 529 MG/ML IV SOLN
20.0000 mL | Freq: Once | INTRAVENOUS | Status: AC | PRN
  Administered 2015-10-12: 20 mL via INTRAVENOUS

## 2015-10-15 ENCOUNTER — Ambulatory Visit (INDEPENDENT_AMBULATORY_CARE_PROVIDER_SITE_OTHER): Payer: BLUE CROSS/BLUE SHIELD | Admitting: Diagnostic Neuroimaging

## 2015-10-15 ENCOUNTER — Encounter (INDEPENDENT_AMBULATORY_CARE_PROVIDER_SITE_OTHER): Payer: Self-pay | Admitting: Diagnostic Neuroimaging

## 2015-10-15 DIAGNOSIS — Z0289 Encounter for other administrative examinations: Secondary | ICD-10-CM

## 2015-10-15 DIAGNOSIS — R42 Dizziness and giddiness: Secondary | ICD-10-CM

## 2015-10-15 DIAGNOSIS — F439 Reaction to severe stress, unspecified: Secondary | ICD-10-CM

## 2015-10-15 DIAGNOSIS — R202 Paresthesia of skin: Secondary | ICD-10-CM | POA: Diagnosis not present

## 2015-10-15 DIAGNOSIS — R351 Nocturia: Secondary | ICD-10-CM

## 2015-10-15 DIAGNOSIS — G4733 Obstructive sleep apnea (adult) (pediatric): Secondary | ICD-10-CM

## 2015-10-15 DIAGNOSIS — G2581 Restless legs syndrome: Secondary | ICD-10-CM

## 2015-10-15 DIAGNOSIS — M542 Cervicalgia: Secondary | ICD-10-CM

## 2015-10-15 DIAGNOSIS — G4761 Periodic limb movement disorder: Secondary | ICD-10-CM

## 2015-10-16 NOTE — Procedures (Signed)
   [facility]  NCS (NERVE CONDUCTION STUDY) WITH EMG (ELECTROMYOGRAPHY) REPORT   STUDY DATE: 10/15/15 PATIENT NAME: Jasmine Contreras DOB: 07/21/1950 MRN: [id number]  ORDERING CLINICIAN: Huston FoleySaima Athar, MD PhD   TECHNOLOGIST: Gearldine ShownLorraine Jones  ELECTROMYOGRAPHER: Glenford BayleyVikram R. Penumalli, MD  CLINICAL INFORMATION: 65 year old female with right upper and right lower extremity numbness.  FINDINGS: NERVE CONDUCTION STUDY: Right median, right ulnar, bilateral peroneal and bilateral tibial motor responses and F wave latencies are normal. Bilateral H reflex responses are normal. Right median, right ulnar, bilateral peroneal sensory responses are normal.   NEEDLE ELECTROMYOGRAPHY: Needle examination of right upper extremity deltoid, biceps, triceps, flexor carpi radialis, first dorsal interosseous and right C4-5 and C6-7 paraspinal muscles is normal.   IMPRESSION:  Normal study. No electrodiagnostic evidence of large fiber neuropathy, cervical or lumbar radiculopathy at this time.    INTERPRETING PHYSICIAN:  Suanne MarkerVIKRAM R. PENUMALLI, MD Certified in Neurology, Neurophysiology and Neuroimaging  [facility] [address], [address] [place], [place] [id number] [phone]

## 2015-10-19 ENCOUNTER — Telehealth: Payer: Self-pay | Admitting: *Deleted

## 2015-10-19 NOTE — Telephone Encounter (Signed)
PT called office back. Spoke to pt about results per Dr Frances FurbishAthar note. Went over EMG/NCS and MRI cervical spine. Pt verbalized understanding.

## 2015-10-19 NOTE — Progress Notes (Signed)
Quick Note:  Please call and advise the patient that the recent EMG and nerve conduction velocity test, which is the electrical nerve and muscle test we we performed, was reported as within normal limits. We checked for abnormal electrical discharges in the muscles or nerves and the report suggested normal findings. No further action is required on this test at this time. Please remind patient to keep any upcoming appointments or tests and to call us with any interim questions, concerns, problems or updates. Thanks,  Lilja Soland, MD, PhD   ______ 

## 2015-10-19 NOTE — Telephone Encounter (Signed)
-----   Message from Huston FoleySaima Athar, MD sent at 10/19/2015  7:57 AM EDT ----- Please call and advise the patient that the recent EMG and nerve conduction velocity test, which is the electrical nerve and muscle test we we performed, was reported as within normal limits. We checked for abnormal electrical discharges in the muscles or nerves and the report suggested normal findings. No further action is required on this test at this time. Please remind patient to keep any upcoming appointments or tests and to call us with any interim questions, concerns, problems or updates. Thanks,  Huston FoleySaima Athar, MD, PhD

## 2015-10-19 NOTE — Progress Notes (Signed)
Quick Note:  Please call and advise the patient that the recent scan we did was within normal limits. We did a c spine MRI with and wo contrast, which showed minimal degenerative findings, nothing sinister. In particular, there were no acute findings, such as disc herniation or pinched nerve type changes. Normal enhancement pattern was seen with contrast administration. No further action is required on this test at this time. Please remind patient to keep any upcoming appointments or tests and to call us with any interim questions, concerns, problems or updates. Thanks,  Huston FoleySaima Maralee Higuchi, MD, PhD   ______

## 2015-10-19 NOTE — Telephone Encounter (Signed)
LVM for pt to call about results. Gave [facility] phone number.  

## 2016-01-01 ENCOUNTER — Other Ambulatory Visit: Payer: Self-pay | Admitting: Family

## 2016-01-01 DIAGNOSIS — M545 Low back pain: Secondary | ICD-10-CM

## 2016-01-04 ENCOUNTER — Ambulatory Visit: Payer: BLUE CROSS/BLUE SHIELD | Admitting: Neurology

## 2016-01-08 ENCOUNTER — Ambulatory Visit
Admission: RE | Admit: 2016-01-08 | Discharge: 2016-01-08 | Disposition: A | Discharge status: Home / Self Care | Payer: BLUE CROSS/BLUE SHIELD | Source: Ambulatory Visit | Attending: Family | Admitting: Family

## 2016-01-08 DIAGNOSIS — M545 Low back pain: Secondary | ICD-10-CM

## 2016-10-14 ENCOUNTER — Other Ambulatory Visit: Payer: Self-pay | Admitting: Family Medicine

## 2016-10-14 ENCOUNTER — Ambulatory Visit
Admission: RE | Admit: 2016-10-14 | Discharge: 2016-10-14 | Disposition: A | Discharge status: Home / Self Care | Payer: BLUE CROSS/BLUE SHIELD | Source: Ambulatory Visit | Attending: Family Medicine | Admitting: Family Medicine

## 2016-10-14 DIAGNOSIS — R0600 Dyspnea, unspecified: Secondary | ICD-10-CM

## 2017-02-27 ENCOUNTER — Other Ambulatory Visit: Payer: Self-pay | Admitting: Family Medicine

## 2017-02-27 ENCOUNTER — Ambulatory Visit
Admission: RE | Admit: 2017-02-27 | Discharge: 2017-02-27 | Disposition: A | Discharge status: Home / Self Care | Payer: BLUE CROSS/BLUE SHIELD | Source: Ambulatory Visit | Attending: Family Medicine | Admitting: Family Medicine

## 2017-02-27 DIAGNOSIS — Z1231 Encounter for screening mammogram for malignant neoplasm of breast: Secondary | ICD-10-CM

## 2017-08-06 ENCOUNTER — Encounter (HOSPITAL_COMMUNITY): Payer: Self-pay

## 2017-08-06 ENCOUNTER — Emergency Department (HOSPITAL_COMMUNITY): Payer: BLUE CROSS/BLUE SHIELD

## 2017-08-06 DIAGNOSIS — R002 Palpitations: Secondary | ICD-10-CM | POA: Diagnosis present

## 2017-08-06 DIAGNOSIS — Z79899 Other long term (current) drug therapy: Secondary | ICD-10-CM | POA: Insufficient documentation

## 2017-08-06 LAB — BASIC METABOLIC PANEL
Anion gap: 15 (ref 5–15)
BUN: 14 mg/dL (ref 6–20)
CHLORIDE: 103 mmol/L (ref 101–111)
CO2: 22 mmol/L (ref 22–32)
CREATININE: 0.97 mg/dL (ref 0.44–1.00)
Calcium: 9.9 mg/dL (ref 8.9–10.3)
GFR, EST NON AFRICAN AMERICAN: 60 mL/min — AB (ref >60)
Glucose, Bld: 171 mg/dL — ABNORMAL HIGH (ref 65–99)
Potassium: 3.8 mmol/L (ref 3.5–5.1)
Sodium: 140 mmol/L (ref 135–145)

## 2017-08-06 LAB — CBC
HCT: 39.6 % (ref 36.0–46.0)
Hemoglobin: 12.8 g/dL (ref 12.0–15.0)
MCH: 26.4 pg (ref 26.0–34.0)
MCHC: 32.3 g/dL (ref 30.0–36.0)
MCV: 81.8 fL (ref 78.0–100.0)
PLATELETS: 266 10*3/uL (ref 150–400)
RBC: 4.84 MIL/uL (ref 3.87–5.11)
RDW: 16.5 % — ABNORMAL HIGH (ref 11.5–15.5)
WBC: 7.9 10*3/uL (ref 4.0–10.5)

## 2017-08-06 LAB — I-STAT TROPONIN, ED: TROPONIN I, POC: 0.01 ng/mL (ref 0.00–0.08)

## 2017-08-07 ENCOUNTER — Other Ambulatory Visit: Payer: Self-pay

## 2017-08-07 ENCOUNTER — Emergency Department (HOSPITAL_COMMUNITY)
Admission: EM | Admit: 2017-08-07 | Discharge: 2017-08-07 | Disposition: A | Discharge status: Home / Self Care | Payer: BLUE CROSS/BLUE SHIELD | Attending: Emergency Medicine | Admitting: Emergency Medicine

## 2017-08-07 DIAGNOSIS — R002 Palpitations: Secondary | ICD-10-CM

## 2017-08-07 LAB — BRAIN NATRIURETIC PEPTIDE: B Natriuretic Peptide: 30 pg/mL (ref 0.0–100.0)

## 2017-08-07 LAB — URINALYSIS, ROUTINE W REFLEX MICROSCOPIC
Bacteria, UA: NONE SEEN
Bilirubin Urine: NEGATIVE
GLUCOSE, UA: NEGATIVE mg/dL
Ketones, ur: NEGATIVE mg/dL
Leukocytes, UA: NEGATIVE
NITRITE: NEGATIVE
PH: 5 (ref 5.0–8.0)
Protein, ur: NEGATIVE mg/dL
SPECIFIC GRAVITY, URINE: 1.008 (ref 1.005–1.030)

## 2017-08-07 NOTE — ED Notes (Signed)
Pt remains in waiting room. Updated on wait for treatment room. 

## 2017-08-07 NOTE — ED Notes (Signed)
Warm blanket given to pt

## 2017-08-07 NOTE — ED Provider Notes (Signed)
[facility] EMERGENCY DEPARTMENT Provider Note   CSN: [id number] Arrival date & time: 08/06/17  1806     History   Chief Complaint Chief Complaint  Patient presents with  . palpitations/weakness    HPI Jasmine Contreras is a 67 y.o. female.  Patient presents to the emergency department with chief complaint of palpitations.  States that she noticed palpitations today.  She states that it felt like her heart was "dancing." She reports having felt this sensation before.  She denies any pain or shortness of breath.  She has tried Valsalva with no relief.  She has not taken any medications for her symptoms.  She denies lightheadedness or syncope.  Denies any fever, chills, cough, nausea, or vomiting.   The history is provided by the patient. No language interpreter was used.    Past Medical History:  Diagnosis Date  . Anxiety   . Arthritis    trochanteric bursitis  . Depression   . GERD (gastroesophageal reflux disease)   . Headache   . Heart murmur   . Herpes   . Hiatal hernia   . Irritable bowel syndrome   . MVP (mitral valve prolapse)   . PUD (peptic ulcer disease)    s/p vagotomy  . Retaining fluid   . Varicose veins   . Vertigo   . Vitamin D deficiency     Patient Active Problem List   Diagnosis Date Noted  . Chest pain 08/28/2012  . Abnormal cardiovascular function study 08/28/2012    Past Surgical History:  Procedure Laterality Date  . BREAST EXCISIONAL BIOPSY Left   . CHOLECYSTECTOMY    . partial gastrectomy with vagotomy    . right arthroscopic knee surgery    . trochanteric bursectomy    . VAGINAL HYSTERECTOMY     with oophorectomy    OB History    No data available       Home Medications    Prior to Admission medications   Medication Sig Start Date End Date Taking? Authorizing Provider  albuterol (PROVENTIL HFA;VENTOLIN HFA) 108 (90 BASE) MCG/ACT inhaler Inhale 1-2 puffs into the lungs every 6 (six) hours as needed for  wheezing or shortness of breath. Or persistent coughing 03/09/14   Presson, Jess Barters H, PA  benzonatate (TESSALON) 100 MG capsule Take 1 capsule (100 mg total) by mouth 3 (three) times daily as needed for cough. 03/09/14   Presson, Mathis Fare, PA  BIOTIN FORTE PO Take 1 capsule by mouth every other day.     [provider]  Cholecalciferol (VITAMIN D-3 PO) Take 1,000 Int'l Units by mouth daily.    [provider]  colchicine 0.6 MG tablet Take 0.6 mg by mouth as needed. For gout flare ups    [provider]  esomeprazole (NEXIUM) 20 MG capsule Take 40 mg by mouth daily as needed.     [provider]  folic acid (FOLVITE) 400 MCG tablet Take 400 mcg by mouth daily.    [provider]  furosemide (LASIX) 40 MG tablet TK 2 T PO QAM AND TK 1 T PO AT NIGHT FOR 30 DAYS 08/19/15   [provider]  Garcinia Cambogia-Chromium 500-200 MG-MCG TABS Take 1 tablet by mouth 2 (two) times daily.    [provider]  hydroxypropyl methylcellulose (ISOPTO TEARS) 2.5 % ophthalmic solution Place 2 drops into both eyes 3 (three) times daily as needed. For dry eyes     [provider]  hyoscyamine (LEVSIN SL) 0.125 MG SL tablet Place 0.125 mg under the tongue every 4 (four) hours as needed. Stomach spasms    [provider]  metoprolol succinate (TOPROL-XL) 25 MG 24 hr tablet TK 1 T PO QD 09/24/15   [provider]  Misc Natural Products (HORNY GOAT WEED PO) Take 1 tablet by mouth daily.    [provider]  Misc Natural Products (PROGESTERONE EX) Apply 1 application topically as directed. Small amount to both thighs twice daily to prevent hot flashes    [provider]  Multiple Vitamins-Minerals (ALIVE WOMENS 50+ PO) Take 2 tablets by mouth daily.    [provider]  ondansetron (ZOFRAN) 4 MG tablet Take 1 tablet (4 mg total) by mouth every 6 (six) hours. 06/07/14   Charm Rings, MD  potassium chloride SA  (K-DUR,KLOR-CON) 20 MEQ tablet Take 20 mEq by mouth daily. 2 tablets daily    [provider]  scopolamine (TRANSDERM-SCOP) 1 MG/3DAYS Place 1 patch (1.5 mg total) onto the skin every 3 (three) days. 06/07/14   Charm Rings, MD  vitamin B-12 (CYANOCOBALAMIN) 1000 MCG tablet Take 1,000 mcg by mouth daily.      [provider]    Family History Family History  Problem Relation Age of Onset  . Alcohol abuse Father     Social History Social History   Tobacco Use  . Smoking status: Never Smoker  . Smokeless tobacco: Never Used  Substance Use Topics  . Alcohol use: Yes    Alcohol/week: 0.0 oz    Comment: 1 glass of wine with dinner  . Drug use: No     Allergies   Peanuts [peanut oil]; Abilify [aripiprazole]; Antivert [meclizine hcl]; Codeine; Hydrocodone; Oxycodone hcl; Prednisone; Scopace [scopolamine]; Shellfish allergy; Tavist-d [albertsons dayhist-d]; and Ultram [tramadol]   Review of Systems Review of Systems  All other systems reviewed and are negative.    Physical Exam Updated Vital Signs BP (!) 144/62   Pulse 64   Temp 98.2 F (36.8 C) (Oral)   Resp 16   SpO2 99%   Physical Exam  Constitutional: She is oriented to person, place, and time. She appears well-developed and well-nourished.  HENT:  Head: Normocephalic and atraumatic.  Eyes: Conjunctivae and EOM are normal. Pupils are equal, round, and reactive to light.  Neck: Normal range of motion. Neck supple.  Cardiovascular: Normal rate and regular rhythm. Exam reveals no gallop and no friction rub.  No murmur heard. Pulmonary/Chest: Effort normal and breath sounds normal. No respiratory distress. She has no wheezes. She has no rales. She exhibits no tenderness.  Abdominal: Soft. Bowel sounds are normal. She exhibits no distension and no mass. There is no tenderness. There is no rebound and no guarding.  Musculoskeletal: Normal range of motion. She exhibits no edema or tenderness.    Neurological: She is alert and oriented to person, place, and time.  Skin: Skin is warm and dry.  Psychiatric: She has a normal mood and affect. Her behavior is normal. Judgment and thought content normal.  Nursing note and vitals reviewed.    ED Treatments / Results  Labs (all labs ordered are listed, but only abnormal results are displayed) Labs Reviewed  BASIC METABOLIC PANEL - Abnormal; Notable for the following components:      Result Value   Glucose, Bld 171 (*)    GFR calc non Af Amer 60 (*)    All other components within normal limits  CBC - Abnormal;  Notable for the following components:   RDW 16.5 (*)    All other components within normal limits  URINALYSIS, ROUTINE W REFLEX MICROSCOPIC  I-STAT TROPONIN, ED    EKG  EKG Interpretation  Date/Time:  Sunday August 06 2017 18:11:18 EST Ventricular Rate:  114 PR Interval:  168 QRS Duration: 86 QT Interval:  320 QTC Calculation: 441 R Axis:   24 Text Interpretation:  Sinus tachycardia Possible Left atrial enlargement Nonspecific ST abnormality Abnormal ECG When compared with ECG of 08/10/2012, Nonspecific ST abnormality is now present Confirmed by Dione BoozeGlick, David ([id number]) on 08/07/2017 1:32:06 AM       Radiology Dg Chest 2 View  Result Date: 08/06/2017 CLINICAL DATA:  Irregular heartbeat today with tightness in the upper chest EXAM: CHEST  2 VIEW COMPARISON:  10/14/2016 FINDINGS: Tiny pleural effusions. Borderline cardiomegaly. Mild diffuse interstitial opacities suggesting minimal edema. Subsegmental atelectasis at the lingula and left base. No focal consolidation. No pneumothorax. Surgical clips at the GE junction. IMPRESSION: 1. Borderline to mild cardiomegaly. Mild diffuse interstitial opacities suggests minimal edema. Probable tiny pleural effusions. 2. Subsegmental atelectasis at the lingula and left base. Electronically Signed   By: Jasmine PangKim  Fujinaga M.D.   On: 08/06/2017 19:00    Procedures Procedures (including  critical care time)  Medications Ordered in ED Medications - No data to display   Initial Impression / Assessment and Plan / ED Course  I have reviewed the triage vital signs and the nursing notes.  Pertinent labs & imaging results that were available during my care of the patient were reviewed by me and considered in my medical decision making (see chart for details).     Patient with heart palpitations.  Symptoms started yesterday.  She has experienced this before.  Labs and EKG are reassuring.  Patient feeling improved on my exam.  Patient seen by and discussed with Dr. Preston FleetingGlick, who agrees with plan for discharge and PCP/cardiology follow-up.  Final Clinical Impressions(s) / ED Diagnoses   Final diagnoses:  Palpitations    ED Discharge Orders    None       Roxy HorsemanBrowning, Corrine Tillis, PA-C 08/07/17 [id number]    Dione BoozeGlick, David, MD 08/07/17 [id number]

## 2017-08-07 NOTE — Discharge Instructions (Signed)
Please discuss the palpitations with your doctor.  You will need to be referred to a cardiologist by your doctor.

## 2017-11-23 ENCOUNTER — Other Ambulatory Visit: Payer: Self-pay | Admitting: Family Medicine

## 2017-11-23 ENCOUNTER — Ambulatory Visit
Admission: RE | Admit: 2017-11-23 | Discharge: 2017-11-23 | Disposition: A | Discharge status: Home / Self Care | Payer: BLUE CROSS/BLUE SHIELD | Source: Ambulatory Visit | Attending: Family Medicine | Admitting: Family Medicine

## 2017-11-23 DIAGNOSIS — R05 Cough: Secondary | ICD-10-CM

## 2017-11-23 DIAGNOSIS — R059 Cough, unspecified: Secondary | ICD-10-CM

## 2018-01-16 ENCOUNTER — Other Ambulatory Visit (INDEPENDENT_AMBULATORY_CARE_PROVIDER_SITE_OTHER): Payer: BLUE CROSS/BLUE SHIELD

## 2018-01-16 ENCOUNTER — Ambulatory Visit: Payer: BLUE CROSS/BLUE SHIELD | Admitting: Internal Medicine

## 2018-01-16 ENCOUNTER — Encounter: Payer: Self-pay | Admitting: Internal Medicine

## 2018-01-16 VITALS — BP 130/80 | HR 60 | Ht 69.0 in | Wt 275.0 lb

## 2018-01-16 DIAGNOSIS — R05 Cough: Secondary | ICD-10-CM

## 2018-01-16 DIAGNOSIS — R0609 Other forms of dyspnea: Secondary | ICD-10-CM | POA: Diagnosis not present

## 2018-01-16 DIAGNOSIS — R058 Other specified cough: Secondary | ICD-10-CM

## 2018-01-16 LAB — CBC WITH DIFFERENTIAL/PLATELET
BASOS ABS: 0.1 10*3/uL (ref 0.0–0.1)
Basophils Relative: 1.3 % (ref 0.0–3.0)
Eosinophils Absolute: 0 10*3/uL (ref 0.0–0.7)
Eosinophils Relative: 0.4 % (ref 0.0–5.0)
HEMATOCRIT: 37.6 % (ref 36.0–46.0)
Hemoglobin: 12.3 g/dL (ref 12.0–15.0)
LYMPHS PCT: 22.7 % (ref 12.0–46.0)
Lymphs Abs: 2.1 10*3/uL (ref 0.7–4.0)
MCHC: 32.9 g/dL (ref 30.0–36.0)
MCV: 80.1 fl (ref 78.0–100.0)
Monocytes Absolute: 0.6 10*3/uL (ref 0.1–1.0)
Monocytes Relative: 6.3 % (ref 3.0–12.0)
NEUTROS ABS: 6.3 10*3/uL (ref 1.4–7.7)
NEUTROS PCT: 69.3 % (ref 43.0–77.0)
PLATELETS: 275 10*3/uL (ref 150.0–400.0)
RBC: 4.69 Mil/uL (ref 3.87–5.11)
RDW: 16.7 % — ABNORMAL HIGH (ref 11.5–15.5)
WBC: 9.1 10*3/uL (ref 4.0–10.5)

## 2018-01-16 LAB — NITRIC OXIDE: Nitric Oxide: 46

## 2018-01-16 MED ORDER — PANTOPRAZOLE SODIUM 40 MG PO TBEC: DELAYED_RELEASE_TABLET | ORAL | Status: AC

## 2018-01-16 MED ORDER — BENZONATATE 200 MG PO CAPS: 200.0000 mg | ORAL_CAPSULE | Freq: Three times a day (TID) | ORAL | 1 refills | Status: DC | PRN

## 2018-01-16 NOTE — Patient Instructions (Addendum)
Protonix 40 mg Take 30- 60 min before your first and last meals of the day    GERD (REFLUX)  is an extremely common cause of respiratory symptoms just like yours , many times with no obvious heartburn at all.    It can be treated with medication, but also with lifestyle changes including elevation of the head of your bed (ideally with 6 inch  bed blocks),  Smoking cessation, avoidance of late meals, excessive alcohol, and avoid fatty foods, chocolate, peppermint, colas, red wine, and acidic juices such as orange juice.  NO MINT OR MENTHOL PRODUCTS SO NO COUGH DROPS   USE SUGARLESS CANDY INSTEAD (Jolley ranchers or Stover's or Life Savers) or even ice chips will also do - the key is to swallow to prevent all throat clearing. NO OIL BASED VITAMINS - use powdered substitutes.  For drainage / throat tickle try take CHLORPHENIRAMINE  4 mg - take one every 4 hours as needed - available over the counter- may cause drowsiness so start with just a bedtime dose or two and see how you tolerate it before trying in daytime     Please remember to go to the lab department downstairs in the basement  for your tests - we will call you with the results when they are available.  Please schedule a follow up office visit in 4 weeks, sooner if needed  with all medications /inhalers/ solutions in hand so we can verify exactly what you are taking. This includes all medications from all doctors and over the counters - add check tsh next ov if not done

## 2018-01-16 NOTE — Progress Notes (Addendum)
Jasmine Contreras, female    DOB: 07/18/1950, 67 y.o.   MRN: [id number]   Brief patient profile: [id number] yobf never smoker ran track in HS until dislocated R hip and recovered but never did sports again and did well thu 3 term IUP's  last one in 1976 and p dropped the baby wt >>  baseline wt around 130 but by around 1999 weighed around 155 then  around 2010 stressed out re marital issues  and started gaining wt, retaining fluid then around winter 2019 after flu in Feb 2019 bad cough/fever/palpitations/some aches > all resolved x for cough and "heavy breathing" no different with walking and worse when lie down so referred to pulmonary clinic 01/16/2018 by Dr Laurann Montanaynthia  White       01/16/2018  [facility]/ Johnjoseph Rolfe  Chief Complaint  Patient presents with  . Pulmonary Consult    Referred by Dr. Laurann Montanaynthia White. Pt c/o SOB x 4-5 months. She states she is SOB all the time, with or without any exertion. She states "feels like I am breathing through a pillow".  She has some right side discomfort and SOB when she lies a certain way in bed. She has had a lingering, dry cough since Feb 2019.    Dyspnea:  At rest / worse immediately why lie down assoc with choking sensation2 Cough: dry sporadic/ worse immediately supine but thruout the day, worse with voice use, perfume exp Sleep: 45 degrees helps   No better with saba   No obvious patterns in day to day or daytime variability or assoc excess/ purulent sputum or mucus plugs or hemoptysis or cp or chest tightness, subjective wheeze or overt sinus or hb symptoms.   Sleeps at 45 degrees without nocturnal  or early am exacerbation  of respiratory  c/o's or need for noct saba. Also denies any obvious fluctuation of symptoms with weather or environmental changes or other aggravating or alleviating factors except as outlined above   No unusual exposure hx or h/o childhood pna/ asthma or knowledge of premature birth.  Current Allergies, Complete Past  Medical History, Past Surgical History, Family History, and Social History were reviewed in [facility] electronic medical record.  ROS  The following are not active complaints unless bolded Hoarseness, sore throat, dysphagia, dental problems, itching, sneezing,  nasal congestion or discharge of excess mucus or purulent secretions, ear ache,   fever, chills, sweats, unintended wt loss or wt gain, classically pleuritic or exertional cp,  orthopnea pnd or arm/hand swelling  or leg swelling, presyncope, palpitations, abdominal pain, anorexia, nausea, vomiting, diarrhea  or change in bowel habits or change in bladder habits, change in stools or change in urine, dysuria, hematuria,  rash, arthralgias, visual complaints, headache, numbness, weakness or ataxia or problems with walking or coordination,  change in mood or  memory.                   Past Medical History:  Diagnosis Date  . Anxiety   . Arthritis    trochanteric bursitis  . Depression   . GERD (gastroesophageal reflux disease)   . Headache   . Heart murmur   . Herpes   . Hiatal hernia   . Irritable bowel syndrome   . MVP (mitral valve prolapse)   . PUD (peptic ulcer disease)    s/p vagotomy  . Retaining fluid   . Varicose veins   . Vertigo   . Vitamin D deficiency  Outpatient Medications Prior to Visit  Medication Sig Dispense Refill  . BIOTIN FORTE PO Take 1 capsule by mouth every other day.     . cetirizine (ZYRTEC) 10 MG tablet Take 10 mg by mouth daily.    . Cholecalciferol (VITAMIN D-3 PO) Take 1,000 Int'l Units by mouth daily.    . colchicine 0.6 MG tablet Take 0.6 mg by mouth as needed. For gout flare ups    . EPINEPHrine 0.3 mg/0.3 mL IJ SOAJ injection Inject 0.3 mg into the muscle as needed.    . ferrous gluconate (IRON 27) 240 (27 FE) MG tablet Take 240 mg by mouth daily.    . hyoscyamine (LEVSIN SL) 0.125 MG SL tablet Place 0.125 mg under the tongue every 4 (four) hours as needed. Stomach spasms    .  indomethacin (INDOCIN) 50 MG capsule Take 50 mg by mouth 2 (two) times daily with a meal.    . metoprolol succinate (TOPROL-XL) 25 MG 24 hr tablet TK 1 T PO QD  5  . Misc Natural Products (HORNY GOAT WEED PO) Take 1 tablet by mouth daily.    . nitroGLYCERIN (NITROSTAT) 0.4 MG SL tablet Place 0.4 mg under the tongue every 5 (five) minutes as needed for chest pain.    . potassium chloride SA (K-DUR,KLOR-CON) 20 MEQ tablet Take 20 mEq by mouth 2 (two) times daily.     Marland Kitchen scopolamine (TRANSDERM-SCOP) 1 MG/3DAYS Place 1 patch (1.5 mg total) onto the skin every 3 (three) days. 10 patch 1  . torsemide (DEMADEX) 10 MG tablet Take 10 mg by mouth daily.    Marland Kitchen UNABLE TO FIND Med Name: CBL oil 2 drops at bedtime    . vitamin B-12 (CYANOCOBALAMIN) 1000 MCG tablet Take 1,000 mcg by mouth daily.      Marland Kitchen albuterol (PROVENTIL HFA;VENTOLIN HFA) 108 (90 BASE) MCG/ACT inhaler Inhale 1-2 puffs into the lungs every 6 (six) hours as needed for wheezing or shortness of breath. Or persistent coughing 1 Inhaler 0  . benzonatate (TESSALON) 100 MG capsule Take 1 capsule (100 mg total) by mouth 3 (three) times daily as needed for cough. 30 capsule 0  . esomeprazole (NEXIUM) 20 MG capsule Take 40 mg by mouth daily as needed.     . folic acid (FOLVITE) 400 MCG tablet Take 400 mcg by mouth daily.    . furosemide (LASIX) 40 MG tablet TK 2 T PO QAM AND TK 1 T PO AT NIGHT FOR 30 DAYS  11  . Garcinia Cambogia-Chromium 500-200 MG-MCG TABS Take 1 tablet by mouth 2 (two) times daily.    . hydroxypropyl methylcellulose (ISOPTO TEARS) 2.5 % ophthalmic solution Place 2 drops into both eyes 3 (three) times daily as needed. For dry eyes     . Misc Natural Products (PROGESTERONE EX) Apply 1 application topically as directed. Small amount to both thighs twice daily to prevent hot flashes    . Multiple Vitamins-Minerals (ALIVE WOMENS 50+ PO) Take 2 tablets by mouth daily.    . ondansetron (ZOFRAN) 4 MG tablet Take 1 tablet (4 mg total) by mouth  every 6 (six) hours. 30 tablet 0   No facility-administered medications prior to visit.              Objective:     BP 130/80 (BP Location: Left Arm, Cuff Size: Normal)   Pulse 60   Ht 5\' 9"  (1.753 m)   Wt 275 lb (124.7 kg)   SpO2 96%   BMI  40.61 kg/m   SpO2: 96 %  RA  Wt Readings from Last 3 Encounters:  01/16/18 275 lb (124.7 kg)  10/01/15 267 lb (121.1 kg)  05/01/15 270 lb (122.5 kg)     Vital signs reviewed - Note on arrival 02 sats  96% on RA  Pleasant mild/ mod  Hoarse obese amb bf nad except for freq throat clearing  HEENT: nl dentition, turbinates bilaterally, and oropharynx which is pristine. Nl external ear canals without cough reflex   NECK :  without JVD/Nodes/TM/ nl carotid upstrokes bilaterally   LUNGS: no acc muscle use,  Nl contour chest which is clear to A and P bilaterally without cough on insp or exp maneuvers   CV:  RRR  no s3 or murmur or increase in P2, and trace to 1+ pitting both ankles and feet   ABD:  soft and nontender with nl inspiratory excursion in the supine position. No bruits or organomegaly appreciated, bowel sounds nl  MS:  Nl gait/ ext warm without deformities, calf tenderness, cyanosis or clubbing No obvious joint restrictions   SKIN: warm and dry without lesions    NEURO:  alert, approp, nl sensorium with  no motor or cerebellar deficits apparent.     Labs ordered 01/16/2018  Allergy profile   Labs  Reviewed  (only new one is cbc with diff)      Chemistry      Component Value Date/Time   NA 140 08/06/2017 1834   K 3.8 08/06/2017 1834   CL 103 08/06/2017 1834   CO2 22 08/06/2017 1834   BUN 14 08/06/2017 1834   CREATININE 0.97 08/06/2017 1834      Component Value Date/Time   CALCIUM 9.9 08/06/2017 1834   ALKPHOS 98 08/09/2012 0845   AST 21 08/09/2012 0845   ALT 15 08/09/2012 0845   BILITOT 0.6 08/09/2012 0845        Lab Results  Component Value Date   WBC 9.1 01/16/2018   HGB 12.3 01/16/2018   HCT  37.6 01/16/2018   MCV 80.1 01/16/2018   PLT 275.0 01/16/2018       EOS                                                              0.0                                    01/16/2018         DDIMER                                                      0.39                                     12/22/17   Lab Results  Component Value Date   TSH 2.520 10/01/2015     Lab Results  Component Value Date   PROBNP 97.0 06/07/2008  Assessment   Upper airway cough syndrome FENO 01/16/2018  =   46 - Allergy profile 01/16/2018 >  Eos 0.0 /  IgE   - cyclical cough regimen 01/16/2018   Symptoms of resting sob/ orthopnea not reproduced / proportionate to activity all started with uri/flu like syndrome c/w uacs  Upper airway cough syndrome (previously labeled PNDS),  is so named because it's frequently impossible to sort out how much is  CR/sinusitis with freq throat clearing (which can be related to primary GERD)   vs  causing  secondary (" extra esophageal")  GERD from wide swings in gastric pressure that occur with throat clearing, often  promoting self use of mint and menthol lozenges that reduce the lower esophageal sphincter tone and exacerbate the problem further in a cyclical fashion.   These are the same pts (now being labeled as having "irritable larynx syndrome" by some cough centers) who not infrequently have a history of having failed to tolerate ace inhibitors,  dry powder inhalers or biphosphonates or report having atypical/extraesophageal reflux symptoms that don't respond to standard doses of PPI  and are easily confused as having aecopd or asthma flares by even experienced allergists/ pulmonologists (myself included).   Of the three most common causes of  Sub-acute / recurrent or chronic cough, only one (GERD)  can actually contribute to/ trigger  the other two (asthma and post nasal drip syndrome)  and perpetuate the cylce of cough.  While not intuitively obvious,  many patients with chronic low grade reflux do not cough until there is a primary insult that disturbs the protective epithelial barrier and exposes sensitive nerve endings.   This is typically viral but can due to PNDS and  either may apply here.    The point is that once this occurs, it is difficult to eliminate the cycle  using anything but a maximally effective acid suppression regimen at least in the short run, accompanied by an appropriate diet to address non acid GERD and control / eliminate all pnds with 1st gen H1 blockers per guidelines  Then regroup in 4 weeks, sooner if needed, and do allergy profile in meantime.     DOE (dyspnea on exertion) Onset feb 2019 p bad uri - Spirometry 01/16/2018  Nl off all rx with no curvature to f/v - 01/16/2018   Walked RA  2 laps @ 185 ft each stopped due to  Sob at fast pace, no desats   Symptoms are markedly disproportionate to objective findings and not clear to what extent this is actually a pulmonary  problem but pt does appear to have difficult to sort out respiratory symptoms of unknown origin for which  DDX  = almost all start with A and  include Adherence, Ace Inhibitors, Acid Reflux, Active Sinus Disease, Alpha 1 Antitripsin deficiency, Anxiety masquerading as Airways dz,  ABPA,  Allergy(esp in young), Aspiration (esp in elderly), Adverse effects of meds,  Active smokers, A bunch of PE's/clot burden (a few small clots can't cause this syndrome unless there is already severe underlying pulm or vascular dz with poor reserve),  Anemia or thyroid disorder, plus two Bs  = Bronchiectasis and Beta blocker use..and one C= CHF     Adherence is always the initial "prime suspect" and is a multilayered concern that requires a "trust but verify" approach in every patient - starting with knowing how to use medications, especially inhalers, correctly, keeping up with refills and understanding the fundamental difference between maintenance and prns vs  those  medications only taken for a very short course and then stopped and not refilled.  - return with all meds in hand using a trust but verify approach to confirm accurate Medication  Reconciliation The principal here is that until we are certain that the  patients are doing what we've asked, it makes no sense to ask them to do more.    ? Acid (or non-acid) GERD > always difficult to exclude as up to 75% of pts in some series report no assoc GI/ Heartburn symptoms> rec max (24h)  acid suppression and diet restrictions/ reviewed and instructions given in writing.  See uacs   ? Allergy/asthma > send profile / no need to rx empirically for now as very unlikely   ? Anxiety/depression/ deconditioning/obesity > usually at the bottom of this list of usual suspects but should be much higher on this pt's based on H and P and  may interfere with adherence and also interpretation of response or lack thereof to symptom management which can be quite subjective.   ? A bunch of PEs :  D dimer nl - while a normal  Value   may miss small peripheral pe, the clot burden with sob is moderately high and the d dimer  has a very high neg pred value if used in this setting.    ? Anemia/ thyroid dz > ruled out anemia but don't see recent tsh in records here or at Dr Lucilla [facility] - consider at f/u ov   chf > excluded by very low d dimer   Morbid obesity due to excess calories (HCC) Body mass index is 40.61 kg/m.  -  trending up  Lab Results  Component Value Date   TSH 2.520 10/01/2015     Contributing to gerd risk/ doe/reviewed the need and the process to achieve and maintain neg calorie balance > defer f/u primary care including intermittently monitoring thyroid status       Total time devoted to counseling  > 50 % of initial 60 min office visit:  review case with pt/ discussion of options/alternatives/ personally creating written customized instructions  in presence of pt  then going over those specific  Instructions  directly with the pt including how to use all of the meds but in particular covering each new medication in detail and the difference between the maintenance= "automatic" meds and the prns using an action plan format for the latter (If this problem/symptom => do that organization reading Left to right).  Please see AVS from this visit for a full list of these instructions which I personally wrote for this pt and  are unique to this visit.      Sandrea Hughs, MD 01/16/2018

## 2018-01-17 ENCOUNTER — Telehealth: Payer: Self-pay | Admitting: Internal Medicine

## 2018-01-17 ENCOUNTER — Encounter: Payer: Self-pay | Admitting: Internal Medicine

## 2018-01-17 DIAGNOSIS — R0609 Other forms of dyspnea: Secondary | ICD-10-CM

## 2018-01-17 LAB — RESPIRATORY ALLERGY PROFILE REGION II ~~LOC~~
ALLERGEN, D PTERNOYSSINUS, D1: 0.41 kU/L — AB
Allergen, A. alternata, m6: <0.1 kU/L
Allergen, Cedar tree, t12: <0.1 kU/L
Allergen, Mulberry, t76: <0.1 kU/L
Allergen, Oak,t7: <0.1 kU/L
Allergen, P. notatum, m1: <0.1 kU/L
Aspergillus fumigatus, m3: <0.1 kU/L
Bermuda Grass: 0.6 kU/L — ABNORMAL HIGH
Box Elder IgE: <0.1 kU/L
CLADOSPORIUM HERBARUM (M2) IGE: <0.1 kU/L
CLASS: 0
CLASS: 0
CLASS: 0
CLASS: 0
CLASS: 0
CLASS: 0
CLASS: 0
CLASS: 0
CLASS: 1
CLASS: 2
Cat Dander: <0.1 kU/L
Class: 0
Class: 0
Class: 0
Class: 0
Class: 0
Class: 0
Class: 0
Class: 0
Class: 0
Class: 0
Class: 0
Class: 1
Class: 1
Class: 2
Cockroach: <0.1 kU/L
D. FARINAE: 0.42 kU/L — AB
Dog Dander: <0.1 kU/L
IgE (Immunoglobulin E), Serum: 70 kU/L (ref <=114)
Johnson Grass: 0.73 kU/L — ABNORMAL HIGH
Rough Pigweed  IgE: <0.1 kU/L
Sheep Sorrel IgE: <0.1 kU/L
Timothy Grass: 2.48 kU/L — ABNORMAL HIGH

## 2018-01-17 LAB — INTERPRETATION:

## 2018-01-17 MED ORDER — PREDNISONE 10 MG PO TABS: ORAL_TABLET | ORAL | 0 refills | Status: DC

## 2018-01-17 NOTE — Telephone Encounter (Signed)
Spoke with pt. States that she had a reaction to Chlorpheniramine. Reports itching during the night last night. Denies hives or rash. Pt wants to know if she can Loratadine along with Chlorpheniramine to help with the itching.  MW - please advise. Thanks!

## 2018-01-17 NOTE — Assessment & Plan Note (Addendum)
Onset feb 2019 p bad uri - Spirometry 01/16/2018  Nl off all rx with no curvature to f/v - 01/16/2018   Walked RA  2 laps @ 185 ft each stopped due to  Sob at fast pace, no desats   Symptoms are markedly disproportionate to objective findings and not clear to what extent this is actually a pulmonary  problem but pt does appear to have difficult to sort out respiratory symptoms of unknown origin for which  DDX  = almost all start with A and  include Adherence, Ace Inhibitors, Acid Reflux, Active Sinus Disease, Alpha 1 Antitripsin deficiency, Anxiety masquerading as Airways dz,  ABPA,  Allergy(esp in young), Aspiration (esp in elderly), Adverse effects of meds,  Active smokers, A bunch of PE's/clot burden (a few small clots can't cause this syndrome unless there is already severe underlying pulm or vascular dz with poor reserve),  Anemia or thyroid disorder, plus two Bs  = Bronchiectasis and Beta blocker use..and one C= CHF     Adherence is always the initial "prime suspect" and is a multilayered concern that requires a "trust but verify" approach in every patient - starting with knowing how to use medications, especially inhalers, correctly, keeping up with refills and understanding the fundamental difference between maintenance and prns vs those medications only taken for a very short course and then stopped and not refilled.  - return with all meds in hand using a trust but verify approach to confirm accurate Medication  Reconciliation The principal here is that until we are certain that the  patients are doing what we've asked, it makes no sense to ask them to do more.    ? Acid (or non-acid) GERD > always difficult to exclude as up to 75% of pts in some series report no assoc GI/ Heartburn symptoms> rec max (24h)  acid suppression and diet restrictions/ reviewed and instructions given in writing.  See uacs   ? Allergy/asthma > send profile / no need to rx empirically for now as very unlikely   ?  Anxiety/depression/ deconditioning/obesity > usually at the bottom of this list of usual suspects but should be much higher on this pt's based on H and P and  may interfere with adherence and also interpretation of response or lack thereof to symptom management which can be quite subjective.   ? A bunch of PEs :  D dimer nl - while a normal  Value   may miss small peripheral pe, the clot burden with sob is moderately high and the d dimer  has a very high neg pred value if used in this setting.    ? Anemia/ thyroid dz > ruled out anemia but don't see recent tsh in records here or at [facility] - consider at f/u ov   chf  - generally excluded in this setting by BNP < 100

## 2018-01-17 NOTE — Telephone Encounter (Signed)
Spoke with pt. She is aware MW's recommendation. Rx has been sent in for prednisone. Nothing further was needed.

## 2018-01-17 NOTE — Progress Notes (Signed)
Spoke with pt and notified of results per Dr. Wert. Pt verbalized understanding and denied any questions. 

## 2018-01-17 NOTE — Assessment & Plan Note (Signed)
FENO 01/16/2018  =   46 - Allergy profile 01/16/2018 >  Eos 0.0 /  IgE   - cyclical cough regimen 01/16/2018   Symptoms of resting sob/ orthopnea not reproduced / proportionate to activity all started with uri/flu like syndrome c/w uacs  Upper airway cough syndrome (previously labeled PNDS),  is so named because it's frequently impossible to sort out how much is  CR/sinusitis with freq throat clearing (which can be related to primary GERD)   vs  causing  secondary (" extra esophageal")  GERD from wide swings in gastric pressure that occur with throat clearing, often  promoting self use of mint and menthol lozenges that reduce the lower esophageal sphincter tone and exacerbate the problem further in a cyclical fashion.   These are the same pts (now being labeled as having "irritable larynx syndrome" by some cough centers) who not infrequently have a history of having failed to tolerate ace inhibitors,  dry powder inhalers or biphosphonates or report having atypical/extraesophageal reflux symptoms that don't respond to standard doses of PPI  and are easily confused as having aecopd or asthma flares by even experienced allergists/ pulmonologists (myself included).   Of the three most common causes of  Sub-acute / recurrent or chronic cough, only one (GERD)  can actually contribute to/ trigger  the other two (asthma and post nasal drip syndrome)  and perpetuate the cylce of cough.  While not intuitively obvious, many patients with chronic low grade reflux do not cough until there is a primary insult that disturbs the protective epithelial barrier and exposes sensitive nerve endings.   This is typically viral but can due to PNDS and  either may apply here.    The point is that once this occurs, it is difficult to eliminate the cycle  using anything but a maximally effective acid suppression regimen at least in the short run, accompanied by an appropriate diet to address non acid GERD and control / eliminate  all pnds with 1st gen H1 blockers per guidelines  Then regroup in 4 weeks, sooner if needed, and do allergy profile in meantime.

## 2018-01-17 NOTE — Assessment & Plan Note (Addendum)
Body mass index is 40.61 kg/m.  -  trending up  Lab Results  Component Value Date   TSH 2.520 10/01/2015     Contributing to gerd risk/ doe/reviewed the need and the process to achieve and maintain neg calorie balance > defer f/u primary care including intermittently monitoring thyroid status       Total time devoted to counseling  > 50 % of initial 60 min office visit:  review case with pt/ discussion of options/alternatives/ personally creating written customized instructions  in presence of pt  then going over those specific  Instructions directly with the pt including how to use all of the meds but in particular covering each new medication in detail and the difference between the maintenance= "automatic" meds and the prns using an action plan format for the latter (If this problem/symptom => do that organization reading Left to right).  Please see AVS from this visit for a full list of these instructions which I personally wrote for this pt and  are unique to this visit.

## 2018-01-17 NOTE — Telephone Encounter (Signed)
Sorry to hear that - the best thing to do is stop the chlorpheniramine and take zyrtec 10 mg now and each night at bedtime and may help the itching now and the cough long term   If not better would take Prednisone 10 mg take  4 each am x 2 days,   2 each am x 2 days,  1 each am x 2 days and stop (give her rx so she has on hand to activate if needed )

## 2018-02-23 ENCOUNTER — Ambulatory Visit: Payer: Self-pay | Admitting: Internal Medicine

## 2018-02-23 ENCOUNTER — Ambulatory Visit: Payer: BLUE CROSS/BLUE SHIELD | Admitting: Internal Medicine

## 2018-02-23 ENCOUNTER — Encounter: Payer: Self-pay | Admitting: Internal Medicine

## 2018-02-23 VITALS — BP 130/90 | HR 68 | Ht 69.0 in | Wt 276.0 lb

## 2018-02-23 DIAGNOSIS — R058 Other specified cough: Secondary | ICD-10-CM

## 2018-02-23 DIAGNOSIS — R05 Cough: Secondary | ICD-10-CM | POA: Diagnosis not present

## 2018-02-23 DIAGNOSIS — R0609 Other forms of dyspnea: Secondary | ICD-10-CM | POA: Diagnosis not present

## 2018-02-23 NOTE — Patient Instructions (Signed)
Try pepcid 20 mg at bedtime x at least 2 weeks to see if your chest heaviness is better in the morning (available over the counter for short term use to see if it helps)   Be careful with indomethacin and any gout medications like it  (advil / alleve) as they can irritate your GI tract - take with meals only   Pulmonary follow is as needed

## 2018-02-23 NOTE — Progress Notes (Signed)
Jasmine Contreras, female    DOB: 12/07/1950, 67 y.o.   MRN: [id number]   Brief patient profile: 43 yobf never smoker ran track in HS until dislocated R hip and recovered but never did sports again and did well thu 3 term IUP's  last one in 1976 and p dropped the baby wt >>  baseline wt around 130 but by around 1999 weighed around 155 then  around 2010 stressed out re marital issues  and started gaining wt, retaining fluid then around winter 2019 after flu in Feb 2019 bad cough/fever/palpitations/some aches > all resolved x for cough and "heavy breathing" no different with walking and worse when lie down so referred to pulmonary clinic 01/16/2018 by Dr Laurann Montana      History of Present Illness  01/16/2018  1st pulmonary eval  Chief Complaint  Patient presents with  . Pulmonary Consult    Referred by Dr. Laurann Montana. Pt c/o SOB x 4-5 months. She states she is SOB all the time, with or without any exertion. She states "feels like I am breathing through a pillow".  She has some right side discomfort and SOB when she lies a certain way in bed. She has had a lingering, dry cough since Feb 2019.    Dyspnea:  At rest / worse immediately why lie down assoc with choking sensation2 Cough: dry sporadic/ worse immediately supine but thruout the day, worse with voice use, perfume exp Sleep: 45 degrees helps  No better with saba  Sleeps at 45 degrees  rec Protonix 40 mg Take 30- 60 min before your first and last meals of the day  GERD diet  For drainage / throat tickle try take CHLORPHENIRAMINE  4 mg - hives > changed to zyretc     02/23/2018  f/u ov/Dreshaun Stene re: uacs  Chief Complaint  Patient presents with  . Follow-up    Doing well coughing very little.  Dyspnea:  Walking at mall / [facility] = can't walk a nl pace on a flat grade s sob but does fine slow and flat   Cough resolved Sleeping: one pillow on side/ horizontal when wake up feels heavy in chest x 4 hours most days but better as day goes  on and not reproduced p 10 am by an activity including exertion       No obvious day to day or daytime variability or assoc excess/ purulent sputum or mucus plugs or hemoptysis or cp or chest tightness, subjective wheeze or overt sinus or hb symptoms.   Sleeping as above  without nocturnal  or early am exacerbation  of respiratory  c/o's or need for noct saba. Also denies any obvious fluctuation of symptoms with weather or environmental changes or other aggravating or alleviating factors except as outlined above   No unusual exposure hx or h/o childhood pna/ asthma or knowledge of premature birth.  Current Allergies, Complete Past Medical History, Past Surgical History, Family History, and Social History were reviewed in [employer] record.  ROS  The following are not active complaints unless bolded Hoarseness, sore throat, dysphagia, dental problems, itching, sneezing,  nasal congestion or discharge of excess mucus or purulent secretions, ear ache,   fever, chills, sweats, unintended wt loss or wt gain, classically pleuritic or exertional cp,  orthopnea pnd or arm/hand swelling  or leg swelling, presyncope, palpitations, abdominal pain, anorexia, nausea, vomiting, diarrhea  or change in bowel habits or change in bladder habits, change in stools or  change in urine, dysuria, hematuria,  rash, arthralgias, visual complaints, headache, numbness, weakness or ataxia or problems with walking or coordination,  change in mood or  memory.        Current Meds  Medication Sig  . benzonatate (TESSALON) 200 MG capsule Take 1 capsule (200 mg total) by mouth 3 (three) times daily as needed for cough.  Marland Kitchen. BIOTIN FORTE PO Take 1 capsule by mouth every other day.   . Cholecalciferol (VITAMIN D-3 PO) Take 1,000 Int'l Units by mouth daily.  . colchicine 0.6 MG tablet Take 0.6 mg by mouth as needed. For gout flare ups  . EPINEPHrine 0.3 mg/0.3 mL IJ SOAJ injection Inject 0.3 mg into the muscle  as needed.  . ferrous gluconate (IRON 27) 240 (27 FE) MG tablet Take 240 mg by mouth daily.  . hyoscyamine (LEVSIN SL) 0.125 MG SL tablet Place 0.125 mg under the tongue every 4 (four) hours as needed. Stomach spasms  . indomethacin (INDOCIN) 50 MG capsule Take 50 mg by mouth 2 (two) times daily with a meal.  . metoprolol succinate (TOPROL-XL) 25 MG 24 hr tablet TK 1 T PO QD  . Misc Natural Products (HORNY GOAT WEED PO) Take 1 tablet by mouth daily.  . nitroGLYCERIN (NITROSTAT) 0.4 MG SL tablet Place 0.4 mg under the tongue every 5 (five) minutes as needed for chest pain.  . pantoprazole (PROTONIX) 40 MG tablet Take 30- 60 min before your first and last meals of the day  . potassium chloride SA (K-DUR,KLOR-CON) 20 MEQ tablet Take 20 mEq by mouth 2 (two) times daily.   . predniSONE (DELTASONE) 10 MG tablet 4 each am x 2 days,  2 each am x 2 days, 1 each am x 2 days and stop  . scopolamine (TRANSDERM-SCOP) 1 MG/3DAYS Place 1 patch (1.5 mg total) onto the skin every 3 (three) days.  Marland Kitchen. torsemide (DEMADEX) 10 MG tablet Take 10 mg by mouth daily.  Marland Kitchen. UNABLE TO FIND Med Name: CBL oil 2 drops at bedtime  . vitamin B-12 (CYANOCOBALAMIN) 1000 MCG tablet Take 1,000 mcg by mouth daily.               Objective:     am bf nad    02/23/2018      276   01/16/18 275 lb (124.7 kg)  10/01/15 267 lb (121.1 kg)  05/01/15 270 lb (122.5 kg)     Vital signs reviewed - Note on arrival 02 sats  97% on RA      HEENT: nl dentition, turbinates bilaterally, and oropharynx. Nl external ear canals without cough reflex   NECK :  without JVD/Nodes/TM/ nl carotid upstrokes bilaterally   LUNGS: no acc muscle use,  Nl contour chest which is clear to A and P bilaterally without cough on insp or exp maneuvers   CV:  RRR  no s3 or murmur or increase in P2, and trace edema both ankles   ABD:  soft and nontender with nl inspiratory excursion in the supine position. No bruits or organomegaly appreciated, bowel sounds  nl  MS:  Nl gait/ ext warm without deformities, calf tenderness, cyanosis or clubbing No obvious joint restrictions   SKIN: warm and dry without lesions    NEURO:  alert, approp, nl sensorium with  no motor or cerebellar deficits apparent.                Assessment

## 2018-02-25 ENCOUNTER — Encounter: Payer: Self-pay | Admitting: Internal Medicine

## 2018-02-25 NOTE — Assessment & Plan Note (Signed)
Onset feb 2019 p bad uri - Spirometry 01/16/2018  Nl off all rx with no curvature to f/v - 01/16/2018   Walked RA  2 laps @ 185 ft each stopped due to  Sob at fast pace, no desats   Most likely related to obesity/ conditioning/ reviewed

## 2018-02-25 NOTE — Assessment & Plan Note (Addendum)
FENO 01/16/2018  =   46 - Allergy profile 01/16/2018 >  Eos 0.0 /  IgE  70  Grass/Dust   - cyclical cough regimen 01/16/2018 > much better 02/23/2018   I had an extended final summary discussion with the patient reviewing all relevant studies completed to date and  lasting 15 to 20 minutes of a 25 minute visit on the following issues:    Much improved x for am "congestion" sensation s excess mucus which may represent noct GERD issues so re-inforced diet and rec addition of Pepcid at hs but no further pulmonary w/u for now   Did advise caution with use of nsaids if turns out this is gerd related "congestion" and may benefit from GI eval at Dr Lucilla LameWhite's discretion   Each maintenance medication was reviewed in detail including most importantly the difference between maintenance and as needed and under what circumstances the prns are to be used.  Please see AVS for specific  Instructions which are unique to this visit and I personally typed out  which were reviewed in detail in writing with the patient and a copy provided.

## 2018-02-25 NOTE — Assessment & Plan Note (Signed)
Body mass index is 40.76 kg/m.  -  trending up slowly  Lab Results  Component Value Date   TSH 2.520 10/01/2015     Contributing to gerd risk/ doe/reviewed the need and the process to achieve and maintain neg calorie balance > defer f/u primary care including intermittently monitoring thyroid status

## 2018-03-20 ENCOUNTER — Encounter (INDEPENDENT_AMBULATORY_CARE_PROVIDER_SITE_OTHER): Payer: BLUE CROSS/BLUE SHIELD

## 2018-03-28 ENCOUNTER — Encounter (INDEPENDENT_AMBULATORY_CARE_PROVIDER_SITE_OTHER): Payer: Self-pay | Admitting: Bariatrics

## 2018-03-28 ENCOUNTER — Ambulatory Visit (INDEPENDENT_AMBULATORY_CARE_PROVIDER_SITE_OTHER): Payer: BLUE CROSS/BLUE SHIELD | Admitting: Bariatrics

## 2018-03-28 VITALS — BP 119/70 | HR 62 | Temp 98.3°F | Ht 68.0 in | Wt 273.0 lb

## 2018-03-28 DIAGNOSIS — R0602 Shortness of breath: Secondary | ICD-10-CM | POA: Diagnosis not present

## 2018-03-28 DIAGNOSIS — E559 Vitamin D deficiency, unspecified: Secondary | ICD-10-CM

## 2018-03-28 DIAGNOSIS — F3289 Other specified depressive episodes: Secondary | ICD-10-CM

## 2018-03-28 DIAGNOSIS — Z0289 Encounter for other administrative examinations: Secondary | ICD-10-CM

## 2018-03-28 DIAGNOSIS — Z6841 Body Mass Index (BMI) 40.0 and over, adult: Secondary | ICD-10-CM

## 2018-03-28 DIAGNOSIS — R600 Localized edema: Secondary | ICD-10-CM | POA: Diagnosis not present

## 2018-03-28 DIAGNOSIS — E538 Deficiency of other specified B group vitamins: Secondary | ICD-10-CM

## 2018-03-28 DIAGNOSIS — Z9189 Other specified personal risk factors, not elsewhere classified: Secondary | ICD-10-CM

## 2018-03-28 DIAGNOSIS — R5383 Other fatigue: Secondary | ICD-10-CM | POA: Diagnosis not present

## 2018-03-29 LAB — COMPREHENSIVE METABOLIC PANEL
A/G RATIO: 1.6 (ref 1.2–2.2)
ALT: 16 IU/L (ref 0–32)
AST: 15 IU/L (ref 0–40)
Albumin: 4.2 g/dL (ref 3.6–4.8)
Alkaline Phosphatase: 105 IU/L (ref 39–117)
BUN/Creatinine Ratio: 13 (ref 12–28)
BUN: 9 mg/dL (ref 8–27)
Bilirubin Total: 0.9 mg/dL (ref 0.0–1.2)
CALCIUM: 8.8 mg/dL (ref 8.7–10.3)
CO2: 27 mmol/L (ref 20–29)
CREATININE: 0.7 mg/dL (ref 0.57–1.00)
Chloride: 100 mmol/L (ref 96–106)
GFR, EST AFRICAN AMERICAN: 104 mL/min/{1.73_m2} (ref >59)
GFR, EST NON AFRICAN AMERICAN: 90 mL/min/{1.73_m2} (ref >59)
Globulin, Total: 2.7 g/dL (ref 1.5–4.5)
Glucose: 87 mg/dL (ref 65–99)
POTASSIUM: 3.7 mmol/L (ref 3.5–5.2)
SODIUM: 141 mmol/L (ref 134–144)
TOTAL PROTEIN: 6.9 g/dL (ref 6.0–8.5)

## 2018-03-29 LAB — T3: T3, Total: 104 ng/dL (ref 71–180)

## 2018-03-29 LAB — LIPID PANEL WITH LDL/HDL RATIO
Cholesterol, Total: 165 mg/dL (ref 100–199)
HDL: 64 mg/dL (ref >39)
LDL Calculated: 86 mg/dL (ref 0–99)
LDL/HDL RATIO: 1.3 ratio (ref 0.0–3.2)
TRIGLYCERIDES: 77 mg/dL (ref 0–149)
VLDL CHOLESTEROL CAL: 15 mg/dL (ref 5–40)

## 2018-03-29 LAB — VITAMIN B12: Vitamin B-12: 1364 pg/mL — ABNORMAL HIGH (ref 232–1245)

## 2018-03-29 LAB — VITAMIN D 25 HYDROXY (VIT D DEFICIENCY, FRACTURES): VIT D 25 HYDROXY: 51.3 ng/mL (ref 30.0–100.0)

## 2018-03-29 LAB — HEMOGLOBIN A1C
Est. average glucose Bld gHb Est-mCnc: 131 mg/dL
Hgb A1c MFr Bld: 6.2 % — ABNORMAL HIGH (ref 4.8–5.6)

## 2018-03-29 LAB — FOLATE: FOLATE: 16.5 ng/mL (ref >3.0)

## 2018-03-29 LAB — INSULIN, RANDOM: INSULIN: 7.4 u[IU]/mL (ref 2.6–24.9)

## 2018-03-29 LAB — TSH: TSH: 2.31 u[IU]/mL (ref 0.450–4.500)

## 2018-03-29 LAB — T4, FREE: Free T4: 1.43 ng/dL (ref 0.82–1.77)

## 2018-04-02 NOTE — Progress Notes (Addendum)
Office: [phone]  /  Fax: [phone]   Dear Dr. Cliffton Asters,   Thank you for referring Jasmine Contreras to our clinic. The following note includes my evaluation and treatment recommendations.  HPI:   Chief Complaint: OBESITY    Jasmine Contreras has been referred by Jasmine Montana, MD for consultation regarding her obesity and obesity related comorbidities.    Jasmine Contreras (MR# [id number]) is a 67 y.o. female who presents on 03/28/2018 for obesity evaluation and treatment. Current BMI is Body mass index is 41.51 kg/m.Marland Kitchen Jasmine Contreras has been struggling with her weight for many years and has been unsuccessful in either losing weight, maintaining weight loss, or reaching her healthy weight goal.     Jasmine Contreras eats out 3-4 times per week. She notes cravings before lunch, and may snack after dinner. She denies polyphagia.     Jasmine Contreras attended our information session and states she is currently in the action stage of change and ready to dedicate time achieving and maintaining a healthier weight. Jasmine Contreras is interested in becoming our patient and working on intensive lifestyle modifications including (but not limited to) diet, exercise and weight loss.    Jasmine Contreras states her family eats meals together she struggles with family and or coworkers weight loss sabotage her desired weight loss is 88 lbs she started gaining weight between 1995 and 2005 her heaviest weight ever was 280 lbs she has significant food craving issues she snacks frequently in the evenings she is frequently drinking liquids with calories she frequently makes poor food choices she frequently eats larger portions than normal she has binge eating behaviors she struggles with emotional eating   Fatigue Jasmine Contreras feels her energy is lower than it should be. This has worsened with weight gain and has worsened recently. Jasmine Contreras admits to daytime somnolence and  admits to waking up still tired. Patient is at risk for  obstructive sleep apnea. Patent has a history of symptoms of morning fatigue. Patient generally gets 5 or 6 hours of sleep per night, and states they generally have difficulty falling back asleep if awakened. Snoring is present. Apneic episodes are not present.  Dyspnea on exertion Jasmine Contreras notes increasing shortness of breath with exercising and seems to be worsening over time with weight gain. She notes getting out of breath sooner with activity than she used to. This has gotten worse recently. Jasmine Contreras denies orthopnea.  Lower Extremity Edema Jasmine Contreras is taking Torsemide and she has no open wounds.  Vitamin B12 Deficiency Jasmine Contreras has a diagnosis of B12 insufficiency and notes fatigue. She is taking OTC Vitamin B12. She is not a vegetarian and does not have a previous diagnosis of pernicious anemia. She does not have a history of weight loss surgery.   Vitamin D Deficiency Jasmine Contreras has a diagnosis of vitamin D deficiency. She is currently taking OTC Vit D and denies nausea, vomiting or muscle weakness.  Depression with emotional eating behaviors Jasmine Contreras is struggling with emotional eating and using food for comfort to the extent that it is negatively impacting her health. She often snacks when she is not hungry. Jasmine Contreras sometimes feels she is out of control and then feels guilty that she made poor food choices. She has been working on behavior modification techniques to help reduce her emotional eating and has been minimally successful. She shows no sign of suicidal or homicidal ideations.  Depression screen Jasmine Contreras 2/9 03/28/2018  Decreased Interest 3  Down, Depressed, Hopeless 3  PHQ - 2 Score 6  Altered sleeping  3  Tired, decreased energy 3  Change in appetite 3  Feeling bad or failure about yourself  3  Trouble concentrating 3  Moving slowly or fidgety/restless 1  Suicidal thoughts 0  PHQ-9 Score 22  Difficult doing work/chores Somewhat difficult   Depression  Screen Jasmine Contreras's Food and Mood (modified PHQ-9) score was  Depression screen PHQ 2/9 03/28/2018  Decreased Interest 3  Down, Depressed, Hopeless 3  PHQ - 2 Score 6  Altered sleeping 3  Tired, decreased energy 3  Change in appetite 3  Feeling bad or failure about yourself  3  Trouble concentrating 3  Moving slowly or fidgety/restless 1  Suicidal thoughts 0  PHQ-9 Score 22  Difficult doing work/chores Somewhat difficult   At risk for diabetes Jasmine Contreras is at higher than average risk for developing diabetes due to her obesity. She currently denies polyuria or polydipsia.  ALLERGIES: Allergies  Allergen Reactions  . Peanuts [Peanut Oil] Hives  . Abilify [Aripiprazole]     somnelence  . Antivert [Meclizine Hcl] Nausea Only  . Codeine Itching and Nausea And Vomiting  . Honey Bee Treatment [Bee Venom]   . Hydrocodone Itching and Nausea And Vomiting  . Loratadine   . Oxycodone Hcl Itching and Nausea And Vomiting  . Prednisone     Red face  . Scopace [Scopolamine] Nausea Only  . Sertraline     Suicidal thoughts  . Shellfish Allergy Itching  . Tavist-D [[employer]berts[employer] Dayhist-D]     unknown  . Ultram [Tramadol] Nausea Only    MEDICATIONS: Current Outpatient Medications on File Prior to Visit  Medication Sig Dispense Refill  . BIOTIN FORTE PO Take 1 capsule by mouth every other day.     . Cholecalciferol (VITAMIN D-3 PO) Take 1,000 Int'l Units by mouth daily.    . colchicine 0.6 MG tablet Take 0.6 mg by mouth as needed. For gout flare ups    . EPINEPHrine 0.3 mg/0.3 mL IJ SOAJ injection Inject 0.3 mg into the muscle as needed.    . ferrous gluconate (IRON 27) 240 (27 FE) MG tablet Take 240 mg by mouth daily.    . hyoscyamine (LEVSIN SL) 0.125 MG SL tablet Place 0.125 mg under the tongue every 4 (four) hours as needed. Stomach spasms    . indomethacin (INDOCIN) 50 MG capsule Take 50 mg by mouth 2 (two) times daily with a meal.    . metoprolol succinate (TOPROL-XL) 25 MG 24 hr  tablet TK 1 T PO QD  5  . nitroGLYCERIN (NITROSTAT) 0.4 MG SL tablet Place 0.4 mg under the tongue every 5 (five) minutes as needed for chest pain.    . pantoprazole (PROTONIX) 40 MG tablet Take 30- 60 min before your first and last meals of the day    . potassium chloride SA (K-DUR,KLOR-CON) 20 MEQ tablet Take 20 mEq by mouth 2 (two) times daily.     Marland Kitchen scopolamine (TRANSDERM-SCOP) 1 MG/3DAYS Place 1 patch (1.5 mg total) onto the skin every 3 (three) days. 10 patch 1  . torsemide (DEMADEX) 10 MG tablet Take 10 mg by mouth daily.    Marland Kitchen UNABLE TO FIND Med Name: CBL oil 2 drops at bedtime    . vitamin B-12 (CYANOCOBALAMIN) 1000 MCG tablet Take 1,000 mcg by mouth daily.       No current facility-administered medications on file prior to visit.     PAST MEDICAL HISTORY: Past Medical History:  Diagnosis Date  . Acid reflux   . Anxiety   .  Arthritis    trochanteric bursitis  . Back pain   . Chest pain   . Cough   . Depression   . Diarrhea   . Difficulty swallowing   . Dizziness   . Double vision   . Dry mouth   . Fatigue   . GERD (gastroesophageal reflux disease)   . Gout   . Headache   . Heart murmur   . Herpes   . Hiatal hernia   . Irritable bowel syndrome   . Itching   . Joint pain   . MVP (mitral valve prolapse)   . Neck stiffness   . Palpitations   . PUD (peptic ulcer disease)    s/p vagotomy  . Retaining fluid   . Ringing in ears   . Shortness of breath   . Stress   . Swelling of extremity   . Swelling of finger   . TIA (transient ischemic attack)   . Varicose veins   . Vertigo   . Vision changes   . Vitamin D deficiency   . Wheezing     PAST SURGICAL HISTORY: Past Surgical History:  Procedure Laterality Date  . BREAST EXCISIONAL BIOPSY Left   . CHOLECYSTECTOMY    . FOOT SURGERY    . partial gastrectomy with vagotomy    . right arthroscopic knee surgery    . trochanteric bursectomy    . VAGINAL HYSTERECTOMY     with oophorectomy    SOCIAL  HISTORY: Social History   Tobacco Use  . Smoking status: Never Smoker  . Smokeless tobacco: Never Used  Substance Use Topics  . Alcohol use: Yes    Alcohol/week: 0.0 standard drinks    Comment: 1 glass of wine with dinner  . Drug use: No    FAMILY HISTORY: Family History  Problem Relation Age of Onset  . Diabetes Mother   . Hypertension Mother   . Kidney disease Mother   . Alcohol abuse Father   . Hyperlipidemia Father   . Hypertension Father   . Stroke Father   . Depression Father   . Sleep apnea Father     ROS: Review of Systems  Constitutional: Positive for malaise/fatigue. Negative for weight loss.       + Trouble sleeping + Breast pain (chocolate)  HENT: Positive for ear discharge, ear pain, nosebleeds, sinus pain and tinnitus.        + Nasal stuffiness + Nasal discharge + Hay fever + Difficult or painful swallowing + Dry mouth + Hoarseness  Eyes: Positive for blurred vision and double vision.       + Vision changes + Wear glasses or contacts  Respiratory: Positive for cough, shortness of breath (with exertion) and wheezing.   Cardiovascular: Positive for palpitations. Negative for orthopnea.       + Chest tightness + Very cold feet or hands  Gastrointestinal: Positive for diarrhea and heartburn. Negative for nausea and vomiting.       + Swallowing difficulty  Genitourinary: Negative for frequency.  Musculoskeletal:       Negative muscle weakness + Neck stiffness  Skin: Positive for itching.       + Hair or nail changes  Neurological: Positive for dizziness and headaches (recently).  Endo/Heme/Allergies: Negative for polydipsia. Bruises/bleeds easily.       Negative polyphagia  Psychiatric/Behavioral: Positive for depression. Negative for suicidal ideas.       + Stress    PHYSICAL EXAM: Blood pressure 119/70, pulse 62, temperature 98.3  F (36.8 C), temperature source Oral, height 5\' 8"  (1.727 m), weight 273 lb (123.8 kg), SpO2 98 %. Body mass index  is 41.51 kg/m. Physical Exam  Constitutional: She is oriented to person, place, and time. She appears well-developed and well-nourished.  HENT:  Head: Normocephalic and atraumatic.  Nose: Nose normal.  Eyes: EOM are normal. No scleral icterus.  Neck: Normal range of motion. Neck supple. No thyromegaly present.  + Mallampati= 3  Cardiovascular: Normal rate and regular rhythm.  Pulmonary/Chest: Effort normal. No respiratory distress.  Abdominal: Soft. There is no tenderness.  + Obesity  Musculoskeletal:  Range of Motion normal in all 4 extremities 1+ edema noted in bilateral lower extremities  Neurological: She is alert and oriented to person, place, and time. Coordination normal.  Skin: Skin is warm and dry.  Psychiatric: She has a normal mood and affect. Her behavior is normal.  Vitals reviewed.   RECENT LABS AND TESTS: BMET    Component Value Date/Time   NA 141 03/28/2018 1032   K 3.7 03/28/2018 1032   CL 100 03/28/2018 1032   CO2 27 03/28/2018 1032   GLUCOSE 87 03/28/2018 1032   GLUCOSE 171 (H) 08/06/2017 1834   BUN 9 03/28/2018 1032   CREATININE 0.70 03/28/2018 1032   CALCIUM 8.8 03/28/2018 1032   GFRNONAA 90 03/28/2018 1032   GFRAA 104 03/28/2018 1032   Lab Results  Component Value Date   HGBA1C 6.2 (H) 03/28/2018   Lab Results  Component Value Date   INSULIN 7.4 03/28/2018   CBC    Component Value Date/Time   WBC 9.1 01/16/2018 1652   RBC 4.69 01/16/2018 1652   HGB 12.3 01/16/2018 1652   HCT 37.6 01/16/2018 1652   PLT 275.0 01/16/2018 1652   MCV 80.1 01/16/2018 1652   MCH 26.4 08/06/2017 1834   MCHC 32.9 01/16/2018 1652   RDW 16.7 (H) 01/16/2018 1652   LYMPHSABS 2.1 01/16/2018 1652   MONOABS 0.6 01/16/2018 1652   EOSABS 0.0 01/16/2018 1652   BASOSABS 0.1 01/16/2018 1652   Iron/TIBC/Ferritin/ %Sat    Component Value Date/Time   IRON 45 10/01/2015 1003   TIBC 361 10/01/2015 1003   FERRITIN 43 10/01/2015 1003   IRONPCTSAT 12 (L) 10/01/2015 1003    Lipid Panel     Component Value Date/Time   CHOL 165 03/28/2018 1032   TRIG 77 03/28/2018 1032   HDL 64 03/28/2018 1032   LDLCALC 86 03/28/2018 1032   Hepatic Function Panel     Component Value Date/Time   PROT 6.9 03/28/2018 1032   ALBUMIN 4.2 03/28/2018 1032   AST 15 03/28/2018 1032   ALT 16 03/28/2018 1032   ALKPHOS 105 03/28/2018 1032   BILITOT 0.9 03/28/2018 1032   BILIDIR 0.1 08/09/2012 0845   IBILI 0.5 08/09/2012 0845      Component Value Date/Time   TSH 2.310 03/28/2018 1032   TSH 2.520 10/01/2015 1003    ECG  shows NSR with a rate of 68 BPM INDIRECT CALORIMETER done today shows a VO2 of 180 and a REE of 1255.  Her calculated basal metabolic rate is 4098 thus her basal metabolic rate is worse than expected.    ASSESSMENT AND PLAN: Other fatigue - Plan: EKG 12-Lead, Comprehensive metabolic panel, Hemoglobin A1c, Insulin, random, Lipid Panel With LDL/HDL Ratio, T3, T4, free, TSH, Folate  Shortness of breath on exertion  Lower extremity edema  B12 nutritional deficiency - Plan: Vitamin B12  Vitamin D deficiency - Plan: VITAMIN  D 25 Hydroxy (Vit-D Deficiency, Fractures)  Other depression - with emotional eating  At risk for diabetes mellitus  Class 3 severe obesity with serious comorbidity and body mass index (BMI) of 40.0 to 44.9 in adult, unspecified obesity type (HCC)  PLAN:  Fatigue Jasmine Contreras was informed that her fatigue may be related to obesity, depression or many other causes. Labs will be ordered, and in the meanwhile Jasmine Contreras has agreed to work on diet, exercise and weight loss to help with fatigue. Proper sleep hygiene was discussed including the need for 7-8 hours of quality sleep each night. A sleep study was not ordered based on symptoms and Epworth score.  Dyspnea on exertion Jasmine Contreras's shortness of breath appears to be obesity related and exercise induced. She has agreed to work on weight loss and will begin exercise later, and slowly  increase cardio and resistance to treat her exercise induced shortness of breath. If Jasmine Contreras follows our instructions and loses weight without improvement of her shortness of breath, we will plan to refer to pulmonology. We will monitor this condition regularly. Jasmine Contreras agrees to this plan.  Lower Extremity Edema Jasmine Contreras is to decrease sodium intake and increase adequate water intake. Laelah agrees to follow up with our clinic in 2 weeks.  Vitamin B12 Deficiency Arleatha will work on increasing B12 rich foods in her diet. B12 supplementation was not prescribed today. We will check labs today and Lanasia agrees to follow up with our clinic in 2 weeks.  Vitamin D Deficiency Ozetta was informed that low vitamin D levels contributes to fatigue and are associated with obesity, breast, and colon cancer. Ludene agrees to continue taking OTC Vit D at this time and will follow up for routine testing of vitamin D, at least 2-3 times per year. She was informed of the risk of over-replacement of vitamin D and agrees to not increase her dose unless she discusses this with [place] first. We will check labs today and Toree agrees to follow up with our clinic in 2 weeks.  Depression with Emotional Eating Behaviors We discussed behavior modification techniques and cognitive behavioral therapy strategies today to help Shenica deal with her emotional eating and depression. We will refer to Dr. Dewaine Conger, our bariatric psychologist. Remell agrees to follow up with our clinic in 2 weeks.  Depression Screen Ezmeralda had a strongly positive depression screening. Depression is commonly associated with obesity and often results in emotional eating behaviors. We will monitor this closely and work on CBT to help improve the non-hunger eating patterns. Referral to Psychology may be required if no improvement is seen as she continues in our clinic.  Diabetes risk counselling Jadelyn was given extended (15  minutes) diabetes prevention counseling today. She is 67 y.o. female and has risk factors for diabetes including obesity. We discussed intensive lifestyle modifications today with an emphasis on weight loss as well as increasing exercise and decreasing simple carbohydrates in her diet.  Obesity Keairra is currently in the action stage of change and her goal is to continue with weight loss efforts. I recommend Marilea begin the structured treatment plan as follows:  She has agreed to follow the Category 1 plan Albirta has been instructed to eventually work up to a goal of 150 minutes of combined cardio and strengthening exercise per week for weight loss and overall health benefits. We discussed the following Behavioral Modification Strategies today: increasing lean protein intake, decreasing simple carbohydrates, increasing vegetables, decreasing sodium intake, decrease eating out and work on meal planning  and easy cooking plans, increase H20 intake, and no skipping meals Xoey will start reading her food labels.   She was informed of the importance of frequent follow up visits to maximize her success with intensive lifestyle modifications for her multiple health conditions. She was informed we would discuss her lab results at her next visit unless there is a critical issue that needs to be addressed sooner. Mykaila agreed to keep her next visit at the agreed upon time to discuss these results.    OBESITY BEHAVIORAL INTERVENTION VISIT  Today's visit was # 1   Starting weight: 273 lbs Starting date: 03/28/18 Today's weight : 273 lbs  Today's date: 03/28/2018 Total lbs lost to date: 0    ASK: We discussed the diagnosis of obesity with Tennis Must Behney today and Zsofia agreed to give [place] permission to discuss obesity behavioral modification therapy today.  ASSESS: Chester has the diagnosis of obesity and her BMI today is 41.52 Alani is in the action stage of change    ADVISE: Kaijah was educated on the multiple health risks of obesity as well as the benefit of weight loss to improve her health. She was advised of the need for long term treatment and the importance of lifestyle modifications to improve her current health and to decrease her risk of future health problems.  AGREE: Multiple dietary modification options and treatment options were discussed and  Elayah agreed to follow the recommendations documented in the above note.  ARRANGE: Yailen was educated on the importance of frequent visits to treat obesity as outlined per CMS and USPSTF guidelines and agreed to schedule her next follow up appointment today.  Trude Mcburney, am acting as transcriptionist for [employer], DO  I have reviewed the above documentation for accuracy and completeness, and I agree with the above. -Corinna Capra, DO

## 2018-04-11 ENCOUNTER — Encounter (INDEPENDENT_AMBULATORY_CARE_PROVIDER_SITE_OTHER): Payer: Self-pay | Admitting: Bariatrics

## 2018-04-11 ENCOUNTER — Ambulatory Visit (INDEPENDENT_AMBULATORY_CARE_PROVIDER_SITE_OTHER): Payer: BLUE CROSS/BLUE SHIELD | Admitting: Bariatrics

## 2018-04-11 VITALS — BP 132/88 | HR 79 | Temp 98.4°F | Ht 68.0 in | Wt 264.0 lb

## 2018-04-11 DIAGNOSIS — R7303 Prediabetes: Secondary | ICD-10-CM

## 2018-04-11 DIAGNOSIS — Z8349 Family history of other endocrine, nutritional and metabolic diseases: Secondary | ICD-10-CM | POA: Diagnosis not present

## 2018-04-11 DIAGNOSIS — Z6841 Body Mass Index (BMI) 40.0 and over, adult: Secondary | ICD-10-CM

## 2018-04-11 DIAGNOSIS — F3289 Other specified depressive episodes: Secondary | ICD-10-CM

## 2018-04-16 NOTE — Progress Notes (Signed)
Office: [phone]  /  Fax: [phone]   HPI:   Chief Complaint: OBESITY Jasmine Contreras is here to discuss her progress with her obesity treatment plan. She is on the Category 1 plan and is following her eating plan approximately 95 % of the time. She states she is exercising 0 minutes 0 times per week. Jasmine Contreras is doing ok, but is thinking that she is allergic to eggs which caused itching. She stopped and had an episode of gout secondary to an increased amount of meat. She states that she had an official allergy test and is allergic to peanut butter, peaches, peanuts, and shellfish.  Her weight is 264 lb (119.7 kg) today and has had a weight loss of 9 pounds over a period of 2 weeks since her last visit. She has lost 9 lbs since starting treatment with [place].  Depression with emotional eating behaviors Jasmine Contreras is struggling with emotional eating and using food for comfort to the extent that it is negatively impacting her health. She often snacks when she is not hungry. Jasmine Contreras sometimes feels she is out of control and then feels guilty that she made poor food choices. Her last PHQ-9 score was 22 on 03/28/18.  Depression screen PHQ 2/9 03/28/2018  Decreased Interest 3  Down, Depressed, Hopeless 3  PHQ - 2 Score 6  Altered sleeping 3  Tired, decreased energy 3  Change in appetite 3  Feeling bad or failure about yourself  3  Trouble concentrating 3  Moving slowly or fidgety/restless 1  Suicidal thoughts 0  PHQ-9 Score 22  Difficult doing work/chores Somewhat difficult   Pre-Diabetes Jasmine Contreras has a diagnosis of pre-diabetes based on her elevated Hgb A1c and was informed this puts her at greater risk of developing diabetes. Her Hgb A1c was 6.2 and Insulin was 7.4 on 03/28/18. She is not taking metformin currently and continues to work on diet and exercise to decrease risk of diabetes.   History of B12 Deficiency Jasmine Contreras has a history of B12 insufficiency and is taking OTC  B12.  ALLERGIES: Allergies  Allergen Reactions  . Peanuts [Peanut Oil] Hives  . Abilify [Aripiprazole]     somnelence  . Antivert [Meclizine Hcl] Nausea Only  . Codeine Itching and Nausea And Vomiting  . Honey Bee Treatment [Bee Venom]   . Hydrocodone Itching and Nausea And Vomiting  . Loratadine   . Oxycodone Hcl Itching and Nausea And Vomiting  . Prednisone     Red face  . Scopace [Scopolamine] Nausea Only  . Sertraline     Suicidal thoughts  . Shellfish Allergy Itching  . Tavist-D [Albertsons Dayhist-D]     unknown  . Ultram [Tramadol] Nausea Only    MEDICATIONS: Current Outpatient Medications on File Prior to Visit  Medication Sig Dispense Refill  . BIOTIN FORTE PO Take 1 capsule by mouth every other day.     . Cholecalciferol (VITAMIN D-3 PO) Take 1,000 Int'l Units by mouth daily.    . colchicine 0.6 MG tablet Take 0.6 mg by mouth as needed. For gout flare ups    . EPINEPHrine 0.3 mg/0.3 mL IJ SOAJ injection Inject 0.3 mg into the muscle as needed.    . ferrous gluconate (IRON 27) 240 (27 FE) MG tablet Take 240 mg by mouth daily.    . hyoscyamine (LEVSIN SL) 0.125 MG SL tablet Place 0.125 mg under the tongue every 4 (four) hours as needed. Stomach spasms    . indomethacin (INDOCIN) 50 MG capsule Take 50  mg by mouth 2 (two) times daily with a meal.    . metoprolol succinate (TOPROL-XL) 25 MG 24 hr tablet TK 1 T PO QD  5  . nitroGLYCERIN (NITROSTAT) 0.4 MG SL tablet Place 0.4 mg under the tongue every 5 (five) minutes as needed for chest pain.    . pantoprazole (PROTONIX) 40 MG tablet Take 30- 60 min before your first and last meals of the day    . potassium chloride SA (K-DUR,KLOR-CON) 20 MEQ tablet Take 20 mEq by mouth 2 (two) times daily.     Marland Kitchen scopolamine (TRANSDERM-SCOP) 1 MG/3DAYS Place 1 patch (1.5 mg total) onto the skin every 3 (three) days. 10 patch 1  . torsemide (DEMADEX) 10 MG tablet Take 10 mg by mouth daily.    Marland Kitchen UNABLE TO FIND Med Name: CBL oil 2 drops at  bedtime    . vitamin B-12 (CYANOCOBALAMIN) 1000 MCG tablet Take 1,000 mcg by mouth daily.       No current facility-administered medications on file prior to visit.     PAST MEDICAL HISTORY: Past Medical History:  Diagnosis Date  . Acid reflux   . Anxiety   . Arthritis    trochanteric bursitis  . Back pain   . Chest pain   . Cough   . Depression   . Diarrhea   . Difficulty swallowing   . Dizziness   . Double vision   . Dry mouth   . Fatigue   . GERD (gastroesophageal reflux disease)   . Gout   . Headache   . Heart murmur   . Herpes   . Hiatal hernia   . Irritable bowel syndrome   . Itching   . Joint pain   . MVP (mitral valve prolapse)   . Neck stiffness   . Palpitations   . PUD (peptic ulcer disease)    s/p vagotomy  . Retaining fluid   . Ringing in ears   . Shortness of breath   . Stress   . Swelling of extremity   . Swelling of finger   . TIA (transient ischemic attack)   . Varicose veins   . Vertigo   . Vision changes   . Vitamin D deficiency   . Wheezing     PAST SURGICAL HISTORY: Past Surgical History:  Procedure Laterality Date  . BREAST EXCISIONAL BIOPSY Left   . CHOLECYSTECTOMY    . FOOT SURGERY    . partial gastrectomy with vagotomy    . right arthroscopic knee surgery    . trochanteric bursectomy    . VAGINAL HYSTERECTOMY     with oophorectomy    SOCIAL HISTORY: Social History   Tobacco Use  . Smoking status: Never Smoker  . Smokeless tobacco: Never Used  Substance Use Topics  . Alcohol use: Yes    Alcohol/week: 0.0 standard drinks    Comment: 1 glass of wine with dinner  . Drug use: No    FAMILY HISTORY: Family History  Problem Relation Age of Onset  . Diabetes Mother   . Hypertension Mother   . Kidney disease Mother   . Alcohol abuse Father   . Hyperlipidemia Father   . Hypertension Father   . Stroke Father   . Depression Father   . Sleep apnea Father     ROS: Review of Systems  Constitutional: Positive for  weight loss.  Skin: Positive for itching.  Psychiatric/Behavioral: Positive for depression.    PHYSICAL EXAM: Blood pressure 132/88, pulse  79, temperature 98.4 F (36.9 C), temperature source Oral, height 5\' 8"  (1.727 m), weight 264 lb (119.7 kg), SpO2 98 %. Body mass index is 40.14 kg/m. Physical Exam  Constitutional: She is oriented to person, place, and time. She appears well-developed and well-nourished.  Cardiovascular: Normal rate.  Pulmonary/Chest: Effort normal.  Musculoskeletal: Normal range of motion.  Neurological: She is oriented to person, place, and time.  Skin: Skin is warm and dry.  Psychiatric: She has a normal mood and affect. Her behavior is normal.  Vitals reviewed.   RECENT LABS AND TESTS: BMET    Component Value Date/Time   NA 141 03/28/2018 1032   K 3.7 03/28/2018 1032   CL 100 03/28/2018 1032   CO2 27 03/28/2018 1032   GLUCOSE 87 03/28/2018 1032   GLUCOSE 171 (H) 08/06/2017 1834   BUN 9 03/28/2018 1032   CREATININE 0.70 03/28/2018 1032   CALCIUM 8.8 03/28/2018 1032   GFRNONAA 90 03/28/2018 1032   GFRAA 104 03/28/2018 1032   Lab Results  Component Value Date   HGBA1C 6.2 (H) 03/28/2018   HGBA1C 6.2 (H) 10/01/2015   Lab Results  Component Value Date   INSULIN 7.4 03/28/2018   CBC    Component Value Date/Time   WBC 9.1 01/16/2018 1652   RBC 4.69 01/16/2018 1652   HGB 12.3 01/16/2018 1652   HCT 37.6 01/16/2018 1652   PLT 275.0 01/16/2018 1652   MCV 80.1 01/16/2018 1652   MCH 26.4 08/06/2017 1834   MCHC 32.9 01/16/2018 1652   RDW 16.7 (H) 01/16/2018 1652   LYMPHSABS 2.1 01/16/2018 1652   MONOABS 0.6 01/16/2018 1652   EOSABS 0.0 01/16/2018 1652   BASOSABS 0.1 01/16/2018 1652   Iron/TIBC/Ferritin/ %Sat    Component Value Date/Time   IRON 45 10/01/2015 1003   TIBC 361 10/01/2015 1003   FERRITIN 43 10/01/2015 1003   IRONPCTSAT 12 (L) 10/01/2015 1003   Lipid Panel     Component Value Date/Time   CHOL 165 03/28/2018 1032   TRIG  77 03/28/2018 1032   HDL 64 03/28/2018 1032   LDLCALC 86 03/28/2018 1032   Hepatic Function Panel     Component Value Date/Time   PROT 6.9 03/28/2018 1032   ALBUMIN 4.2 03/28/2018 1032   AST 15 03/28/2018 1032   ALT 16 03/28/2018 1032   ALKPHOS 105 03/28/2018 1032   BILITOT 0.9 03/28/2018 1032   BILIDIR 0.1 08/09/2012 0845   IBILI 0.5 08/09/2012 0845      Component Value Date/Time   TSH 2.310 03/28/2018 1032   TSH 2.520 10/01/2015 1003   Results for ANTIA, RAHAL (MRN [id number]) as of 04/16/2018 08:43  Ref. Range 03/28/2018 10:32  Vitamin D, 25-Hydroxy Latest Ref Range: 30.0 - 100.0 ng/mL 51.3    ASSESSMENT AND PLAN: Prediabetes  Other depression - with emotional eating  Family history of B12 deficiency  Class 3 severe obesity with serious comorbidity and body mass index (BMI) of 40.0 to 44.9 in adult, unspecified obesity type (HCC)  PLAN:  Pre-Diabetes Jasmine Contreras will continue to work on weight loss, exercise, and decreasing simple carbohydrates in her diet to help decrease the risk of diabetes. We discussed pre-diabetes in detail with me and our registered dietitian. She was informed that eating too many simple carbohydrates or too many calories at one sitting increases the likelihood of GI side effects. Jasmine Contreras agreed to follow up with [place] as directed to monitor her progress in 2 weeks.  History of B12 Deficiency Jasmine Contreras  agrees to decrease B12 supplementation at this time and follow up in 2 weeks.  Depression with Emotional Eating Behaviors We discussed cognitive behavior therapy strategies today to help Jasmine Contreras deal with her emotional eating and depression. We also discussed a  referral to Dr. Dewaine Conger, our bariatric psychologist for evaluation due to elevated PHQ-9 score and significant struggles with emotional eating. Jasmine Contreras declines and appointment at this time and will follow up at the agreed upon time.  I spent > than 50% of the 15 minute visit on  counseling as documented in the note.  Obesity Jasmine Contreras is currently in the action stage of change. As such, her goal is to continue with weight loss efforts. She has agreed to follow the Category 1 plan and will find acceptable meat options for protein. Jasmine Contreras has been instructed to work up to a goal of 150 minutes of combined cardio and strengthening exercise per week for weight loss and overall health benefits. We discussed the following Behavioral Modification Strategies today: increasing lean protein intake, decreasing simple carbohydrates, increasing vegetables, increase H2O intake, decrease eating out, no skipping meals, work on meal planning and easy cooking plans, and emotional eating strategies.  Jasmine Contreras has agreed to follow up with our clinic in 2 weeks. She was informed of the importance of frequent follow up visits to maximize her success with intensive lifestyle modifications for her multiple health conditions.   OBESITY BEHAVIORAL INTERVENTION VISIT  Today's visit was # 2   Starting weight: 273 lbs Starting date: 03/28/18 Today's weight : Weight: 264 lb (119.7 kg)  Today's date: 04/11/2018 Total lbs lost to date: 9  ASK: We discussed the diagnosis of obesity with Jasmine Contreras today and Jasmine Contreras agreed to give [place] permission to discuss obesity behavioral modification therapy today.  ASSESS: Jasmine Contreras has the diagnosis of obesity and her BMI today is 40.15. Jasmine Contreras is in the action stage of change.   ADVISE: Jasmine Contreras was educated on the multiple health risks of obesity as well as the benefit of weight loss to improve her health. She was advised of the need for long term treatment and the importance of lifestyle modifications to improve her current health and to decrease her risk of future health problems.  AGREE: Multiple dietary modification options and treatment options were discussed and Jasmine Contreras agreed to follow the recommendations documented in the  above note.  ARRANGE: Jasmine Contreras was educated on the importance of frequent visits to treat obesity as outlined per CMS and USPSTF guidelines and agreed to schedule her next follow up appointment today.  I, Kirke Corin, am acting as Energy manager for [employer]. Manson Passey, DO  I have reviewed the above documentation for accuracy and completeness, and I agree with the above. -Corinna Capra, DO

## 2018-04-25 ENCOUNTER — Ambulatory Visit (INDEPENDENT_AMBULATORY_CARE_PROVIDER_SITE_OTHER): Payer: BLUE CROSS/BLUE SHIELD | Admitting: Bariatrics

## 2018-04-25 VITALS — BP 109/69 | HR 66 | Temp 97.8°F | Ht 68.0 in | Wt 266.0 lb

## 2018-04-25 DIAGNOSIS — R7303 Prediabetes: Secondary | ICD-10-CM | POA: Diagnosis not present

## 2018-04-25 DIAGNOSIS — F3289 Other specified depressive episodes: Secondary | ICD-10-CM | POA: Diagnosis not present

## 2018-04-25 DIAGNOSIS — Z6841 Body Mass Index (BMI) 40.0 and over, adult: Secondary | ICD-10-CM | POA: Diagnosis not present

## 2018-04-30 NOTE — Progress Notes (Signed)
Office: [phone]  /  Fax: [phone]   HPI:   Chief Complaint: OBESITY Jasmine Contreras is here to discuss her progress with her obesity treatment plan. She is on the  follow the Category 1 plan and is following her eating plan approximately 30 % of the time. She states she is exercising 0 minutes 0 times per week. Arnie is currently struggling with increased stress due to the anniversary of death of her mother. She states she "got a taste of sugar/salt" but is ready to get back on track.   Her weight is 266 lb (120.7 kg) today and has not lost weight since her last visit. She has lost 7 lbs since starting treatment with [place].  Pre-Diabetes Jasmine Contreras has a diagnosis of prediabetes based on her elevated HgA1c of 6.2 and fasting Insulin level of 7.4. She was informed this puts her at greater risk of developing diabetes. She is not taking metformin currently and continues to work on diet and exercise to decrease risk of diabetes. She denies nausea or hypoglycemia.  Depression with emotional eating behaviors Jasmine Contreras is struggling with emotional eating and using food for comfort to the extent that it is negatively impacting her health. She often snacks when she is not hungry. Jasmine Contreras sometimes feels she is out of control and then feels guilty that she made poor food choices. She has been working on behavior modification techniques to help reduce her emotional eating and has been somewhat successful. She shows no sign of suicidal or homicidal ideations.  Depression screen PHQ 2/9 03/28/2018  Decreased Interest 3  Down, Depressed, Hopeless 3  PHQ - 2 Score 6  Altered sleeping 3  Tired, decreased energy 3  Change in appetite 3  Feeling bad or failure about yourself  3  Trouble concentrating 3  Moving slowly or fidgety/restless 1  Suicidal thoughts 0  PHQ-9 Score 22  Difficult doing work/chores Somewhat difficult     ALLERGIES: Allergies  Allergen Reactions  . Peanuts [Peanut Oil]  Hives  . Abilify [Aripiprazole]     somnelence  . Antivert [Meclizine Hcl] Nausea Only  . Codeine Itching and Nausea And Vomiting  . Honey Bee Treatment [Bee Venom]   . Hydrocodone Itching and Nausea And Vomiting  . Loratadine   . Oxycodone Hcl Itching and Nausea And Vomiting  . Prednisone     Red face  . Scopace [Scopolamine] Nausea Only  . Sertraline     Suicidal thoughts  . Shellfish Allergy Itching  . Tavist-D [Albertsons Dayhist-D]     unknown  . Ultram [Tramadol] Nausea Only    MEDICATIONS: Current Outpatient Medications on File Prior to Visit  Medication Sig Dispense Refill  . BIOTIN FORTE PO Take 1 capsule by mouth every other day.     . Cholecalciferol (VITAMIN D-3 PO) Take 1,000 Int'l Units by mouth daily.    . colchicine 0.6 MG tablet Take 0.6 mg by mouth as needed. For gout flare ups    . EPINEPHrine 0.3 mg/0.3 mL IJ SOAJ injection Inject 0.3 mg into the muscle as needed.    . ferrous gluconate (IRON 27) 240 (27 FE) MG tablet Take 240 mg by mouth daily.    . hyoscyamine (LEVSIN SL) 0.125 MG SL tablet Place 0.125 mg under the tongue every 4 (four) hours as needed. Stomach spasms    . indomethacin (INDOCIN) 50 MG capsule Take 50 mg by mouth 2 (two) times daily with a meal.    . metoprolol succinate (TOPROL-XL) 25 MG  24 hr tablet TK 1 T PO QD  5  . nitroGLYCERIN (NITROSTAT) 0.4 MG SL tablet Place 0.4 mg under the tongue every 5 (five) minutes as needed for chest pain.    . pantoprazole (PROTONIX) 40 MG tablet Take 30- 60 min before your first and last meals of the day    . potassium chloride SA (K-DUR,KLOR-CON) 20 MEQ tablet Take 20 mEq by mouth 2 (two) times daily.     Marland Kitchen scopolamine (TRANSDERM-SCOP) 1 MG/3DAYS Place 1 patch (1.5 mg total) onto the skin every 3 (three) days. 10 patch 1  . torsemide (DEMADEX) 10 MG tablet Take 10 mg by mouth daily.    Marland Kitchen UNABLE TO FIND Med Name: CBL oil 2 drops at bedtime    . vitamin B-12 (CYANOCOBALAMIN) 1000 MCG tablet Take 1,000 mcg by  mouth daily.       No current facility-administered medications on file prior to visit.     PAST MEDICAL HISTORY: Past Medical History:  Diagnosis Date  . Acid reflux   . Anxiety   . Arthritis    trochanteric bursitis  . Back pain   . Chest pain   . Cough   . Depression   . Diarrhea   . Difficulty swallowing   . Dizziness   . Double vision   . Dry mouth   . Fatigue   . GERD (gastroesophageal reflux disease)   . Gout   . Headache   . Heart murmur   . Herpes   . Hiatal hernia   . Irritable bowel syndrome   . Itching   . Joint pain   . MVP (mitral valve prolapse)   . Neck stiffness   . Palpitations   . PUD (peptic ulcer disease)    s/p vagotomy  . Retaining fluid   . Ringing in ears   . Shortness of breath   . Stress   . Swelling of extremity   . Swelling of finger   . TIA (transient ischemic attack)   . Varicose veins   . Vertigo   . Vision changes   . Vitamin D deficiency   . Wheezing     PAST SURGICAL HISTORY: Past Surgical History:  Procedure Laterality Date  . BREAST EXCISIONAL BIOPSY Left   . CHOLECYSTECTOMY    . FOOT SURGERY    . partial gastrectomy with vagotomy    . right arthroscopic knee surgery    . trochanteric bursectomy    . VAGINAL HYSTERECTOMY     with oophorectomy    SOCIAL HISTORY: Social History   Tobacco Use  . Smoking status: Never Smoker  . Smokeless tobacco: Never Used  Substance Use Topics  . Alcohol use: Yes    Alcohol/week: 0.0 standard drinks    Comment: 1 glass of wine with dinner  . Drug use: No    FAMILY HISTORY: Family History  Problem Relation Age of Onset  . Diabetes Mother   . Hypertension Mother   . Kidney disease Mother   . Alcohol abuse Father   . Hyperlipidemia Father   . Hypertension Father   . Stroke Father   . Depression Father   . Sleep apnea Father     ROS: Review of Systems  Constitutional: Negative for weight loss.  Gastrointestinal: Negative for nausea.  Endo/Heme/Allergies:        Negative for hypoglycemia   Psychiatric/Behavioral: Positive for depression. Negative for suicidal ideas.       Negative for homicidal ideations  PHYSICAL EXAM: Blood pressure 109/69, pulse 66, temperature 97.8 F (36.6 C), temperature source Oral, height 5\' 8"  (1.727 m), weight 266 lb (120.7 kg), SpO2 96 %. Body mass index is 40.45 kg/m. Physical Exam  Constitutional: She is oriented to person, place, and time. She appears well-developed and well-nourished.  HENT:  Head: Normocephalic.  Neck: Normal range of motion.  Cardiovascular: Normal rate.  Pulmonary/Chest: Effort normal.  Musculoskeletal: Normal range of motion.  Neurological: She is alert and oriented to person, place, and time.  Skin: Skin is warm and dry.  Psychiatric: She has a normal mood and affect. Her behavior is normal.  Vitals reviewed.   RECENT LABS AND TESTS: BMET    Component Value Date/Time   NA 141 03/28/2018 1032   K 3.7 03/28/2018 1032   CL 100 03/28/2018 1032   CO2 27 03/28/2018 1032   GLUCOSE 87 03/28/2018 1032   GLUCOSE 171 (H) 08/06/2017 1834   BUN 9 03/28/2018 1032   CREATININE 0.70 03/28/2018 1032   CALCIUM 8.8 03/28/2018 1032   GFRNONAA 90 03/28/2018 1032   GFRAA 104 03/28/2018 1032   Lab Results  Component Value Date   HGBA1C 6.2 (H) 03/28/2018   HGBA1C 6.2 (H) 10/01/2015   Lab Results  Component Value Date   INSULIN 7.4 03/28/2018   CBC    Component Value Date/Time   WBC 9.1 01/16/2018 1652   RBC 4.69 01/16/2018 1652   HGB 12.3 01/16/2018 1652   HCT 37.6 01/16/2018 1652   PLT 275.0 01/16/2018 1652   MCV 80.1 01/16/2018 1652   MCH 26.4 08/06/2017 1834   MCHC 32.9 01/16/2018 1652   RDW 16.7 (H) 01/16/2018 1652   LYMPHSABS 2.1 01/16/2018 1652   MONOABS 0.6 01/16/2018 1652   EOSABS 0.0 01/16/2018 1652   BASOSABS 0.1 01/16/2018 1652   Iron/TIBC/Ferritin/ %Sat    Component Value Date/Time   IRON 45 10/01/2015 1003   TIBC 361 10/01/2015 1003   FERRITIN 43 10/01/2015  1003   IRONPCTSAT 12 (L) 10/01/2015 1003   Lipid Panel     Component Value Date/Time   CHOL 165 03/28/2018 1032   TRIG 77 03/28/2018 1032   HDL 64 03/28/2018 1032   LDLCALC 86 03/28/2018 1032   Hepatic Function Panel     Component Value Date/Time   PROT 6.9 03/28/2018 1032   ALBUMIN 4.2 03/28/2018 1032   AST 15 03/28/2018 1032   ALT 16 03/28/2018 1032   ALKPHOS 105 03/28/2018 1032   BILITOT 0.9 03/28/2018 1032   BILIDIR 0.1 08/09/2012 0845   IBILI 0.5 08/09/2012 0845      Component Value Date/Time   TSH 2.310 03/28/2018 1032   TSH 2.520 10/01/2015 1003    ASSESSMENT AND PLAN: Prediabetes  Other depression - with emotional eating  Class 3 severe obesity with serious comorbidity and body mass index (BMI) of 40.0 to 44.9 in adult, unspecified obesity type (HCC)  PLAN: Pre-Diabetes Jasmine Contreras will continue to work on weight loss, exercise, and decreasing simple carbohydrates in her diet to help decrease the risk of diabetes. We dicussed metformin including benefits and risks. She was informed that eating too many simple carbohydrates or too many calories at one sitting increases the likelihood of GI side effects. Jasmine Contreras declined metformin for now and a prescription was not written today. She agrees to continue to decrease carbohydrates and increase protein. Jasmine Contreras agreed to follow up with [place] as directed to monitor her progress.  Depression with Emotional Eating Behaviors We discussed behavior  modification techniques today to help Jasmine Contreras deal with her emotional eating and depression. She a referral to Dr. Dewaine Contreras but she declines at this time agreed to follow up as directed.  I spent > than 50% of the 15 minute visit on counseling as documented in the note.  Obesity Jasmine Contreras is currently in the action stage of change. As such, her goal is to continue with weight loss efforts She has agreed to follow the Category 2 plan Jasmine Contreras has been instructed to work up to a  goal of 150 minutes of combined cardio and strengthening exercise per week for weight loss and overall health benefits. We discussed the following Behavioral Modification Strategies today: increasing lean protein intake, decreasing simple carbohydrates , increasing vegetables, decrease eating out, increasing water intake, no skipping meals and work on meal planning and easy cooking plans and added structure.    Jasmine Contreras has agreed to follow up with our clinic in 2 weeks. She was informed of the importance of frequent follow up visits to maximize her success with intensive lifestyle modifications for her multiple health conditions.   OBESITY BEHAVIORAL INTERVENTION VISIT  Today's visit was # 3   Starting weight: 273 lb Starting date: 03/28/18 Today's weight : 266 lb Today's date: 04/25/18 Total lbs lost to date: 7 lb    ASK: We discussed the diagnosis of obesity with Jasmine Contreras today and Jasmine Contreras agreed to give [place] permission to discuss obesity behavioral modification therapy today.  ASSESS: Kazue has the diagnosis of obesity and her BMI today is 40.45 Brekyn is in the action stage of change   ADVISE: Jasmine Contreras was educated on the multiple health risks of obesity as well as the benefit of weight loss to improve her health. She was advised of the need for long term treatment and the importance of lifestyle modifications to improve her current health and to decrease her risk of future health problems.  AGREE: Multiple dietary modification options and treatment options were discussed and  Jasmine Contreras agreed to follow the recommendations documented in the above note.  ARRANGE: Jasmine Contreras was educated on the importance of frequent visits to treat obesity as outlined per CMS and USPSTF guidelines and agreed to schedule her next follow up appointment today.  Otis Peak, am acting as transcriptionist for [employer], DO   I have reviewed the above documentation for  accuracy and completeness, and I agree with the above. -Corinna Capra, DO

## 2018-05-08 ENCOUNTER — Encounter (INDEPENDENT_AMBULATORY_CARE_PROVIDER_SITE_OTHER): Payer: Self-pay

## 2018-05-08 ENCOUNTER — Ambulatory Visit (INDEPENDENT_AMBULATORY_CARE_PROVIDER_SITE_OTHER): Payer: BLUE CROSS/BLUE SHIELD | Admitting: Bariatrics

## 2018-05-15 ENCOUNTER — Ambulatory Visit (INDEPENDENT_AMBULATORY_CARE_PROVIDER_SITE_OTHER): Payer: BLUE CROSS/BLUE SHIELD | Admitting: Bariatrics

## 2018-05-15 ENCOUNTER — Encounter (INDEPENDENT_AMBULATORY_CARE_PROVIDER_SITE_OTHER): Payer: Self-pay | Admitting: Bariatrics

## 2018-05-15 VITALS — BP 117/69 | HR 76 | Temp 97.7°F | Ht 68.0 in | Wt 262.0 lb

## 2018-05-15 DIAGNOSIS — Z9189 Other specified personal risk factors, not elsewhere classified: Secondary | ICD-10-CM | POA: Diagnosis not present

## 2018-05-15 DIAGNOSIS — R7303 Prediabetes: Secondary | ICD-10-CM | POA: Diagnosis not present

## 2018-05-15 DIAGNOSIS — F3289 Other specified depressive episodes: Secondary | ICD-10-CM | POA: Diagnosis not present

## 2018-05-15 DIAGNOSIS — Z6839 Body mass index (BMI) 39.0-39.9, adult: Secondary | ICD-10-CM

## 2018-05-15 MED ORDER — METFORMIN HCL 500 MG PO TABS: 500.0000 mg | ORAL_TABLET | Freq: Every day | ORAL | 0 refills | Status: DC

## 2018-05-17 NOTE — Progress Notes (Signed)
Office: [phone]  /  Fax: [phone]   HPI:   Chief Complaint: OBESITY Jasmine Contreras is here to discuss her progress with her obesity treatment plan. She is on the Category 2 plan and is following her eating plan approximately 50 % of the time. She states she is exercising 0 minutes 0 times per week. [name] states that she has struggled with not having potatoes and rice. She has had some episodes of gout for which she was drinking "black cherry juice". She states that she "stays hungry".  Her weight is 262 lb (118.8 kg) today and has had a weight loss of 4 pounds over a period of 3 weeks since her last visit. She has lost 11 lbs since starting treatment with [place].  Pre-Diabetes Jasmine Contreras has a diagnosis of pre-diabetes based on her elevated Hgb A1c and was informed this puts her at greater risk of developing diabetes. She declined metformin at the last visit. Her last Hgb A1c was 6.2 and Insulin was 7.4 on 03/28/18. She continues to work on diet and exercise to decrease risk of diabetes.  At risk for diabetes Jasmine Contreras is at higher than average risk for developing diabetes due to her pre-diabetes and obesity. She currently denies polyuria or polydipsia.  Depression with emotional eating behaviors Jasmine Contreras is struggling with emotional eating and using food for comfort to the extent that it is negatively impacting her health. She often snacks when she is not hungry. Jasmine Contreras sometimes feels she is out of control and then feels guilty that she made poor food choices. Her PHQ-9 was 22 and she declined to see Dr. Dewaine Conger at her last visit. She is working on cognitive behavior therapy strategies and stress eating. She shows no sign of suicidal or homicidal ideations.  Depression screen PHQ 2/9 03/28/2018  Decreased Interest 3  Down, Depressed, Hopeless 3  PHQ - 2 Score 6  Altered sleeping 3  Tired, decreased energy 3  Change in appetite 3  Feeling bad or failure about yourself  3  Trouble  concentrating 3  Moving slowly or fidgety/restless 1  Suicidal thoughts 0  PHQ-9 Score 22  Difficult doing work/chores Somewhat difficult     ALLERGIES: Allergies  Allergen Reactions  . Peanuts [Peanut Oil] Hives  . Abilify [Aripiprazole]     somnelence  . Antivert [Meclizine Hcl] Nausea Only  . Codeine Itching and Nausea And Vomiting  . Honey Bee Treatment [Bee Venom]   . Hydrocodone Itching and Nausea And Vomiting  . Loratadine   . Oxycodone Hcl Itching and Nausea And Vomiting  . Prednisone     Red face  . Scopace [Scopolamine] Nausea Only  . Sertraline     Suicidal thoughts  . Shellfish Allergy Itching  . Tavist-D [[employer] Dayhist-D]     unknown  . Ultram [Tramadol] Nausea Only    MEDICATIONS: Current Outpatient Medications on File Prior to Visit  Medication Sig Dispense Refill  . BIOTIN FORTE PO Take 1 capsule by mouth every other day.     . Cholecalciferol (VITAMIN D-3 PO) Take 1,000 Int'l Units by mouth daily.    . colchicine 0.6 MG tablet Take 0.6 mg by mouth as needed. For gout flare ups    . EPINEPHrine 0.3 mg/0.3 mL IJ SOAJ injection Inject 0.3 mg into the muscle as needed.    . ferrous gluconate (IRON 27) 240 (27 FE) MG tablet Take 240 mg by mouth daily.    . hyoscyamine (LEVSIN SL) 0.125 MG SL tablet Place 0.125  mg under the tongue every 4 (four) hours as needed. Stomach spasms    . indomethacin (INDOCIN) 50 MG capsule Take 50 mg by mouth 2 (two) times daily with a meal.    . metoprolol succinate (TOPROL-XL) 25 MG 24 hr tablet TK 1 T PO QD  5  . nitroGLYCERIN (NITROSTAT) 0.4 MG SL tablet Place 0.4 mg under the tongue every 5 (five) minutes as needed for chest pain.    . pantoprazole (PROTONIX) 40 MG tablet Take 30- 60 min before your first and last meals of the day    . potassium chloride SA (K-DUR,KLOR-CON) 20 MEQ tablet Take 20 mEq by mouth 2 (two) times daily.     Marland Kitchen. scopolamine (TRANSDERM-SCOP) 1 MG/3DAYS Place 1 patch (1.5 mg total) onto the skin every  3 (three) days. 10 patch 1  . torsemide (DEMADEX) 10 MG tablet Take 10 mg by mouth daily.    Marland Kitchen. UNABLE TO FIND Med Name: CBL oil 2 drops at bedtime    . vitamin B-12 (CYANOCOBALAMIN) 1000 MCG tablet Take 1,000 mcg by mouth daily.       No current facility-administered medications on file prior to visit.     PAST MEDICAL HISTORY: Past Medical History:  Diagnosis Date  . Acid reflux   . Anxiety   . Arthritis    trochanteric bursitis  . Back pain   . Chest pain   . Cough   . Depression   . Diarrhea   . Difficulty swallowing   . Dizziness   . Double vision   . Dry mouth   . Fatigue   . GERD (gastroesophageal reflux disease)   . Gout   . Headache   . Heart murmur   . Herpes   . Hiatal hernia   . Irritable bowel syndrome   . Itching   . Joint pain   . MVP (mitral valve prolapse)   . Neck stiffness   . Palpitations   . PUD (peptic ulcer disease)    s/p vagotomy  . Retaining fluid   . Ringing in ears   . Shortness of breath   . Stress   . Swelling of extremity   . Swelling of finger   . TIA (transient ischemic attack)   . Varicose veins   . Vertigo   . Vision changes   . Vitamin D deficiency   . Wheezing     PAST SURGICAL HISTORY: Past Surgical History:  Procedure Laterality Date  . BREAST EXCISIONAL BIOPSY Left   . CHOLECYSTECTOMY    . FOOT SURGERY    . partial gastrectomy with vagotomy    . right arthroscopic knee surgery    . trochanteric bursectomy    . VAGINAL HYSTERECTOMY     with oophorectomy    SOCIAL HISTORY: Social History   Tobacco Use  . Smoking status: Never Smoker  . Smokeless tobacco: Never Used  Substance Use Topics  . Alcohol use: Yes    Alcohol/week: 0.0 standard drinks    Comment: 1 glass of wine with dinner  . Drug use: No    FAMILY HISTORY: Family History  Problem Relation Age of Onset  . Diabetes Mother   . Hypertension Mother   . Kidney disease Mother   . Alcohol abuse Father   . Hyperlipidemia Father   .  Hypertension Father   . Stroke Father   . Depression Father   . Sleep apnea Father     ROS: Review of Systems  Constitutional:  Positive for weight loss.  Genitourinary:       Negative for polyuria.  Endo/Heme/Allergies: Negative for polydipsia.  Psychiatric/Behavioral: Positive for depression. Negative for suicidal ideas.       Negative for homicidal ideations.    PHYSICAL EXAM: Blood pressure 117/69, pulse 76, temperature 97.7 F (36.5 C), temperature source Oral, height 5\' 8"  (1.727 m), weight 262 lb (118.8 kg), SpO2 99 %. Body mass index is 39.84 kg/m. Physical Exam  Constitutional: She is oriented to person, place, and time. She appears well-developed and well-nourished.  Cardiovascular: Normal rate.  Pulmonary/Chest: Effort normal.  Musculoskeletal: Normal range of motion.  Neurological: She is oriented to person, place, and time.  Skin: Skin is warm and dry.  Psychiatric: She has a normal mood and affect. Her behavior is normal.  Vitals reviewed.   RECENT LABS AND TESTS: BMET    Component Value Date/Time   NA 141 03/28/2018 1032   K 3.7 03/28/2018 1032   CL 100 03/28/2018 1032   CO2 27 03/28/2018 1032   GLUCOSE 87 03/28/2018 1032   GLUCOSE 171 (H) 08/06/2017 1834   BUN 9 03/28/2018 1032   CREATININE 0.70 03/28/2018 1032   CALCIUM 8.8 03/28/2018 1032   GFRNONAA 90 03/28/2018 1032   GFRAA 104 03/28/2018 1032   Lab Results  Component Value Date   HGBA1C 6.2 (H) 03/28/2018   HGBA1C 6.2 (H) 10/01/2015   Lab Results  Component Value Date   INSULIN 7.4 03/28/2018   CBC    Component Value Date/Time   WBC 9.1 01/16/2018 1652   RBC 4.69 01/16/2018 1652   HGB 12.3 01/16/2018 1652   HCT 37.6 01/16/2018 1652   PLT 275.0 01/16/2018 1652   MCV 80.1 01/16/2018 1652   MCH 26.4 08/06/2017 1834   MCHC 32.9 01/16/2018 1652   RDW 16.7 (H) 01/16/2018 1652   LYMPHSABS 2.1 01/16/2018 1652   MONOABS 0.6 01/16/2018 1652   EOSABS 0.0 01/16/2018 1652   BASOSABS 0.1  01/16/2018 1652   Iron/TIBC/Ferritin/ %Sat    Component Value Date/Time   IRON 45 10/01/2015 1003   TIBC 361 10/01/2015 1003   FERRITIN 43 10/01/2015 1003   IRONPCTSAT 12 (L) 10/01/2015 1003   Lipid Panel     Component Value Date/Time   CHOL 165 03/28/2018 1032   TRIG 77 03/28/2018 1032   HDL 64 03/28/2018 1032   LDLCALC 86 03/28/2018 1032   Hepatic Function Panel     Component Value Date/Time   PROT 6.9 03/28/2018 1032   ALBUMIN 4.2 03/28/2018 1032   AST 15 03/28/2018 1032   ALT 16 03/28/2018 1032   ALKPHOS 105 03/28/2018 1032   BILITOT 0.9 03/28/2018 1032   BILIDIR 0.1 08/09/2012 0845   IBILI 0.5 08/09/2012 0845      Component Value Date/Time   TSH 2.310 03/28/2018 1032   TSH 2.520 10/01/2015 1003   Results for SALISA, BROZ (MRN [id number]) as of 05/17/2018 11:50  Ref. Range 03/28/2018 10:32  Vitamin D, 25-Hydroxy Latest Ref Range: 30.0 - 100.0 ng/mL 51.3   ASSESSMENT AND PLAN: Prediabetes - Plan: metFORMIN (GLUCOPHAGE) 500 MG tablet  Other depression - with emotional eating  At risk for diabetes mellitus  Class 2 severe obesity with serious comorbidity and body mass index (BMI) of 39.0 to 39.9 in adult, unspecified obesity type (HCC)  PLAN:  Pre-Diabetes Zaida will continue to work on weight loss, exercise, and decreasing simple carbohydrates in her diet to help decrease the risk of diabetes. We discussed  metformin including benefits and risks. She was informed that eating too many simple carbohydrates or too many calories at one sitting increases the likelihood of GI side effects. Ramon agrees to begin metformin 500mg , 1 PO daily with breakfast #30 with no refills and a prescription was written today. Rudolph agreed to follow up with [place] as directed to monitor her progress in 2 weeks.  Diabetes risk counseling Hser was given extended (15 minutes) diabetes prevention counseling today. She is 67 y.o. female and has risk factors for diabetes  including pre-diabetes and obesity. We discussed intensive lifestyle modifications today with an emphasis on weight loss as well as increasing exercise and decreasing simple carbohydrates in her diet.  Depression with Emotional Eating Behaviors We discussed behavior modification techniques today to help Iyla deal with her emotional eating and depression. She has agreed to see Dr. Dewaine Conger, our bariatric psychologist for evaluation due to elevated PHQ-9 score and significant struggles with emotional eating.  Obesity Koral is currently in the action stage of change. As such, her goal is to continue with weight loss efforts. She has agreed to follow the Category 2 plan. She will start to take the "black cherry pills" instead of juice and she will factor in 70 calories for 4 ounces. Massiel has been instructed to work up to a goal of 150 minutes of combined cardio and strengthening exercise per week for weight loss and overall health benefits. We discussed the following Behavioral Modification Strategies today: increasing lean protein intake, decreasing simple carbohydrates, increasing vegetables, increase H2O intake, decrease eating out, and work on meal planning and easy cooking plans.  Adwoa has agreed to follow up with our clinic in 2 weeks. She was informed of the importance of frequent follow up visits to maximize her success with intensive lifestyle modifications for her multiple health conditions.   OBESITY BEHAVIORAL INTERVENTION VISIT  Today's visit was # 4   Starting weight: 273 lbs Starting date: 03/28/18 Today's weight : Weight: 262 lb (118.8 kg)  Today's date: 05/15/2018 Total lbs lost to date: 16  ASK: We discussed the diagnosis of obesity with Tennis Must Van[place] today and Karysa agreed to give [place] permission to discuss obesity behavioral modification therapy today.  ASSESS: Ronda has the diagnosis of obesity and her BMI today is 39.85. Shuntell is in the  action stage of change   ADVISE: Danissa was educated on the multiple health risks of obesity as well as the benefit of weight loss to improve her health. She was advised of the need for long term treatment and the importance of lifestyle modifications to improve her current health and to decrease her risk of future health problems.  AGREE: Multiple dietary modification options and treatment options were discussed and [place] agreed to follow the recommendations documented in the above note.  ARRANGE: Dorathea was educated on the importance of frequent visits to treat obesity as outlined per CMS and USPSTF guidelines and agreed to schedule her next follow up appointment today.  I, Kirke Corin, am acting as Energy manager for [employer].Manson Passey, DO  I have reviewed the above documentation for accuracy and completeness, and I agree with the above. -Corinna Capra, DO

## 2018-05-28 ENCOUNTER — Encounter (INDEPENDENT_AMBULATORY_CARE_PROVIDER_SITE_OTHER): Payer: Self-pay | Admitting: Bariatrics

## 2018-05-28 ENCOUNTER — Ambulatory Visit (INDEPENDENT_AMBULATORY_CARE_PROVIDER_SITE_OTHER): Payer: BLUE CROSS/BLUE SHIELD | Admitting: Psychology

## 2018-05-28 ENCOUNTER — Ambulatory Visit (INDEPENDENT_AMBULATORY_CARE_PROVIDER_SITE_OTHER): Payer: BLUE CROSS/BLUE SHIELD | Admitting: Bariatrics

## 2018-05-28 VITALS — BP 128/74 | HR 51 | Temp 97.8°F | Ht 68.0 in | Wt 264.0 lb

## 2018-05-28 DIAGNOSIS — R7303 Prediabetes: Secondary | ICD-10-CM | POA: Diagnosis not present

## 2018-05-28 DIAGNOSIS — F3289 Other specified depressive episodes: Secondary | ICD-10-CM | POA: Diagnosis not present

## 2018-05-28 DIAGNOSIS — E66813 Obesity, class 3: Secondary | ICD-10-CM

## 2018-05-28 DIAGNOSIS — Z6841 Body Mass Index (BMI) 40.0 and over, adult: Secondary | ICD-10-CM | POA: Diagnosis not present
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
## 2018-05-29 ENCOUNTER — Ambulatory Visit (INDEPENDENT_AMBULATORY_CARE_PROVIDER_SITE_OTHER): Payer: BLUE CROSS/BLUE SHIELD | Admitting: Psychology

## 2018-05-29 ENCOUNTER — Ambulatory Visit (INDEPENDENT_AMBULATORY_CARE_PROVIDER_SITE_OTHER): Payer: BLUE CROSS/BLUE SHIELD | Admitting: Bariatrics

## 2018-05-30 ENCOUNTER — Encounter (INDEPENDENT_AMBULATORY_CARE_PROVIDER_SITE_OTHER): Payer: Self-pay | Admitting: Bariatrics

## 2018-05-30 DIAGNOSIS — Z6841 Body Mass Index (BMI) 40.0 and over, adult: Secondary | ICD-10-CM

## 2018-05-30 NOTE — Progress Notes (Signed)
Office: [phone]  /  Fax: [phone]   HPI:   Chief Complaint: OBESITY Jasmine Contreras is here to discuss her progress with her obesity treatment plan. She is on the Category 2 plan and is following her eating plan approximately 0 % of the time. She states she is exercising 0 minutes 0 times per week. [name] has struggled due to eating out and other obligations.  Her weight is 264 lb (119.7 kg) today and has had a weight gain of 2 pounds over a period of 2 weeks since her last visit. She has lost 9 lbs since starting treatment with [place].  Pre-Diabetes Jasmine Contreras has a diagnosis of pre-diabetes based on her elevated Hgb A1c and was informed this puts her at greater risk of developing diabetes. Her last Hgb A1c was 6.2 and Insulin was 7.4 on 03/28/18. She has not started taking metformin that she was prescribed at her last visit. She continues to work on diet and exercise to decrease risk of diabetes.   Depression with emotional eating behaviors Jasmine Contreras is struggling with emotional eating and using food for comfort to the extent that it is negatively impacting her health. She often snacks when she is not hungry. Jasmine Contreras sometimes feels she is out of control and then feels guilty that she made poor food choices. She has been working on behavior modification techniques to help reduce her emotional eating and has been somewhat successful. Jasmine Contreras agreed to see Dr. Dewaine Conger at her last visit. She saw Dr. Dewaine Conger today at 10:00am. She shows no sign of suicidal or homicidal ideations.  ALLERGIES: Allergies  Allergen Reactions  . Peanuts [Peanut Oil] Hives  . Abilify [Aripiprazole]     somnelence  . Antivert [Meclizine Hcl] Nausea Only  . Codeine Itching and Nausea And Vomiting  . Honey Bee Treatment [Bee Venom]   . Hydrocodone Itching and Nausea And Vomiting  . Loratadine   . Oxycodone Hcl Itching and Nausea And Vomiting  . Prednisone     Red face  . Scopace [Scopolamine] Nausea Only  .  Sertraline     Suicidal thoughts  . Shellfish Allergy Itching  . Tavist-D [Albertsons Dayhist-D]     unknown  . Ultram [Tramadol] Nausea Only    MEDICATIONS: Current Outpatient Medications on File Prior to Visit  Medication Sig Dispense Refill  . BIOTIN FORTE PO Take 1 capsule by mouth every other day.     . Cholecalciferol (VITAMIN D-3 PO) Take 1,000 Int'l Units by mouth daily.    . colchicine 0.6 MG tablet Take 0.6 mg by mouth as needed. For gout flare ups    . EPINEPHrine 0.3 mg/0.3 mL IJ SOAJ injection Inject 0.3 mg into the muscle as needed.    . ferrous gluconate (IRON 27) 240 (27 FE) MG tablet Take 240 mg by mouth daily.    . hyoscyamine (LEVSIN SL) 0.125 MG SL tablet Place 0.125 mg under the tongue every 4 (four) hours as needed. Stomach spasms    . indomethacin (INDOCIN) 50 MG capsule Take 50 mg by mouth 2 (two) times daily with a meal.    . metFORMIN (GLUCOPHAGE) 500 MG tablet Take 1 tablet (500 mg total) by mouth daily with breakfast. 30 tablet 0  . metoprolol succinate (TOPROL-XL) 25 MG 24 hr tablet TK 1 T PO QD  5  . nitroGLYCERIN (NITROSTAT) 0.4 MG SL tablet Place 0.4 mg under the tongue every 5 (five) minutes as needed for chest pain.    . pantoprazole (PROTONIX) 40  MG tablet Take 30- 60 min before your first and last meals of the day    . potassium chloride SA (K-DUR,KLOR-CON) 20 MEQ tablet Take 20 mEq by mouth 2 (two) times daily.     Marland Kitchen. scopolamine (TRANSDERM-SCOP) 1 MG/3DAYS Place 1 patch (1.5 mg total) onto the skin every 3 (three) days. 10 patch 1  . torsemide (DEMADEX) 10 MG tablet Take 10 mg by mouth daily.    Marland Kitchen. UNABLE TO FIND Med Name: CBL oil 2 drops at bedtime    . vitamin B-12 (CYANOCOBALAMIN) 1000 MCG tablet Take 1,000 mcg by mouth daily.       No current facility-administered medications on file prior to visit.     PAST MEDICAL HISTORY: Past Medical History:  Diagnosis Date  . Acid reflux   . Anxiety   . Arthritis    trochanteric bursitis  . Back pain    . Chest pain   . Cough   . Depression   . Diarrhea   . Difficulty swallowing   . Dizziness   . Double vision   . Dry mouth   . Fatigue   . GERD (gastroesophageal reflux disease)   . Gout   . Headache   . Heart murmur   . Herpes   . Hiatal hernia   . Irritable bowel syndrome   . Itching   . Joint pain   . MVP (mitral valve prolapse)   . Neck stiffness   . Palpitations   . PUD (peptic ulcer disease)    s/p vagotomy  . Retaining fluid   . Ringing in ears   . Shortness of breath   . Stress   . Swelling of extremity   . Swelling of finger   . TIA (transient ischemic attack)   . Varicose veins   . Vertigo   . Vision changes   . Vitamin D deficiency   . Wheezing     PAST SURGICAL HISTORY: Past Surgical History:  Procedure Laterality Date  . BREAST EXCISIONAL BIOPSY Left   . CHOLECYSTECTOMY    . FOOT SURGERY    . partial gastrectomy with vagotomy    . right arthroscopic knee surgery    . trochanteric bursectomy    . VAGINAL HYSTERECTOMY     with oophorectomy    SOCIAL HISTORY: Social History   Tobacco Use  . Smoking status: Never Smoker  . Smokeless tobacco: Never Used  Substance Use Topics  . Alcohol use: Yes    Alcohol/week: 0.0 standard drinks    Comment: 1 glass of wine with dinner  . Drug use: No    FAMILY HISTORY: Family History  Problem Relation Age of Onset  . Diabetes Mother   . Hypertension Mother   . Kidney disease Mother   . Alcohol abuse Father   . Hyperlipidemia Father   . Hypertension Father   . Stroke Father   . Depression Father   . Sleep apnea Father     ROS: Review of Systems  Constitutional: Negative for weight loss.  Psychiatric/Behavioral: Positive for depression. Negative for suicidal ideas.       Negative for homicidal ideations.    PHYSICAL EXAM: Blood pressure 128/74, pulse (!) 51, temperature 97.8 F (36.6 C), temperature source Oral, height 5\' 8"  (1.727 m), weight 264 lb (119.7 kg), SpO2 98 %. Body mass  index is 40.14 kg/m. Physical Exam  Constitutional: She is oriented to person, place, and time. She appears well-developed and well-nourished.  Cardiovascular: Normal rate.  Pulmonary/Chest: Effort normal.  Musculoskeletal: Normal range of motion.  Neurological: She is oriented to person, place, and time.  Skin: Skin is warm and dry.  Psychiatric: She has a normal mood and affect. Her behavior is normal.  Vitals reviewed.   RECENT LABS AND TESTS: BMET    Component Value Date/Time   NA 141 03/28/2018 1032   K 3.7 03/28/2018 1032   CL 100 03/28/2018 1032   CO2 27 03/28/2018 1032   GLUCOSE 87 03/28/2018 1032   GLUCOSE 171 (H) 08/06/2017 1834   BUN 9 03/28/2018 1032   CREATININE 0.70 03/28/2018 1032   CALCIUM 8.8 03/28/2018 1032   GFRNONAA 90 03/28/2018 1032   GFRAA 104 03/28/2018 1032   Lab Results  Component Value Date   HGBA1C 6.2 (H) 03/28/2018   HGBA1C 6.2 (H) 10/01/2015   Lab Results  Component Value Date   INSULIN 7.4 03/28/2018   CBC    Component Value Date/Time   WBC 9.1 01/16/2018 1652   RBC 4.69 01/16/2018 1652   HGB 12.3 01/16/2018 1652   HCT 37.6 01/16/2018 1652   PLT 275.0 01/16/2018 1652   MCV 80.1 01/16/2018 1652   MCH 26.4 08/06/2017 1834   MCHC 32.9 01/16/2018 1652   RDW 16.7 (H) 01/16/2018 1652   LYMPHSABS 2.1 01/16/2018 1652   MONOABS 0.6 01/16/2018 1652   EOSABS 0.0 01/16/2018 1652   BASOSABS 0.1 01/16/2018 1652   Iron/TIBC/Ferritin/ %Sat    Component Value Date/Time   IRON 45 10/01/2015 1003   TIBC 361 10/01/2015 1003   FERRITIN 43 10/01/2015 1003   IRONPCTSAT 12 (L) 10/01/2015 1003   Lipid Panel     Component Value Date/Time   CHOL 165 03/28/2018 1032   TRIG 77 03/28/2018 1032   HDL 64 03/28/2018 1032   LDLCALC 86 03/28/2018 1032   Hepatic Function Panel     Component Value Date/Time   PROT 6.9 03/28/2018 1032   ALBUMIN 4.2 03/28/2018 1032   AST 15 03/28/2018 1032   ALT 16 03/28/2018 1032   ALKPHOS 105 03/28/2018 1032    BILITOT 0.9 03/28/2018 1032   BILIDIR 0.1 08/09/2012 0845   IBILI 0.5 08/09/2012 0845      Component Value Date/Time   TSH 2.310 03/28/2018 1032   TSH 2.520 10/01/2015 1003   Results for ALITA, WALDREN (MRN [id number]) as of 05/30/2018 14:49  Ref. Range 03/28/2018 10:32  Vitamin D, 25-Hydroxy Latest Ref Range: 30.0 - 100.0 ng/mL 51.3   ASSESSMENT AND PLAN: Prediabetes  Other depression - with emotional eating  Class 3 severe obesity with serious comorbidity and body mass index (BMI) of 40.0 to 44.9 in adult, unspecified obesity type (HCC)  PLAN:  Pre-Diabetes Maja will continue to work on weight loss, exercise, and decreasing simple carbohydrates in her diet to help decrease the risk of diabetes. We discussed metformin including benefits and risks. She was informed that eating too many simple carbohydrates or too many calories at one sitting increases the likelihood of GI side effects. Dessire agreed to start metformin in about 1 week after her move. Jazline agreed to follow up with [place] as directed to monitor her progress in 2 weeks.  Depression with Emotional Eating Behaviors We discussed behavior modification techniques today to help Dovie deal with her emotional eating and depression. She has agreed to continue to follow up with Dr.Barker and agreed to follow up with [place] as directed in 2 weeks.   I spent > than 50% of the 15 minute  visit on counseling as documented in the note.  Obesity Rebekha is currently in the action stage of change. As such, her goal is to continue with weight loss efforts. She has agreed to follow the Category 2 plan. She was given "Thanksgiving", Fruit Sheet" and "On the Road"  information handouts. Sylvia has been instructed to work up to a goal of 150 minutes of combined cardio and strengthening exercise per week for weight loss and overall health benefits. We discussed the following Behavioral Modification Strategies today:  increasing lean protein intake, decreasing simple carbohydrates, increasing vegetables, increase H2O intake, decrease eating out, no skipping meals, work on meal planning and easy cooking plans, better snacking choices, and emotional eating strategies.  Hendrix has agreed to follow up with our clinic in 2 weeks. She was informed of the importance of frequent follow up visits to maximize her success with intensive lifestyle modifications for her multiple health conditions.   OBESITY BEHAVIORAL INTERVENTION VISIT  Today's visit was # 2   Starting weight: 273 lbs Starting date: 03/28/18 Today's weight : Weight: 264 lb (119.7 kg)  Today's date: 05/28/2018 Total lbs lost to date: 9  ASK: We discussed the diagnosis of obesity with Tennis Must Nahm today and Towanna agreed to give [place] permission to discuss obesity behavioral modification therapy today.  ASSESS: Samariyah has the diagnosis of obesity and her BMI today is 40.15. Colene is in the action stage of change.   ADVISE: Lynnleigh was educated on the multiple health risks of obesity as well as the benefit of weight loss to improve her health. She was advised of the need for long term treatment and the importance of lifestyle modifications to improve her current health and to decrease her risk of future health problems.  AGREE: Multiple dietary modification options and treatment options were discussed and Lakeyia agreed to follow the recommendations documented in the above note.  ARRANGE: Adonica was educated on the importance of frequent visits to treat obesity as outlined per CMS and USPSTF guidelines and agreed to schedule her next follow up appointment today.  I, Kirke Corin, am acting as Energy manager for [employer]. Manson Passey, DO  I have reviewed the above documentation for accuracy and completeness, and I agree with the above. -Corinna Capra, DO

## 2018-06-19 ENCOUNTER — Encounter (INDEPENDENT_AMBULATORY_CARE_PROVIDER_SITE_OTHER): Payer: Self-pay | Admitting: Bariatrics

## 2018-06-19 ENCOUNTER — Ambulatory Visit (INDEPENDENT_AMBULATORY_CARE_PROVIDER_SITE_OTHER): Payer: BLUE CROSS/BLUE SHIELD | Admitting: Psychology

## 2018-06-19 ENCOUNTER — Ambulatory Visit (INDEPENDENT_AMBULATORY_CARE_PROVIDER_SITE_OTHER): Payer: BLUE CROSS/BLUE SHIELD | Admitting: Bariatrics

## 2018-06-19 VITALS — BP 101/65 | HR 66 | Temp 97.7°F | Ht 68.0 in | Wt 257.0 lb

## 2018-06-19 DIAGNOSIS — R7303 Prediabetes: Secondary | ICD-10-CM

## 2018-06-19 DIAGNOSIS — Z6839 Body mass index (BMI) 39.0-39.9, adult: Secondary | ICD-10-CM | POA: Diagnosis not present

## 2018-06-19 DIAGNOSIS — F3289 Other specified depressive episodes: Secondary | ICD-10-CM | POA: Diagnosis not present

## 2018-06-19 NOTE — Progress Notes (Signed)
Office: [phone]  /  Fax: [phone]   HPI:   Chief Complaint: OBESITY Jasmine Contreras is here to discuss her progress with her obesity treatment plan. She is on the Category 2 plan and is following her eating plan approximately 30 % of the time. She states she is exercising 0 minutes 0 times per week. Jasmine Contreras is doing well with the Category 2 plan. She is in the process of moving, and has had some difficulty keeping all the "right food on hand".. Her weight is 254 lb (115.2 kg) today and has had a weight loss of 7 pounds over a period of 3 weeks since her last visit. She has lost 16 lbs since starting treatment with [place].  Pre-Diabetes Jasmine Contreras has a diagnosis of prediabetes based on her elevated Hgb A1c of 6.2 and insulin level of 7.4 and was informed this puts her at greater risk of developing diabetes. She is not taking metformin currently and continues to work on diet and exercise to decrease risk of diabetes. She denies nausea or hypoglycemia.  Depression with emotional eating behaviors Jasmine Contreras is seeing Dr. Dewaine Conger our bariatric psychologist. She struggles with emotional eating and using food for comfort to the extent that it is negatively impacting her health. She often snacks when she is not hungry. Jasmine Contreras sometimes feels she is out of control and then feels guilty that she made poor food choices. She has been working on behavior modification techniques to help reduce her emotional eating and has been somewhat successful. She shows no sign of suicidal or homicidal ideations.  Depression screen [facility] 2/9 06/19/2018 05/28/2018 03/28/2018  Decreased Interest 0 0 3  Down, Depressed, Hopeless 1 1 3   PHQ - 2 Score 1 1 6   Altered sleeping 3 3 3   Tired, decreased energy 3 1 3   Change in appetite 1 0 3  Feeling bad or failure about yourself  1 1 3   Trouble concentrating 3 1 3   Moving slowly or fidgety/restless 0 0 1  Suicidal thoughts 0 0 0  PHQ-9 Score 12 7 22   Difficult doing  work/chores - - Somewhat difficult    ASSESSMENT AND PLAN:  Prediabetes  Other depression - with emotional eating  Class 2 severe obesity with serious comorbidity and body mass index (BMI) of 39.0 to 39.9 in adult, unspecified obesity type (HCC)  PLAN:  Pre-Diabetes Jasmine Contreras will continue to work on weight loss, exercise, and decreasing simple carbohydrates in her diet to help decrease the risk of diabetes. We dicussed metformin including benefits and risks. She was informed that eating too many simple carbohydrates or too many calories at one sitting increases the likelihood of GI side effects. Jasmine Contreras will begin metformin in the future and she will have labs done per her PCP. Jasmine Contreras agreed to follow up with [place] as directed to monitor her progress.  Depression with Emotional Eating Behaviors We discussed behavior modification techniques today to help Baley deal with her emotional eating and depression. She will follow up with our clinic in 3 weeks.  I spent > than 50% of the 15 minute visit on counseling as documented in the note.  Obesity Jasmine Contreras is currently in the action stage of change. As such, her goal is to continue with weight loss efforts She has agreed to follow the Category 2 plan Jasmine Contreras has been instructed to work up to a goal of 150 minutes of combined cardio and strengthening exercise per week for weight loss and overall health benefits. We discussed the following  Behavioral Modification Strategies today: increase H2O intake, no skipping meals, increasing lean protein intake, decreasing simple carbohydrates, increasing vegetables and work on meal planning and easy cooking plans 6 minutes was spent on today's visit, replacing handouts "diet plan, fruits and eating out" as Stachia has lost them.  Jasmine Contreras has agreed to follow up with our clinic in 3 weeks. She was informed of the importance of frequent follow up visits to maximize her success with intensive  lifestyle modifications for her multiple health conditions.  ALLERGIES: Allergies  Allergen Reactions  . Peanuts [Peanut Oil] Hives  . Abilify [Aripiprazole]     somnelence  . Antivert [Meclizine Hcl] Nausea Only  . Codeine Itching and Nausea And Vomiting  . Honey Bee Treatment [Bee Venom]   . Hydrocodone Itching and Nausea And Vomiting  . Loratadine   . Oxycodone Hcl Itching and Nausea And Vomiting  . Prednisone     Red face  . Scopace [Scopolamine] Nausea Only  . Sertraline     Suicidal thoughts  . Shellfish Allergy Itching  . Tavist-D [Albertsons Dayhist-D]     unknown  . Ultram [Tramadol] Nausea Only    MEDICATIONS: Current Outpatient Medications on File Prior to Visit  Medication Sig Dispense Refill  . BIOTIN FORTE PO Take 1 capsule by mouth every other day.     . Cholecalciferol (VITAMIN D-3 PO) Take 1,000 Int'l Units by mouth daily.    . colchicine 0.6 MG tablet Take 0.6 mg by mouth as needed. For gout flare ups    . EPINEPHrine 0.3 mg/0.3 mL IJ SOAJ injection Inject 0.3 mg into the muscle as needed.    . ferrous gluconate (IRON 27) 240 (27 FE) MG tablet Take 240 mg by mouth daily.    . hyoscyamine (LEVSIN SL) 0.125 MG SL tablet Place 0.125 mg under the tongue every 4 (four) hours as needed. Stomach spasms    . indomethacin (INDOCIN) 50 MG capsule Take 50 mg by mouth 2 (two) times daily with a meal.    . metFORMIN (GLUCOPHAGE) 500 MG tablet Take 1 tablet (500 mg total) by mouth daily with breakfast. 30 tablet 0  . metoprolol succinate (TOPROL-XL) 25 MG 24 hr tablet TK 1 T PO QD  5  . nitroGLYCERIN (NITROSTAT) 0.4 MG SL tablet Place 0.4 mg under the tongue every 5 (five) minutes as needed for chest pain.    . pantoprazole (PROTONIX) 40 MG tablet Take 30- 60 min before your first and last meals of the day    . potassium chloride SA (K-DUR,KLOR-CON) 20 MEQ tablet Take 20 mEq by mouth 2 (two) times daily.     Marland Kitchen scopolamine (TRANSDERM-SCOP) 1 MG/3DAYS Place 1 patch (1.5 mg  total) onto the skin every 3 (three) days. 10 patch 1  . torsemide (DEMADEX) 10 MG tablet Take 10 mg by mouth daily.    Marland Kitchen UNABLE TO FIND Med Name: CBL oil 2 drops at bedtime    . vitamin B-12 (CYANOCOBALAMIN) 1000 MCG tablet Take 1,000 mcg by mouth daily.       No current facility-administered medications on file prior to visit.     PAST MEDICAL HISTORY: Past Medical History:  Diagnosis Date  . Acid reflux   . Anxiety   . Arthritis    trochanteric bursitis  . Back pain   . Chest pain   . Cough   . Depression   . Diarrhea   . Difficulty swallowing   . Dizziness   . Double vision   .  Dry mouth   . Fatigue   . GERD (gastroesophageal reflux disease)   . Gout   . Headache   . Heart murmur   . Herpes   . Hiatal hernia   . Irritable bowel syndrome   . Itching   . Joint pain   . MVP (mitral valve prolapse)   . Neck stiffness   . Palpitations   . PUD (peptic ulcer disease)    s/p vagotomy  . Retaining fluid   . Ringing in ears   . Shortness of breath   . Stress   . Swelling of extremity   . Swelling of finger   . TIA (transient ischemic attack)   . Varicose veins   . Vertigo   . Vision changes   . Vitamin D deficiency   . Wheezing     PAST SURGICAL HISTORY: Past Surgical History:  Procedure Laterality Date  . BREAST EXCISIONAL BIOPSY Left   . CHOLECYSTECTOMY    . FOOT SURGERY    . partial gastrectomy with vagotomy    . right arthroscopic knee surgery    . trochanteric bursectomy    . VAGINAL HYSTERECTOMY     with oophorectomy    SOCIAL HISTORY: Social History   Tobacco Use  . Smoking status: Never Smoker  . Smokeless tobacco: Never Used  Substance Use Topics  . Alcohol use: Yes    Alcohol/week: 0.0 standard drinks    Comment: 1 glass of wine with dinner  . Drug use: No    FAMILY HISTORY: Family History  Problem Relation Age of Onset  . Diabetes Mother   . Hypertension Mother   . Kidney disease Mother   . Alcohol abuse Father   .  Hyperlipidemia Father   . Hypertension Father   . Stroke Father   . Depression Father   . Sleep apnea Father     ROS: Review of Systems  Constitutional: Positive for weight loss.  Gastrointestinal: Negative for nausea.  Endo/Heme/Allergies:       Negative for hypoglycemia  Psychiatric/Behavioral: Positive for depression. Negative for suicidal ideas.    PHYSICAL EXAM: Blood pressure 101/65, pulse 66, temperature 97.7 F (36.5 C), temperature source Oral, height 5\' 8"  (1.727 m), weight 254 lb (115.2 kg), SpO2 98 %. Body mass index is 38.62 kg/m. Physical Exam Vitals signs reviewed.  Constitutional:      Appearance: Normal appearance. She is well-developed. She is obese.  Cardiovascular:     Rate and Rhythm: Normal rate.  Pulmonary:     Effort: Pulmonary effort is normal.  Musculoskeletal: Normal range of motion.  Skin:    General: Skin is warm and dry.  Neurological:     Mental Status: She is alert and oriented to person, place, and time.  Psychiatric:        Mood and Affect: Mood normal.        Behavior: Behavior normal.        Thought Content: Thought content does not include homicidal or suicidal ideation.     RECENT LABS AND TESTS: BMET    Component Value Date/Time   NA 141 03/28/2018 1032   K 3.7 03/28/2018 1032   CL 100 03/28/2018 1032   CO2 27 03/28/2018 1032   GLUCOSE 87 03/28/2018 1032   GLUCOSE 171 (H) 08/06/2017 1834   BUN 9 03/28/2018 1032   CREATININE 0.70 03/28/2018 1032   CALCIUM 8.8 03/28/2018 1032   GFRNONAA 90 03/28/2018 1032   GFRAA 104 03/28/2018 1032  Lab Results  Component Value Date   HGBA1C 6.2 (H) 03/28/2018   HGBA1C 6.2 (H) 10/01/2015   Lab Results  Component Value Date   INSULIN 7.4 03/28/2018   CBC    Component Value Date/Time   WBC 9.1 01/16/2018 1652   RBC 4.69 01/16/2018 1652   HGB 12.3 01/16/2018 1652   HCT 37.6 01/16/2018 1652   PLT 275.0 01/16/2018 1652   MCV 80.1 01/16/2018 1652   MCH 26.4 08/06/2017 1834    MCHC 32.9 01/16/2018 1652   RDW 16.7 (H) 01/16/2018 1652   LYMPHSABS 2.1 01/16/2018 1652   MONOABS 0.6 01/16/2018 1652   EOSABS 0.0 01/16/2018 1652   BASOSABS 0.1 01/16/2018 1652   Iron/TIBC/Ferritin/ %Sat    Component Value Date/Time   IRON 45 10/01/2015 1003   TIBC 361 10/01/2015 1003   FERRITIN 43 10/01/2015 1003   IRONPCTSAT 12 (L) 10/01/2015 1003   Lipid Panel     Component Value Date/Time   CHOL 165 03/28/2018 1032   TRIG 77 03/28/2018 1032   HDL 64 03/28/2018 1032   LDLCALC 86 03/28/2018 1032   Hepatic Function Panel     Component Value Date/Time   PROT 6.9 03/28/2018 1032   ALBUMIN 4.2 03/28/2018 1032   AST 15 03/28/2018 1032   ALT 16 03/28/2018 1032   ALKPHOS 105 03/28/2018 1032   BILITOT 0.9 03/28/2018 1032   BILIDIR 0.1 08/09/2012 0845   IBILI 0.5 08/09/2012 0845      Component Value Date/Time   TSH 2.310 03/28/2018 1032   TSH 2.520 10/01/2015 1003     Ref. Range 03/28/2018 10:32  Vitamin D, 25-Hydroxy Latest Ref Range: 30.0 - 100.0 ng/mL 51.3    OBESITY BEHAVIORAL INTERVENTION VISIT  Today's visit was # 6   Starting weight: 273 lbs Starting date: 03/28/2018 Today's weight : 257 lbs  Today's date: 06/19/2018 Total lbs lost to date: 16   ASK: We discussed the diagnosis of obesity with Jasmine Contreras today and Jasmine Contreras agreed to give us permission to discuss obesity behavioral modification therapy today.  ASSESS: Jasmine Contreras has the diagnosis of obesity and her BMI today is 39.09 Jasmine Contreras is in the action stage of change   ADVISE: Jasmine Contreras was educated on the multiple health risks of obesity as well as the benefit of weight loss to improve her health. She was advised of the need for long term treatment and the importance of lifestyle modifications to improve her current health and to decrease her risk of future health problems.  AGREE: Multiple dietary modification options and treatment options were discussed and  Jasmine Contreras agreed to  follow the recommendations documented in the above note.  ARRANGE: Jasmine Contreras was educated on the importance of frequent visits to treat obesity as outlined per CMS and USPSTF guidelines and agreed to schedule her next follow up appointment today.  Cristi LoronI, Joanne Murray, am acting as Energy managertranscriptionist for [employer]. Manson Passey, DO  I have reviewed the above documentation for accuracy and completeness, and I agree with the above. -Corinna CapraAngel , DO

## 2018-06-19 NOTE — Progress Notes (Signed)
Office: [phone]  /  Fax: [phone]   Date: June 19, 2018  Time Seen: 9:33am Duration: 28 minutes Provider: Lawerance Cruel, Psy.D. Type of Session: Individual Therapy   HPI: Jasmine Contreras was referred by Dr. Corinna Contreras due to depression with emotional eating behaviors. Per the note for the visit with Dr. Corinna Contreras on May 15, 2018, "Jasmine Contreras struggling with emotional eating and using food for comfort to the extent that it is negatively impacting herhealth. Sheoften snacks when sheis not hungry. Elizabethsometimes feels sheis out of control and then feels guilty that shemade poor food choices. Her PHQ-9 was 22 and she declined to see Dr. Dewaine Contreras at her last visit. She is working on cognitive behavior therapy strategies and stress eating. She shows no sign of suicidal or homicidal ideations." However, at the time of her appointment on May 15, 2018, Jasmine Contreras agreed to meet with this provider. In addition, per the note for the initial visit with Dr. Corinna Contreras on March 28, 2018, Jasmine Contreras's heaviest weight ever was 280 pounds. During the initial appointment with Dr. Manson Contreras, Jasmine Contreras reported experiencing the following: significant food craving issues; snacking frequently in the evenings; frequently drinking liquids with calories; frequently making poor food choices; frequently eats larger portions than normal; binge eating behaviors; and struggling with emotional eating.   During the initial appointment with this provider, Jasmine Contreras reported experiencing "consistent weight gain and not being able to lose weight."  She also noted she engages in "stress eating." Jasmine Contreras noted her last episode of emotional eating was in the past two weeks secondary to moving. She explained, "I am eating and packing." She also noted overeating, which last occurred this past Saturday. In addition to overeating, Jasmine Contreras described grazing throughout the day. She noted she tends to crave salty  foods, such as potato chips. Moreover, Jasmine Contreras indicated she began engaging in emotional eating and overeating approximately 11 years ago secondary to her husband's infidelity. Emotional eating was reportedly exacerbated approximately two years ago after [place] mother passed away. Jasmine Contreras denied a history of binge eating, and she also denied a history of purging and engagement other compensatory strategies. She has never been diagnosed with an eating disorder. Furthermore, Jasmine Contreras was asked to complete a questionnaire assessing various behaviors related to emotional eating. Jasmine Contreras endorsed the following: overeat when you are celebrating, experience food cravings on a regular basis, eat certain foods when you are anxious, stressed, depressed, or your feelings are hurt, use food to help you cope with emotional situations, find food is comforting to you, overeat when you are angry or upset, overeat when you are worried about something, overeat frequently when you are bored or lonely, not worry about what you eat when you are in a good mood, overeat when you are alone, but eat much less when you are with other people and eat as a reward.   Session Content: Session focused on the following treatment goal: decrease emotional eating. The session was initiated with the administration of the PHQ-9 and GAD-7, as well as a brief check-in. Jasmine Contreras reported experiencing worry regarding her living situation, and noted she is currently residing with her sister. As such, she described experiencing difficulty eating the prescribed foods on her meal plan and noted her eating patterns are "irregular." This was explored further. Jasmine Contreras indicated she is consistently eating the breakfast portion of the meal plan. She acknowledged she is not always consuming all allocated snack calories; therefore, this provider and Jasmine Contreras established a goal of eating all snack calories between  now and the next appointment. She was  encouraged to discuss the other meals with Dr. Manson Contreras; Jasmine Contreras agreed. Regarding emotional eating, Jasmine Contreras indicated at times she continues to overeat, but she described going long periods without eating anything. Psychoeducation regarding triggers for emotional eating was provided. Jasmine Contreras was provided a handout, and encouraged to utilize the handout between now and the next appointment to increase awareness of triggers and frequency. Jasmine Contreras agreed. Jasmine Contreras was receptive to today's session as evidenced by openness to sharing, responsiveness to feedback, and identifying her triggers for emotional eating.  Mental Status Examination: Jasmine Contreras arrived on time for the appointment; however, the appointment was initiated late due to this provider. She presented as appropriately dressed and groomed. Jasmine Contreras appeared her stated age and demonstrated adequate orientation to time, place, person, and purpose of the appointment. She also demonstrated appropriate eye contact. No psychomotor abnormalities or behavioral peculiarities noted. Her mood was euthymic with congruent affect. Her thought processes were logical, linear, and goal-directed. No hallucinations, delusions, bizarre thinking or behavior reported or observed. Judgment, insight, and impulse control appeared to be grossly intact. There was no evidence of paraphasias (i.e., errors in speech, gross mispronunciations, and word substitutions), repetition deficits, or disturbances in volume or prosody (i.e., rhythm and intonation). There was no evidence of attention or memory impairments. Jasmine Contreras denied current suicidal and homicidal ideation, intent or plan.  Structured Assessment Results: The Patient Health Questionnaire-9 (PHQ-9) is a self-report measure that assesses symptoms and severity of depression over the course of the last two weeks. Jasmine Contreras obtained a score of 12 suggesting moderate depression. Jasmine Contreras finds the endorsed symptoms to be  not difficult at all. Depression screen PHQ 2/9 06/19/2018  Decreased Interest 0  Down, Depressed, Hopeless 1  PHQ - 2 Score 1  Altered sleeping 3  Tired, decreased energy 3  Change in appetite 1  Feeling bad or failure about yourself  1  Trouble concentrating 3  Moving slowly or fidgety/restless 0  Suicidal thoughts 0  PHQ-9 Score 12  Difficult doing work/chores -   The Generalized Anxiety Disorder-7 (GAD-7) is a brief self-report measure that assesses symptoms of anxiety over the course of the last two weeks. Jasmine Contreras obtained a score of 6 suggesting mild anxiety. GAD 7 : Generalized Anxiety Score 06/19/2018  Nervous, Anxious, on Edge 1  Control/stop worrying 1  Worry too much - different things 0  Trouble relaxing 0  Restless 0  Easily annoyed or irritable 1  Afraid - awful might happen 3  Total GAD 7 Score 6  Anxiety Difficulty Not difficult at all   Interventions: Jasmine Contreras was administered the PHQ-9 and GAD-7 for symptom monitoring. Content from the last session was reviewed. Throughout today's session, empathic reflections and validation were provided. This provider engaged [place] in setting a goal for snack calories. Psychoeducation regarding triggers for emotional eating was provided.   DSM-5 Diagnosis: 311 (F32.8) Other Specified Depressive Disorder, Emotional Eating Behaviors  Treatment Goal & Progress: Jasmine Contreras was seen for an initial appointment with this provider on May 28, 2018 during which the following treatment goal was established: decrease emotional eating. Jasmine Contreras has demonstrated progress in her goal as evidenced by increased awareness of hunger patterns, and willingness to identify triggers for emotional eating.   Plan: Jasmine Contreras continues to appear able and willing to participate as evidenced by engagement in reciprocal conversation, and asking questions for clarification as appropriate. Due to the upcoming holidays and this provider being out of  the office in the coming weeks,  the next appointment will be scheduled in three weeks. The next session will focus on reviewing triggers for emotional eating, and working towards the established treatment goal.

## 2018-07-11 ENCOUNTER — Encounter (INDEPENDENT_AMBULATORY_CARE_PROVIDER_SITE_OTHER): Payer: Self-pay | Admitting: Family Medicine

## 2018-07-11 ENCOUNTER — Ambulatory Visit (INDEPENDENT_AMBULATORY_CARE_PROVIDER_SITE_OTHER): Payer: Medicare Other | Admitting: Family Medicine

## 2018-07-11 VITALS — BP 131/89 | HR 77 | Temp 97.7°F | Ht 68.0 in | Wt 259.0 lb

## 2018-07-11 DIAGNOSIS — R7303 Prediabetes: Secondary | ICD-10-CM

## 2018-07-11 DIAGNOSIS — Z6839 Body mass index (BMI) 39.0-39.9, adult: Secondary | ICD-10-CM

## 2018-07-11 NOTE — Progress Notes (Signed)
Office: [phone]  /  Fax: [phone]    Date: July 16, 2018   Time Seen: 12:07pm Duration: 33 minutes Provider: Lawerance CruelGaytri Aariana Shankland, Psy.D. Type of Session: Individual Therapy  Type of Contact: Face-to-face  HPI: Elizabethwas referred by Dr. Felipa EvenerAngel Browndue to depression with emotional eating behaviors. Per the note for the visit withDr. Cindy HazyAngel Brownon May 15, 2018,"Elizabethis struggling with emotional eating and using food for comfort to the extent that it is negatively impacting herhealth. Sheoften snacks when sheis not hungry. Elizabethsometimes feels sheis out of control and then feels guilty that shemade poor food choices. Her PHQ-9 was 22 and she declined to see Dr. Dewaine CongerBarker at her last visit. She is working on cognitive behavior therapy strategies and stress eating. She shows no sign of suicidal or homicidal ideations." However, at the time of her appointment on May 15, 2018, Jasmine Manislizabeth agreed to meet with this provider. In addition, per the note for the initial visit withDr. Cindy HazyAngel Brownon March 28, 2018,Jasmine Contreras'sheaviest weight ever was 280pounds. During the initial appointment with Dr. Manson PasseyBrown, Jasmine ManisElizabeth reported experiencing the following:significant food craving issues; snackingfrequently in the evenings;frequently drinking liquids with calories;frequently makingpoor food choices;frequently eats larger portions than normal;binge eating behaviors; and strugglingwith emotional eating.  During the initial appointment with this provider, Jasmine ManisElizabeth reported experiencing "consistent weight gain and not being able to lose weight." She also noted she engages in "stress eating." Jasmine Manislizabeth noted her last episode of emotional eating was in the past twoweeks secondary to moving. She explained, "I am eating and packing." She also noted overeating, which last occurred this past Saturday. In addition to overeating, Jasmine Manislizabeth described grazing throughout the day.  She noted she tends to crave salty foods, such as potato chips. Moreover, Jasmine Manislizabeth indicated she began engaging in emotional eating and overeating approximately 11 years ago secondary to her husband's infidelity. Emotional eating was reportedly exacerbated approximatelytwoyears ago after [name] mother passed away. Jasmine Manislizabeth denied a history of binge eating, and she also denied a history of purging and engagement other compensatory strategies. She has never been diagnosed with an eating disorder. Furthermore, Elizabethwas asked to complete a questionnaire assessing various behaviors related to emotional eating. Elizabethendorsed the following: overeat when you are celebrating, experience food cravings on a regular basis, eat certain foods when you are anxious, stressed, depressed, or your feelings are hurt, use food to help you cope with emotional situations, find food is comforting to you, overeat when you are angry or upset, overeat when you are worried about something, overeat frequently when you are bored or lonely, not worry about what you eat when you are in a good mood, overeat when you are alone, but eat much less when you are with other people and eat as a reward.  During today's appointment, Jasmine Manislizabeth reported gaining five pounds during the holidays, and noted, "I'm off the rails."   Session Content: Session focused on the following treatment goal: decrease emotional eating. The session was initiated with the administration of the PHQ-9 and GAD-7, as well as a brief check-in. Jasmine Manislizabeth shared about recent events, including the holidays. She indicated, "I ate a lot, and gained some weight back." Jasmine Manislizabeth reported exploring her triggers for emotional eating since the last appointment, and indicated she ate out of habit based on the time of the day and due to stress. Nevertheless, Jasmine Manislizabeth indicated she is working towards "getting back on track" following her weigh in during her last  appointment with Dr. Dalbert GarnetBeasley on July 11, 2018. Jasmine Contreras's current eating habits were  explored. She described engaging in all or nothing thinking. Thus, psychoeducation regarding that cognitive distortion was provided and Jasmine ManisElizabeth noted, "My mind is saying I'm getting back on track, but my actions don't." She further added, "I can't plan my food as well as I can plan my furniture." As such, associated thoughts and feelings were explored and processed. Additionally, Jasmine Manislizabeth was engaged in problem-solving and thought challenging to assist with getting back to eating congruent to the meal plan. She expressed willingness to move foods around from the meal plan to increase likelihood of consumption. Jasmine Manislizabeth also identified various sweets she used to consume, but feels she cannot while on the meal plan resulting in her often eating larger quantities when engaging in all or nothing thinking. This provider and Jasmine Manislizabeth discussed ways to engage in portion control so that snack calories can be allocated to those foods. Jasmine Manislizabeth was receptive to today's session as evidenced by openness to sharing, responsiveness to feedback, and engagement in problem solving.  Mental Status Examination: Jasmine Manislizabeth presented late for the appointment. She presented as appropriately dressed and groomed. Jasmine Manislizabeth appeared her stated age and demonstrated adequate orientation to time, place, person, and purpose of the appointment. She also demonstrated appropriate eye contact. No psychomotor abnormalities or behavioral peculiarities noted. Her mood was euthymic with congruent affect. Her thought processes were logical, linear, and goal-directed. No hallucinations, delusions, bizarre thinking or behavior reported or observed. Judgment, insight, and impulse control appeared to be grossly intact. There was no evidence of paraphasias (i.e., errors in speech, gross mispronunciations, and word substitutions), repetition deficits, or  disturbances in volume or prosody (i.e., rhythm and intonation). There was no evidence of attention or memory impairments. Jasmine Manislizabeth denied current suicidal and homicidal ideation, intent or plan.  Structured Assessment Results: The Patient Health Questionnaire-9 (PHQ-9) is a self-report measure that assesses symptoms and severity of depression over the course of the last two weeks. Jasmine Manislizabeth obtained a score of 10 suggesting moderate depression. Jasmine Manislizabeth finds the endorsed symptoms to be somewhat difficult. Depression screen PHQ 2/9 07/16/2018  Decreased Interest 1  Down, Depressed, Hopeless 1  PHQ - 2 Score 2  Altered sleeping 2  Tired, decreased energy 2  Change in appetite 2  Feeling bad or failure about yourself  1  Trouble concentrating 1  Moving slowly or fidgety/restless 0  Suicidal thoughts 0  PHQ-9 Score 10  Difficult doing work/chores -   The Generalized Anxiety Disorder-7 (GAD-7) is a brief self-report measure that assesses symptoms of anxiety over the course of the last two weeks. Jasmine Manislizabeth obtained a score of 7 suggesting mild anxiety. GAD 7 : Generalized Anxiety Score 07/16/2018  Nervous, Anxious, on Edge 1  Control/stop worrying 1  Worry too much - different things 1  Trouble relaxing 1  Restless 1  Easily annoyed or irritable 1  Afraid - awful might happen 1  Total GAD 7 Score 7  Anxiety Difficulty Somewhat difficult   Interventions:  Administration of PHQ-9 and GAD-7 for symptom monitoring Review of content from the previous session Empathic reflections and validation Problem solving Processing thoughts and feelings Thought challenging Brief chart review  Psychoeducation regarding all or nothing thinking  DSM-5 Diagnosis: 311 (F32.8) Other Specified Depressive Disorder, Emotional Eating Behaviors  Treatment Goal & Progress: During the initial appointment with this provider, the following treatment goal was established: decrease emotional eating. Jasmine Manislizabeth has  demonstrated progress in her goal as evidenced by increased awareness of hunger patterns and triggers for emotional eating. Despite recent weight gain,  Jasmine Contreras was receptive to problem solving during today's appointment to help her get back on track.   Plan: Jasmine Contreras continues to appear able and willing to participate as evidenced by engagement in reciprocal conversation, and asking questions for clarification as appropriate. The next appointment will be scheduled in two weeks. The next session will focus on the introduction of thought defusion.

## 2018-07-12 NOTE — Progress Notes (Signed)
Office: [phone]  /  Fax: [phone]   HPI:   Chief Complaint: OBESITY Jasmine Contreras is here to discuss her progress with her obesity treatment plan. She is on the Category 2 plan and is following her eating plan approximately 10 % of the time. She states she is exercising 0 minutes 0 times per week. Jasmine Contreras had increased celebration eating over the holidays and over indulged. She is ready to get back on track with her eating plan.  Her weight is 259 lb (117.5 kg) today and has had a weight gain of 5 pounds over a period of 3 weeks since her last visit. She has lost 14 lbs since starting treatment with [place].  Pre-Diabetes Jasmine Contreras has a diagnosis of pre-diabetes based on her elevated Hgb A1c and was informed this puts her at greater risk of developing diabetes. She was prescribed metformin, but wasn't eating right and she was afraid to start taking metformin. She is ready to start working on her diet again.  ASSESSMENT AND PLAN:  Prediabetes  Class 2 severe obesity with serious comorbidity and body mass index (BMI) of 39.0 to 39.9 in adult, unspecified obesity type (HCC)  PLAN:  Pre-Diabetes Jasmine Contreras will continue to work on weight loss, exercise, and decreasing simple carbohydrates in her diet to help decrease the risk of diabetes. We discussed metformin including benefits and risks. She was informed that eating too many simple carbohydrates or too many calories at one sitting increases the likelihood of GI side effects. Jasmine Contreras is ok to hold metformin or start again. We will discuss further at her next visit. Jasmine Contreras will get back to her diet and exercise and agreed to follow up with [place] as directed to monitor her progress in 2 to 3 weeks.  I spent > than 50% of the 25 minute visit on counseling as documented in the note.  Obesity Jasmine Contreras is currently in the action stage of change. As such, her goal is to continue with weight loss efforts. She has agreed to follow the  Category 2 plan. Jasmine Contreras has been instructed to work up to a goal of 150 minutes of combined cardio and strengthening exercise per week for weight loss and overall health benefits. We discussed the following Behavioral Modification Strategies today: increasing lean protein intake, decreasing simple carbohydrates, no skipping meals, and better snacking choices.   Jasmine Contreras has agreed to follow up with our clinic in 2 to 3 weeks. She was informed of the importance of frequent follow up visits to maximize her success with intensive lifestyle modifications for her multiple health conditions.  ALLERGIES: Allergies  Allergen Reactions  . Peanuts [Peanut Oil] Hives  . Abilify [Aripiprazole]     somnelence  . Antivert [Meclizine Hcl] Nausea Only  . Codeine Itching and Nausea And Vomiting  . Honey Bee Treatment [Bee Venom]   . Hydrocodone Itching and Nausea And Vomiting  . Loratadine   . Oxycodone Hcl Itching and Nausea And Vomiting  . Prednisone     Red face  . Scopace [Scopolamine] Nausea Only  . Sertraline     Suicidal thoughts  . Shellfish Allergy Itching  . Tavist-D [[employer] Dayhist-D]     unknown  . Ultram [Tramadol] Nausea Only    MEDICATIONS: Current Outpatient Medications on File Prior to Visit  Medication Sig Dispense Refill  . BIOTIN FORTE PO Take 1 capsule by mouth every other day.     . Cholecalciferol (VITAMIN D-3 PO) Take 1,000 Int'l Units by mouth daily.    Marland Kitchen  colchicine 0.6 MG tablet Take 0.6 mg by mouth as needed. For gout flare ups    . EPINEPHrine 0.3 mg/0.3 mL IJ SOAJ injection Inject 0.3 mg into the muscle as needed.    . ferrous gluconate (IRON 27) 240 (27 FE) MG tablet Take 240 mg by mouth daily.    . hyoscyamine (LEVSIN SL) 0.125 MG SL tablet Place 0.125 mg under the tongue every 4 (four) hours as needed. Stomach spasms    . indomethacin (INDOCIN) 50 MG capsule Take 50 mg by mouth 2 (two) times daily with a meal.    . metFORMIN (GLUCOPHAGE) 500 MG tablet  Take 1 tablet (500 mg total) by mouth daily with breakfast. 30 tablet 0  . metoprolol succinate (TOPROL-XL) 25 MG 24 hr tablet TK 1 T PO QD  5  . nitroGLYCERIN (NITROSTAT) 0.4 MG SL tablet Place 0.4 mg under the tongue every 5 (five) minutes as needed for chest pain.    . pantoprazole (PROTONIX) 40 MG tablet Take 30- 60 min before your first and last meals of the day    . potassium chloride SA (K-DUR,KLOR-CON) 20 MEQ tablet Take 20 mEq by mouth 2 (two) times daily.     Marland Kitchen torsemide (DEMADEX) 10 MG tablet Take 10 mg by mouth daily.    Marland Kitchen UNABLE TO FIND Med Name: CBL oil 2 drops at bedtime    . vitamin B-12 (CYANOCOBALAMIN) 1000 MCG tablet Take 1,000 mcg by mouth daily.       No current facility-administered medications on file prior to visit.     PAST MEDICAL HISTORY: Past Medical History:  Diagnosis Date  . Acid reflux   . Anxiety   . Arthritis    trochanteric bursitis  . Back pain   . Chest pain   . Cough   . Depression   . Diarrhea   . Difficulty swallowing   . Dizziness   . Double vision   . Dry mouth   . Fatigue   . GERD (gastroesophageal reflux disease)   . Gout   . Headache   . Heart murmur   . Herpes   . Hiatal hernia   . Irritable bowel syndrome   . Itching   . Joint pain   . MVP (mitral valve prolapse)   . Neck stiffness   . Palpitations   . PUD (peptic ulcer disease)    s/p vagotomy  . Retaining fluid   . Ringing in ears   . Shortness of breath   . Stress   . Swelling of extremity   . Swelling of finger   . TIA (transient ischemic attack)   . Varicose veins   . Vertigo   . Vision changes   . Vitamin D deficiency   . Wheezing     PAST SURGICAL HISTORY: Past Surgical History:  Procedure Laterality Date  . BREAST EXCISIONAL BIOPSY Left   . CHOLECYSTECTOMY    . FOOT SURGERY    . partial gastrectomy with vagotomy    . right arthroscopic knee surgery    . trochanteric bursectomy    . VAGINAL HYSTERECTOMY     with oophorectomy    SOCIAL  HISTORY: Social History   Tobacco Use  . Smoking status: Never Smoker  . Smokeless tobacco: Never Used  Substance Use Topics  . Alcohol use: Yes    Alcohol/week: 0.0 standard drinks    Comment: 1 glass of wine with dinner  . Drug use: No    FAMILY HISTORY:  Family History  Problem Relation Age of Onset  . Diabetes Mother   . Hypertension Mother   . Kidney disease Mother   . Alcohol abuse Father   . Hyperlipidemia Father   . Hypertension Father   . Stroke Father   . Depression Father   . Sleep apnea Father     ROS: Review of Systems  Constitutional: Negative for weight loss.    PHYSICAL EXAM: Blood pressure 131/89, pulse 77, temperature 97.7 F (36.5 C), temperature source Oral, height 5\' 8"  (1.727 m), weight 259 lb (117.5 kg), SpO2 97 %. Body mass index is 39.38 kg/m. Physical Exam Vitals signs reviewed.  Constitutional:      Appearance: Normal appearance. She is obese.  Cardiovascular:     Rate and Rhythm: Normal rate.  Pulmonary:     Effort: Pulmonary effort is normal.  Musculoskeletal: Normal range of motion.  Skin:    General: Skin is warm and dry.  Neurological:     Mental Status: She is alert and oriented to person, place, and time.  Psychiatric:        Mood and Affect: Mood normal.        Behavior: Behavior normal.     RECENT LABS AND TESTS: BMET    Component Value Date/Time   NA 141 03/28/2018 1032   K 3.7 03/28/2018 1032   CL 100 03/28/2018 1032   CO2 27 03/28/2018 1032   GLUCOSE 87 03/28/2018 1032   GLUCOSE 171 (H) 08/06/2017 1834   BUN 9 03/28/2018 1032   CREATININE 0.70 03/28/2018 1032   CALCIUM 8.8 03/28/2018 1032   GFRNONAA 90 03/28/2018 1032   GFRAA 104 03/28/2018 1032   Lab Results  Component Value Date   HGBA1C 6.2 (H) 03/28/2018   HGBA1C 6.2 (H) 10/01/2015   Lab Results  Component Value Date   INSULIN 7.4 03/28/2018   CBC    Component Value Date/Time   WBC 9.1 01/16/2018 1652   RBC 4.69 01/16/2018 1652   HGB 12.3  01/16/2018 1652   HCT 37.6 01/16/2018 1652   PLT 275.0 01/16/2018 1652   MCV 80.1 01/16/2018 1652   MCH 26.4 08/06/2017 1834   MCHC 32.9 01/16/2018 1652   RDW 16.7 (H) 01/16/2018 1652   LYMPHSABS 2.1 01/16/2018 1652   MONOABS 0.6 01/16/2018 1652   EOSABS 0.0 01/16/2018 1652   BASOSABS 0.1 01/16/2018 1652   Iron/TIBC/Ferritin/ %Sat    Component Value Date/Time   IRON 45 10/01/2015 1003   TIBC 361 10/01/2015 1003   FERRITIN 43 10/01/2015 1003   IRONPCTSAT 12 (L) 10/01/2015 1003   Lipid Panel     Component Value Date/Time   CHOL 165 03/28/2018 1032   TRIG 77 03/28/2018 1032   HDL 64 03/28/2018 1032   LDLCALC 86 03/28/2018 1032   Hepatic Function Panel     Component Value Date/Time   PROT 6.9 03/28/2018 1032   ALBUMIN 4.2 03/28/2018 1032   AST 15 03/28/2018 1032   ALT 16 03/28/2018 1032   ALKPHOS 105 03/28/2018 1032   BILITOT 0.9 03/28/2018 1032   BILIDIR 0.1 08/09/2012 0845   IBILI 0.5 08/09/2012 0845      Component Value Date/Time   TSH 2.310 03/28/2018 1032   TSH 2.520 10/01/2015 1003   Results for AZAYA, GOEDDE (MRN [id number]) as of 07/12/2018 12:59  Ref. Range 03/28/2018 10:32  Vitamin D, 25-Hydroxy Latest Ref Range: 30.0 - 100.0 ng/mL 51.3    OBESITY BEHAVIORAL INTERVENTION VISIT  Today's visit was #  7   Starting weight: 273 lbs Starting date: 03/28/18 Today's weight : Weight: 259 lb (117.5 kg)  Today's date: 07/11/2018 Total lbs lost to date: 14  ASK: We discussed the diagnosis of obesity with Jasmine Contreras today and Jasmine Contreras agreed to give us permission to discuss obesity behavioral modification therapy today.  ASSESS: Jasmine Contreras has the diagnosis of obesity and her BMI today is 39.3. Jasmine Contreras is in the action stage of change.   ADVISE: Jasmine Contreras was educated on the multiple health risks of obesity as well as the benefit of weight loss to improve her health. She was advised of the need for long term treatment and the importance of  lifestyle modifications to improve her current health and to decrease her risk of future health problems.  AGREE: Multiple dietary modification options and treatment options were discussed and Jasmine Contreras agreed to follow the recommendations documented in the above note.  ARRANGE: Jasmine Contreras was educated on the importance of frequent visits to treat obesity as outlined per CMS and USPSTF guidelines and agreed to schedule her next follow up appointment today.  I, Kirke Corinara Soares, am acting as transcriptionist for Wilder Gladearen D. [place], MD  I have reviewed the above documentation for accuracy and completeness, and I agree with the above. -Quillian Quincearen Moraima Burd, MD

## 2018-07-16 ENCOUNTER — Ambulatory Visit (INDEPENDENT_AMBULATORY_CARE_PROVIDER_SITE_OTHER): Payer: BLUE CROSS/BLUE SHIELD | Admitting: Psychology

## 2018-07-16 DIAGNOSIS — F3289 Other specified depressive episodes: Secondary | ICD-10-CM | POA: Diagnosis not present

## 2018-07-24 NOTE — Progress Notes (Signed)
Office: [phone]  /  Fax: [phone]    Date: August 01, 2018   Time Seen: 9:00am Duration: 28 minutes Provider: Lawerance Cruel, Psy.D. Type of Session: Individual Therapy  Type of Contact: Face-to-face  Session Content: Abigale is a 68 year old female presenting for a follow-up appointment to address the previously established treatment goal of decreasing emotional eating. The session was initiated with the administration of the PHQ-9 and GAD-7, as well as a brief check-in. Rebeca shared a delay in the closing of her home resulting in a "blue funk." She also reported experiencing nose bleeds for which she has an appointment on February 3rd. Regarding eating, Emyah reported, "It's awful." She shared she is eating out "a lot." Thus, it was recommended she speak with Dr. Dalbert Garnet during their appointment today; she agreed. Sandria noted, "It will be better once I get into my home in two weeks." For eating out, this provider and Akia discussed making better choices, including focusing on portion control, and being mindful of protein intake. It was also recommended she carry the handouts outlining options for eating out that were provided by Dr. Dalbert Garnet in her purse, and also take pictures on her phone as she discussed "never having the handouts" when she goes out to eat. Modine also reported emotional eating episodes since the last appointment with this provider, specifically when she is bored. As such, psychoeducation regarding the connection between thoughts, feelings, and behaviors was provided. Besa was given a handout outlining the aforementioned. Additionally, psychoeducation regarding pleasurable activities, including its impact on emotional eating was provided. Sophie was provided with a handout with various options of pleasurable activities, and was encouraged to engage in different activities between now and the next appointment with this provider. Romelia  agreed. A goal of engaging in one pleasurable activity a day was established. Dedria was also agreeable to engaging in pleasurable activities when experiencing triggers for emotional eating. Furthermore, this provider discussed termination planning, including the option for a referral for longer-term therapeutic services. Atalie requested an appointment in two to three weeks with this provider so that the appointment is after the closing of her house. Overall, she was receptive to today's session as evidenced by openness to sharing, responsiveness to feedback, and willingness to engage in pleasurable activities.  Mental Status Examination: Adelene arrived on time for the appointment. She presented as appropriately dressed and groomed. Ree appeared her stated age and demonstrated adequate orientation to time, place, person, and purpose of the appointment. She also demonstrated appropriate eye contact. No psychomotor abnormalities or behavioral peculiarities noted. Her mood was euthymic with congruent affect. Her thought processes were logical, linear, and goal-directed. No hallucinations, delusions, bizarre thinking or behavior reported or observed. Judgment, insight, and impulse control appeared to be grossly intact. There was no evidence of paraphasias (i.e., errors in speech, gross mispronunciations, and word substitutions), repetition deficits, or disturbances in volume or prosody (i.e., rhythm and intonation). There was no evidence of attention or memory impairments. Shirleyann denied current suicidal and homicidal ideation, plan and intent.   Structured Assessment Results: The Patient Health Questionnaire-9 (PHQ-9) is a self-report measure that assesses symptoms and severity of depression over the course of the last two weeks. Thyme obtained a score of 8 suggesting mild depression. Anelie finds the endorsed symptoms to be somewhat difficult. Depression screen PHQ 2/9 08/01/2018    Decreased Interest 1  Down, Depressed, Hopeless 1  PHQ - 2 Score 2  Altered sleeping 2  Tired, decreased energy 2  Change in  appetite 0  Feeling bad or failure about yourself  1  Trouble concentrating 1  Moving slowly or fidgety/restless 0  Suicidal thoughts 0  PHQ-9 Score 8  Difficult doing work/chores -   The Generalized Anxiety Disorder-7 (GAD-7) is a brief self-report measure that assesses symptoms of anxiety over the course of the last two weeks. Norrene obtained a score of 7 suggesting mild anxiety. GAD 7 : Generalized Anxiety Score 08/01/2018  Nervous, Anxious, on Edge 1  Control/stop worrying 2  Worry too much - different things 1  Trouble relaxing 1  Restless 0  Easily annoyed or irritable 1  Afraid - awful might happen 1  Total GAD 7 Score 7  Anxiety Difficulty Somewhat difficult   Interventions:  Administration of PHQ-9 and GAD-7 for symptom monitoring Empathic reflections and validation Goal setting Psychoeducation regarding the connection between thoughts, feelings, and behaviors Psychoeducation regarding pleasurable activities Termination planning Discussed option for a referral for longer-term therapeutic services Brief chart review  DSM-5 Diagnosis: 311 (F32.8) Other Specified Depressive Disorder, Emotional Eating Behaviors  Treatment Goal & Progress: During the initial appointment with this provider, the following treatment goal was established: decrease emotional eating. Naydine has demonstrated progress in her goal as evidenced by increased awareness of hunger patterns. While she has deviated from the prescribed meal plan due to current circumstances, Mercedies has demonstrated an increased awareness of her triggers for emotional eating. She also demonstrated willingness during today's appointment to engage in pleasurable activities to help improve mood as well as assist with coping with triggers for emotional eating.   Plan: Arnella continues to  appear able and willing to participate as evidenced by engagement in reciprocal conversation, and asking questions for clarification as appropriate. The next appointment will be scheduled in two to three weeks. The next session will focus on reviewing pleasurable activities, and working towards the established treatment goal.

## 2018-07-26 DIAGNOSIS — M109 Gout, unspecified: Secondary | ICD-10-CM | POA: Diagnosis not present

## 2018-07-26 DIAGNOSIS — R609 Edema, unspecified: Secondary | ICD-10-CM | POA: Diagnosis not present

## 2018-07-26 DIAGNOSIS — Z23 Encounter for immunization: Secondary | ICD-10-CM | POA: Diagnosis not present

## 2018-07-26 DIAGNOSIS — H6121 Impacted cerumen, right ear: Secondary | ICD-10-CM | POA: Diagnosis not present

## 2018-07-26 DIAGNOSIS — S93402A Sprain of unspecified ligament of left ankle, initial encounter: Secondary | ICD-10-CM | POA: Diagnosis not present

## 2018-08-01 ENCOUNTER — Ambulatory Visit (INDEPENDENT_AMBULATORY_CARE_PROVIDER_SITE_OTHER): Payer: BLUE CROSS/BLUE SHIELD | Admitting: Psychology

## 2018-08-01 ENCOUNTER — Encounter (INDEPENDENT_AMBULATORY_CARE_PROVIDER_SITE_OTHER): Payer: Self-pay | Admitting: Family Medicine

## 2018-08-01 ENCOUNTER — Ambulatory Visit (INDEPENDENT_AMBULATORY_CARE_PROVIDER_SITE_OTHER): Payer: Medicare Other | Admitting: Family Medicine

## 2018-08-01 ENCOUNTER — Other Ambulatory Visit (INDEPENDENT_AMBULATORY_CARE_PROVIDER_SITE_OTHER): Payer: Self-pay | Admitting: Family Medicine

## 2018-08-01 VITALS — BP 137/73 | HR 66 | Temp 98.0°F | Ht 68.0 in | Wt 258.0 lb

## 2018-08-01 DIAGNOSIS — R7303 Prediabetes: Secondary | ICD-10-CM

## 2018-08-01 DIAGNOSIS — F3289 Other specified depressive episodes: Secondary | ICD-10-CM | POA: Diagnosis not present

## 2018-08-01 DIAGNOSIS — E876 Hypokalemia: Secondary | ICD-10-CM | POA: Diagnosis not present

## 2018-08-01 DIAGNOSIS — Z6839 Body mass index (BMI) 39.0-39.9, adult: Secondary | ICD-10-CM

## 2018-08-01 DIAGNOSIS — E559 Vitamin D deficiency, unspecified: Secondary | ICD-10-CM | POA: Diagnosis not present

## 2018-08-02 LAB — COMPREHENSIVE METABOLIC PANEL
ALBUMIN: 4.4 g/dL (ref 3.8–4.8)
ALK PHOS: 108 IU/L (ref 39–117)
ALT: 19 IU/L (ref 0–32)
AST: 16 IU/L (ref 0–40)
Albumin/Globulin Ratio: 1.6 (ref 1.2–2.2)
BUN / CREAT RATIO: 6 — AB (ref 12–28)
BUN: 5 mg/dL — ABNORMAL LOW (ref 8–27)
Bilirubin Total: 0.5 mg/dL (ref 0.0–1.2)
CO2: 23 mmol/L (ref 20–29)
CREATININE: 0.9 mg/dL (ref 0.57–1.00)
Calcium: 9.2 mg/dL (ref 8.7–10.3)
Chloride: 100 mmol/L (ref 96–106)
GFR, EST AFRICAN AMERICAN: 77 mL/min/{1.73_m2} (ref >59)
GFR, EST NON AFRICAN AMERICAN: 66 mL/min/{1.73_m2} (ref >59)
GLOBULIN, TOTAL: 2.8 g/dL (ref 1.5–4.5)
Glucose: 119 mg/dL — ABNORMAL HIGH (ref 65–99)
Potassium: 3.7 mmol/L (ref 3.5–5.2)
SODIUM: 139 mmol/L (ref 134–144)
Total Protein: 7.2 g/dL (ref 6.0–8.5)

## 2018-08-02 LAB — INSULIN, RANDOM: INSULIN: 24.1 u[IU]/mL (ref 2.6–24.9)

## 2018-08-02 LAB — VITAMIN D 25 HYDROXY (VIT D DEFICIENCY, FRACTURES): VIT D 25 HYDROXY: 52.5 ng/mL (ref 30.0–100.0)

## 2018-08-02 NOTE — Progress Notes (Signed)
Office: [phone]  /  Fax: [phone]   HPI:   Chief Complaint: OBESITY Jasmine Contreras is here to discuss her progress with her obesity treatment plan. She is on the Category 2 plan and is following her eating plan approximately 10 % of the time. She states she is exercising 0 minutes 0 times per week. Jasmine Contreras has struggled to follow her plan closely while getting ready to move and is eating out more.  Her weight is 258 lb (117 kg) today and has had a weight loss of 1 pound over a period of 2 weeks since her last visit. She has lost 15 lbs since starting treatment with Jasmine Contreras.  Hypokalemia Deon is on torsemide 10mg  and potassium chloride . She is due for labs today. Raylinn denies palpitations or muscle cramping.  Vitamin D deficiency [name] has a diagnosis of vitamin D deficiency. She is currently taking vit D and is due for labs today.  Zakari denies nausea, vomiting, or muscle weakness.  Pre-Diabetes [name] has a diagnosis of pre-diabetes based on her elevated Hgb A1c and was informed this puts her at greater risk of developing diabetes. She is not taking metformin regularly due to GI upset. She continues to work on diet and exercise to decrease risk of diabetes.   ASSESSMENT AND PLAN:  Hypokalemia - Plan: Comprehensive metabolic panel  Vitamin D deficiency - Plan: VITAMIN D 25 Hydroxy (Vit-D Deficiency, Fractures)  Prediabetes - Plan: Comprehensive metabolic panel, Hemoglobin A1c, Insulin, random  Class 2 severe obesity with serious comorbidity and body mass index (BMI) of 39.0 to 39.9 in adult, unspecified obesity type (HCC)  PLAN:  Hypokalemia Hendel will have labs ordered today and she agrees to follow up in 3 weeks.  Vitamin D Deficiency Jasmine Contreras was informed that low vitamin D levels contributes to fatigue and are associated with obesity, breast, and colon cancer. She agrees to continue to take Vit D @1 ,000 IU every day and will follow up for  routine testing of vitamin D, at least 2-3 times per year. She was informed of the risk of over-replacement of vitamin D and agrees to not increase her dose unless she discusses this with Jasmine Contreras first. A vitamin D level will be drawn today and Jasmine Contreras will follow up as directed.  Pre-Diabetes Jasmine Contreras will continue to work on weight loss, exercise, and decreasing simple carbohydrates in her diet to help decrease the risk of diabetes. We discussed metformin including benefits and risks. She was informed that eating too many simple carbohydrates or too many calories at one sitting increases the likelihood of GI side effects. Jasmine Contreras agrees to change to metformin 1/2 tablet PO qd and a prescription was not written today. Labs were ordered today and Jasmine Contreras agreed to follow up with Jasmine Contreras as directed to monitor her progress.  Obesity Jasmine Contreras is currently in the action stage of change. As such, her goal is to continue with weight loss efforts. She has agreed to follow the Category 2 plan. Eating out strategies were discussed. Angalina has been instructed to work up to a goal of 150 minutes of combined cardio and strengthening exercise per week for weight loss and overall health benefits. We discussed the following Behavioral Modification Strategies today: increasing lean protein intake, decreasing simple carbohydrates, and work on meal planning and easy cooking plans.  Jasmine Contreras has agreed to follow up with our clinic in 3 weeks. She was informed of the importance of frequent follow up visits to maximize her success with intensive lifestyle modifications for  her multiple health conditions.  ALLERGIES: Allergies  Allergen Reactions  . Peanuts [Peanut Oil] Hives  . Abilify [Aripiprazole]     somnelence  . Antivert [Meclizine Hcl] Nausea Only  . Codeine Itching and Nausea And Vomiting  . Honey Bee Treatment [Bee Venom]   . Hydrocodone Itching and Nausea And Vomiting  . Loratadine   . Oxycodone Hcl  Itching and Nausea And Vomiting  . Prednisone     Red face  . Scopace [Scopolamine] Nausea Only  . Sertraline     Suicidal thoughts  . Shellfish Allergy Itching  . Tavist-D [Albertsons Dayhist-D]     unknown  . Ultram [Tramadol] Nausea Only    MEDICATIONS: Current Outpatient Medications on File Prior to Visit  Medication Sig Dispense Refill  . BIOTIN FORTE PO Take 1 capsule by mouth every other day.     . Cholecalciferol (VITAMIN D-3 PO) Take 1,000 Int'l Units by mouth daily.    . colchicine 0.6 MG tablet Take 0.6 mg by mouth as needed. For gout flare ups    . EPINEPHrine 0.3 mg/0.3 mL IJ SOAJ injection Inject 0.3 mg into the muscle as needed.    . ferrous gluconate (IRON 27) 240 (27 FE) MG tablet Take 240 mg by mouth daily.    . hyoscyamine (LEVSIN SL) 0.125 MG SL tablet Place 0.125 mg under the tongue every 4 (four) hours as needed. Stomach spasms    . indomethacin (INDOCIN) 50 MG capsule Take 50 mg by mouth 2 (two) times daily with a meal.    . metFORMIN (GLUCOPHAGE) 500 MG tablet Take 1 tablet (500 mg total) by mouth daily with breakfast. (Patient taking differently: Take 500 mg by mouth daily with breakfast. Take 1/2 tab daily) 30 tablet 0  . metoprolol succinate (TOPROL-XL) 25 MG 24 hr tablet TK 1 T PO QD  5  . nitroGLYCERIN (NITROSTAT) 0.4 MG SL tablet Place 0.4 mg under the tongue every 5 (five) minutes as needed for chest pain.    . pantoprazole (PROTONIX) 40 MG tablet Take 30- 60 min before your first and last meals of the day    . potassium chloride SA (K-DUR,KLOR-CON) 20 MEQ tablet Take 20 mEq by mouth 2 (two) times daily.     Marland Kitchen. torsemide (DEMADEX) 10 MG tablet Take 10 mg by mouth daily.    Marland Kitchen. UNABLE TO FIND Med Name: CBL oil 2 drops at bedtime    . vitamin B-12 (CYANOCOBALAMIN) 1000 MCG tablet Take 1,000 mcg by mouth daily.       No current facility-administered medications on file prior to visit.     PAST MEDICAL HISTORY: Past Medical History:  Diagnosis Date  . Acid  reflux   . Anxiety   . Arthritis    trochanteric bursitis  . Back pain   . Chest pain   . Cough   . Depression   . Diarrhea   . Difficulty swallowing   . Dizziness   . Double vision   . Dry mouth   . Fatigue   . GERD (gastroesophageal reflux disease)   . Gout   . Headache   . Heart murmur   . Herpes   . Hiatal hernia   . Irritable bowel syndrome   . Itching   . Joint pain   . MVP (mitral valve prolapse)   . Neck stiffness   . Palpitations   . PUD (peptic ulcer disease)    s/p vagotomy  . Retaining fluid   .  Ringing in ears   . Shortness of breath   . Stress   . Swelling of extremity   . Swelling of finger   . TIA (transient ischemic attack)   . Varicose veins   . Vertigo   . Vision changes   . Vitamin D deficiency   . Wheezing     PAST SURGICAL HISTORY: Past Surgical History:  Procedure Laterality Date  . BREAST EXCISIONAL BIOPSY Left   . CHOLECYSTECTOMY    . FOOT SURGERY    . partial gastrectomy with vagotomy    . right arthroscopic knee surgery    . trochanteric bursectomy    . VAGINAL HYSTERECTOMY     with oophorectomy    SOCIAL HISTORY: Social History   Tobacco Use  . Smoking status: Never Smoker  . Smokeless tobacco: Never Used  Substance Use Topics  . Alcohol use: Yes    Alcohol/week: 0.0 standard drinks    Comment: 1 glass of wine with dinner  . Drug use: No    FAMILY HISTORY: Family History  Problem Relation Age of Onset  . Diabetes Mother   . Hypertension Mother   . Kidney disease Mother   . Alcohol abuse Father   . Hyperlipidemia Father   . Hypertension Father   . Stroke Father   . Depression Father   . Sleep apnea Father     ROS: Review of Systems  Constitutional: Positive for weight loss.  Cardiovascular: Negative for palpitations.  Gastrointestinal: Negative for nausea and vomiting.  Musculoskeletal:       Negative for muscle cramping.    PHYSICAL EXAM: Blood pressure 137/73, pulse 66, temperature 98 F (36.7  C), temperature source Oral, height 5\' 8"  (1.727 m), weight 258 lb (117 kg), SpO2 100 %. Body mass index is 39.23 kg/m. Physical Exam Vitals signs reviewed.  Constitutional:      Appearance: Normal appearance. She is obese.  Cardiovascular:     Rate and Rhythm: Normal rate.  Pulmonary:     Effort: Pulmonary effort is normal.  Musculoskeletal: Normal range of motion.  Skin:    General: Skin is warm and dry.  Neurological:     Mental Status: She is alert and oriented to person, place, and time.  Psychiatric:        Mood and Affect: Mood normal.        Behavior: Behavior normal.     RECENT LABS AND TESTS: BMET    Component Value Date/Time   NA 139 08/01/2018 1303   K 3.7 08/01/2018 1303   CL 100 08/01/2018 1303   CO2 23 08/01/2018 1303   GLUCOSE 119 (H) 08/01/2018 1303   GLUCOSE 171 (H) 08/06/2017 1834   BUN 5 (L) 08/01/2018 1303   CREATININE 0.90 08/01/2018 1303   CALCIUM 9.2 08/01/2018 1303   GFRNONAA 66 08/01/2018 1303   GFRAA 77 08/01/2018 1303   Lab Results  Component Value Date   HGBA1C 6.2 (H) 03/28/2018   HGBA1C 6.2 (H) 10/01/2015   Lab Results  Component Value Date   INSULIN 24.1 08/01/2018   INSULIN 7.4 03/28/2018   CBC    Component Value Date/Time   WBC 9.1 01/16/2018 1652   RBC 4.69 01/16/2018 1652   HGB 12.3 01/16/2018 1652   HCT 37.6 01/16/2018 1652   PLT 275.0 01/16/2018 1652   MCV 80.1 01/16/2018 1652   MCH 26.4 08/06/2017 1834   MCHC 32.9 01/16/2018 1652   RDW 16.7 (H) 01/16/2018 1652   LYMPHSABS 2.1 01/16/2018  1652   MONOABS 0.6 01/16/2018 1652   EOSABS 0.0 01/16/2018 1652   BASOSABS 0.1 01/16/2018 1652   Iron/TIBC/Ferritin/ %Sat    Component Value Date/Time   IRON 45 10/01/2015 1003   TIBC 361 10/01/2015 1003   FERRITIN 43 10/01/2015 1003   IRONPCTSAT 12 (L) 10/01/2015 1003   Lipid Panel     Component Value Date/Time   CHOL 165 03/28/2018 1032   TRIG 77 03/28/2018 1032   HDL 64 03/28/2018 1032   LDLCALC 86 03/28/2018 1032    Hepatic Function Panel     Component Value Date/Time   PROT 7.2 08/01/2018 1303   ALBUMIN 4.4 08/01/2018 1303   AST 16 08/01/2018 1303   ALT 19 08/01/2018 1303   ALKPHOS 108 08/01/2018 1303   BILITOT 0.5 08/01/2018 1303   BILIDIR 0.1 08/09/2012 0845   IBILI 0.5 08/09/2012 0845      Component Value Date/Time   TSH 2.310 03/28/2018 1032   TSH 2.520 10/01/2015 1003   Results for TERRILYN, OZANICH (MRN [id number]) as of 08/02/2018 09:16  Ref. Range 08/01/2018 13:03  Vitamin D, 25-Hydroxy Latest Ref Range: 30.0 - 100.0 ng/mL 52.5   OBESITY BEHAVIORAL INTERVENTION VISIT  Today's visit was # 8   Starting weight: 273 lbs Starting date: 03/28/18 Today's weight : Weight: 258 lb (117 kg)  Today's date: 08/01/2018 Total lbs lost to date: 15 At least 15 minutes were spent on discussing the following behavioral intervention visit.  ASK: We discussed the diagnosis of obesity with Tennis Must Lauderback today and Suesan agreed to give Jasmine Contreras permission to discuss obesity behavioral modification therapy today.  ASSESS: Dellie has the diagnosis of obesity and her BMI today is 39.2. Rayla is in the action stage of change.   ADVISE: Yaslin was educated on the multiple health risks of obesity as well as the benefit of weight loss to improve her health. She was advised of the need for long term treatment and the importance of lifestyle modifications to improve her current health and to decrease her risk of future health problems.  AGREE: Multiple dietary modification options and treatment options were discussed and Azayah agreed to follow the recommendations documented in the above note.  ARRANGE: Sway was educated on the importance of frequent visits to treat obesity as outlined per CMS and USPSTF guidelines and agreed to schedule her next follow up appointment today.  IKirke Corin, CMA, am acting as transcriptionist for Wilder Glade, MD  I have reviewed the above  documentation for accuracy and completeness, and I agree with the above. -Quillian Quince, MD

## 2018-08-06 DIAGNOSIS — H6123 Impacted cerumen, bilateral: Secondary | ICD-10-CM | POA: Diagnosis not present

## 2018-08-06 DIAGNOSIS — H9313 Tinnitus, bilateral: Secondary | ICD-10-CM | POA: Diagnosis not present

## 2018-08-06 DIAGNOSIS — J342 Deviated nasal septum: Secondary | ICD-10-CM | POA: Diagnosis not present

## 2018-08-06 DIAGNOSIS — Z7289 Other problems related to lifestyle: Secondary | ICD-10-CM | POA: Diagnosis not present

## 2018-08-13 ENCOUNTER — Telehealth (INDEPENDENT_AMBULATORY_CARE_PROVIDER_SITE_OTHER): Payer: Self-pay | Admitting: Family Medicine

## 2018-08-13 NOTE — Telephone Encounter (Signed)
Patient called and wanted to be sure lab results from last visit sent to pcp due to meds needing to be changed.

## 2018-08-27 ENCOUNTER — Encounter (INDEPENDENT_AMBULATORY_CARE_PROVIDER_SITE_OTHER): Payer: Self-pay | Admitting: Family Medicine

## 2018-08-27 ENCOUNTER — Ambulatory Visit (INDEPENDENT_AMBULATORY_CARE_PROVIDER_SITE_OTHER): Payer: Medicare Other | Admitting: Psychology

## 2018-08-27 ENCOUNTER — Ambulatory Visit (INDEPENDENT_AMBULATORY_CARE_PROVIDER_SITE_OTHER): Payer: Medicare Other | Admitting: Family Medicine

## 2018-08-27 VITALS — BP 122/71 | HR 71 | Temp 98.2°F | Ht 68.0 in | Wt 262.0 lb

## 2018-08-27 DIAGNOSIS — R7303 Prediabetes: Secondary | ICD-10-CM | POA: Diagnosis not present

## 2018-08-27 DIAGNOSIS — F3289 Other specified depressive episodes: Secondary | ICD-10-CM

## 2018-08-27 DIAGNOSIS — Z6841 Body Mass Index (BMI) 40.0 and over, adult: Secondary | ICD-10-CM | POA: Diagnosis not present

## 2018-08-27 MED ORDER — METFORMIN HCL 500 MG PO TABS: 500.0000 mg | ORAL_TABLET | Freq: Every day | ORAL | 0 refills | Status: DC

## 2018-08-27 NOTE — Progress Notes (Signed)
Office: [phone]  /  Fax: [phone]   HPI:   Chief Complaint: OBESITY Jasmine Contreras is here to discuss her progress with her obesity treatment plan. She is on the Category 2 plan and is following her eating plan approximately 0 % of the time. She states she is exercising 0 minutes 0 times per week. Jasmine Contreras is in the process of moving and hasn't been able to concentrate on weight loss. She is struggling with emotional eating and meal prepping.  Her weight is 262 lb (118.8 kg) today and has had a weight gain of 4 pounds over a period of 3 weeks since her last visit.She has lost 11 lbs since starting treatment with [place].  Pre-Diabetes Jasmine Contreras has a diagnosis of pre-diabetes based on her elevated Hgb A1c and was informed this puts her at greater risk of developing diabetes. She is out of metformin currently and wants to get prescription coverage from her insurance. She continues to work on diet and exercise to decrease risk of diabetes.   ASSESSMENT AND PLAN:  Prediabetes - Plan: metFORMIN (GLUCOPHAGE) 500 MG tablet  Class 3 severe obesity with serious comorbidity and body mass index (BMI) of 40.0 to 44.9 in adult, unspecified obesity type (HCC)  PLAN:  Pre-Diabetes Jasmine Contreras will continue to work on weight loss, exercise, and decreasing simple carbohydrates in her diet to help decrease the risk of diabetes. We discussed metformin including benefits and risks. She was informed that eating too many simple carbohydrates or too many calories at one sitting increases the likelihood of GI side effects. Jasmine Contreras agreed to continue metformin  qAM #30 with no refills and a prescription was written today. Jasmine Contreras agreed to follow up with [place] as directed to monitor her progress in 4 weeks.  Obesity Jasmine Contreras is currently in the action stage of change. As such, her goal is to continue with weight loss efforts. She has agreed to follow the Category 2 plan. We will have Jasmine Contreras follow up  with Jasmine Contreras, our registered dietitian in 2 weeks to help work with meal planning and prepping. Jasmine Contreras has been instructed to work up to a goal of 150 minutes of combined cardio and strengthening exercise per week for weight loss and overall health benefits. We discussed the following Behavioral Modification Strategies today: increasing lean protein intake, decreasing simple carbohydrates, and work on meal planning and easy cooking plans.  Jasmine Contreras has agreed to follow up with Dr Dewaine Conger in 2 weeks and with our clinic in 4 weeks. She was informed of the importance of frequent follow up visits to maximize her success with intensive lifestyle modifications for her multiple health conditions.  ALLERGIES: Allergies  Allergen Reactions  . Peanuts [Peanut Oil] Hives  . Abilify [Aripiprazole]     somnelence  . Antivert [Meclizine Hcl] Nausea Only  . Codeine Itching and Nausea And Vomiting  . Honey Bee Treatment [Bee Venom]   . Hydrocodone Itching and Nausea And Vomiting  . Loratadine   . Oxycodone Hcl Itching and Nausea And Vomiting  . Prednisone     Red face  . Scopace [Scopolamine] Nausea Only  . Sertraline     Suicidal thoughts  . Shellfish Allergy Itching  . Tavist-D [Albertsons Dayhist-D]     unknown  . Ultram [Tramadol] Nausea Only    MEDICATIONS: Current Outpatient Medications on File Prior to Visit  Medication Sig Dispense Refill  . BIOTIN FORTE PO Take 1 capsule by mouth every other day.     . Cholecalciferol (VITAMIN D-3 PO)  Take 1,000 Int'l Units by mouth daily.    . colchicine 0.6 MG tablet Take 0.6 mg by mouth as needed. For gout flare ups    . EPINEPHrine 0.3 mg/0.3 mL IJ SOAJ injection Inject 0.3 mg into the muscle as needed.    . ferrous gluconate (IRON 27) 240 (27 FE) MG tablet Take 240 mg by mouth daily.    . hyoscyamine (LEVSIN SL) 0.125 MG SL tablet Place 0.125 mg under the tongue every 4 (four) hours as needed. Stomach spasms    . indomethacin (INDOCIN) 50 MG  capsule Take 50 mg by mouth 2 (two) times daily with a meal.    . metoprolol succinate (TOPROL-XL) 25 MG 24 hr tablet TK 1 T PO QD  5  . nitroGLYCERIN (NITROSTAT) 0.4 MG SL tablet Place 0.4 mg under the tongue every 5 (five) minutes as needed for chest pain.    . pantoprazole (PROTONIX) 40 MG tablet Take 30- 60 min before your first and last meals of the day    . potassium chloride SA (K-DUR,KLOR-CON) 20 MEQ tablet Take 20 mEq by mouth 2 (two) times daily.     Marland Kitchen torsemide (DEMADEX) 10 MG tablet Take 10 mg by mouth daily.    Marland Kitchen UNABLE TO FIND Med Name: CBL oil 2 drops at bedtime    . vitamin B-12 (CYANOCOBALAMIN) 1000 MCG tablet Take 1,000 mcg by mouth daily.       No current facility-administered medications on file prior to visit.     PAST MEDICAL HISTORY: Past Medical History:  Diagnosis Date  . Acid reflux   . Anxiety   . Arthritis    trochanteric bursitis  . Back pain   . Chest pain   . Cough   . Depression   . Diarrhea   . Difficulty swallowing   . Dizziness   . Double vision   . Dry mouth   . Fatigue   . GERD (gastroesophageal reflux disease)   . Gout   . Headache   . Heart murmur   . Herpes   . Hiatal hernia   . Irritable bowel syndrome   . Itching   . Joint pain   . MVP (mitral valve prolapse)   . Neck stiffness   . Palpitations   . PUD (peptic ulcer disease)    s/p vagotomy  . Retaining fluid   . Ringing in ears   . Shortness of breath   . Stress   . Swelling of extremity   . Swelling of finger   . TIA (transient ischemic attack)   . Varicose veins   . Vertigo   . Vision changes   . Vitamin D deficiency   . Wheezing     PAST SURGICAL HISTORY: Past Surgical History:  Procedure Laterality Date  . BREAST EXCISIONAL BIOPSY Left   . CHOLECYSTECTOMY    . FOOT SURGERY    . partial gastrectomy with vagotomy    . right arthroscopic knee surgery    . trochanteric bursectomy    . VAGINAL HYSTERECTOMY     with oophorectomy    SOCIAL HISTORY: Social  History   Tobacco Use  . Smoking status: Never Smoker  . Smokeless tobacco: Never Used  Substance Use Topics  . Alcohol use: Yes    Alcohol/week: 0.0 standard drinks    Comment: 1 glass of wine with dinner  . Drug use: No    FAMILY HISTORY: Family History  Problem Relation Age of Onset  .  Diabetes Mother   . Hypertension Mother   . Kidney disease Mother   . Alcohol abuse Father   . Hyperlipidemia Father   . Hypertension Father   . Stroke Father   . Depression Father   . Sleep apnea Father     ROS: Review of Systems  Constitutional: Negative for weight loss.    PHYSICAL EXAM: Blood pressure 122/71, pulse 71, temperature 98.2 F (36.8 C), temperature source Oral, height 5\' 8"  (1.727 m), weight 262 lb (118.8 kg), SpO2 97 %. Body mass index is 39.84 kg/m. Physical Exam Vitals signs reviewed.  Constitutional:      Appearance: Normal appearance. She is obese.  Cardiovascular:     Rate and Rhythm: Normal rate.  Pulmonary:     Effort: Pulmonary effort is normal.  Musculoskeletal: Normal range of motion.  Skin:    General: Skin is warm and dry.  Neurological:     Mental Status: She is alert and oriented to person, place, and time.  Psychiatric:        Mood and Affect: Mood normal.        Behavior: Behavior normal.     RECENT LABS AND TESTS: BMET    Component Value Date/Time   NA 139 08/01/2018 1303   K 3.7 08/01/2018 1303   CL 100 08/01/2018 1303   CO2 23 08/01/2018 1303   GLUCOSE 119 (H) 08/01/2018 1303   GLUCOSE 171 (H) 08/06/2017 1834   BUN 5 (L) 08/01/2018 1303   CREATININE 0.90 08/01/2018 1303   CALCIUM 9.2 08/01/2018 1303   GFRNONAA 66 08/01/2018 1303   GFRAA 77 08/01/2018 1303   Lab Results  Component Value Date   HGBA1C 6.2 (H) 03/28/2018   HGBA1C 6.2 (H) 10/01/2015   Lab Results  Component Value Date   INSULIN 24.1 08/01/2018   INSULIN 7.4 03/28/2018   CBC    Component Value Date/Time   WBC 9.1 01/16/2018 1652   RBC 4.69 01/16/2018  1652   HGB 12.3 01/16/2018 1652   HCT 37.6 01/16/2018 1652   PLT 275.0 01/16/2018 1652   MCV 80.1 01/16/2018 1652   MCH 26.4 08/06/2017 1834   MCHC 32.9 01/16/2018 1652   RDW 16.7 (H) 01/16/2018 1652   LYMPHSABS 2.1 01/16/2018 1652   MONOABS 0.6 01/16/2018 1652   EOSABS 0.0 01/16/2018 1652   BASOSABS 0.1 01/16/2018 1652   Iron/TIBC/Ferritin/ %Sat    Component Value Date/Time   IRON 45 10/01/2015 1003   TIBC 361 10/01/2015 1003   FERRITIN 43 10/01/2015 1003   IRONPCTSAT 12 (L) 10/01/2015 1003   Lipid Panel     Component Value Date/Time   CHOL 165 03/28/2018 1032   TRIG 77 03/28/2018 1032   HDL 64 03/28/2018 1032   LDLCALC 86 03/28/2018 1032   Hepatic Function Panel     Component Value Date/Time   PROT 7.2 08/01/2018 1303   ALBUMIN 4.4 08/01/2018 1303   AST 16 08/01/2018 1303   ALT 19 08/01/2018 1303   ALKPHOS 108 08/01/2018 1303   BILITOT 0.5 08/01/2018 1303   BILIDIR 0.1 08/09/2012 0845   IBILI 0.5 08/09/2012 0845      Component Value Date/Time   TSH 2.310 03/28/2018 1032   TSH 2.520 10/01/2015 1003   Results for TELMA, CHRIST (MRN [id number]) as of 08/27/2018 14:31  Ref. Range 08/01/2018 13:03  Vitamin D, 25-Hydroxy Latest Ref Range: 30.0 - 100.0 ng/mL 52.5    OBESITY BEHAVIORAL INTERVENTION VISIT  Today's visit was # 9  Starting weight: 273 lbs Starting date: 03/28/18 Today's weight : Weight: 262 lb (118.8 kg)  Today's date: 08/27/2018 Total lbs lost to date: 11 At least 15 minutes were spent on discussing the following behavioral intervention visit.   08/27/2018  Height  (1.727 m)  Weight 262 lb (118.8 kg)  BMI (Calculated) 39.85  BLOOD PRESSURE - SYSTOLIC 122  BLOOD PRESSURE - DIASTOLIC 71   Body Fat % 48.6 %  Total Body Water (lbs) 92.8 lbs    ASK: We discussed the diagnosis of obesity with Tennis Must Aiello today and Murl agreed to give [place] permission to discuss obesity behavioral modification therapy  today.  ASSESS: Modean has the diagnosis of obesity and her BMI today is 39.85. Myla is in the action stage of change.   ADVISE: Jamariyah was educated on the multiple health risks of obesity as well as the benefit of weight loss to improve her health. She was advised of the need for long term treatment and the importance of lifestyle modifications to improve her current health and to decrease her risk of future health problems.  AGREE: Multiple dietary modification options and treatment options were discussed and Khadijah agreed to follow the recommendations documented in the above note.  ARRANGE: Jaylei was educated on the importance of frequent visits to treat obesity as outlined per CMS and USPSTF guidelines and agreed to schedule her next follow up appointment today.  IKirke Corin, CMA, am acting as transcriptionist for Wilder Glade, MD  I have reviewed the above documentation for accuracy and completeness, and I agree with the above. -Quillian Quince, MD

## 2018-08-27 NOTE — Progress Notes (Signed)
Office: [phone]  /  Fax: [phone]    Date: August 27, 2018   Time Seen: 8:35am Duration: 35 minutes Provider: Lawerance Cruel, Psy.D. Type of Session: Individual Therapy  Type of Contact: Face-to-face  Session Content: Jasmine Contreras is a 68 y.o. female presenting for a follow-up appointment to address the previously established treatment goal of decreasing emotional eating. The session was initiated with the administration of the PHQ-9 and GAD-7, as well as a brief check-in. Jasmine Contreras shared she is "partially moved-in" to her new home. She shared about ongoing stressors related to the moving process, and noted an increase in anxiety. Regarding pleasurable activities, [name] shared she is listening to an audio book, attending a book club, and sitting on the porch. She also shared she is engaging in deep breathing. Moreover, Jasmine Contreras shared eating congruent to the structured meal plan is "not going well." Nevertheless, she discussed engaging in planning to help with snacks while moving. Jasmine Contreras also shared she does not believe following a structured meal plan at this time is not "ideal" for her. Thus, it was recommended she discuss the aforementioned with Dr. Dalbert Garnet during their appointment today; she agreed. Despite the aforementioned, Jasmine Contreras noted a decrease in emotional eating, and indicated making better choices during meals. For instance, she discussed avoiding bread and eating more vegetables. Regarding emotional eating, Jasmine Contreras noted one occasion of emotional eating resulting in her consuming half a large bag of chips due to being overwhelmed with the move. During today's appointment, psychoeducation regarding chain analysis to help increase further awareness regarding emotional eating was provided. Jasmine Contreras was also led through a chain analysis using the recent episode of emotional eating to also highlight opportunities to engage in learned skills to help cope. Jasmine Contreras was  receptive to the exercise as evidenced by her stating, "I had more control than I thought in that situation." She was provided handouts for chain analysis. Analize was receptive to today's session as evidenced by openness to sharing, responsiveness to feedback, and engagement in the chain analysis exercise.  Mental Status Examination: Jasmine Contreras arrived on time for the appointment; however, there was a delay in the check-in process. She presented as appropriately dressed and groomed. Jasmine Contreras appeared her stated age and demonstrated adequate orientation to time, place, person, and purpose of the appointment. She also demonstrated appropriate eye contact. No psychomotor abnormalities or behavioral peculiarities noted. Her mood was euthymic with congruent affect. Her thought processes were logical, linear, and goal-directed. No hallucinations, delusions, bizarre thinking or behavior reported or observed. Judgment, insight, and impulse control appeared to be grossly intact. There was no evidence of paraphasias (i.e., errors in speech, gross mispronunciations, and word substitutions), repetition deficits, or disturbances in volume or prosody (i.e., rhythm and intonation). There was no evidence of attention or memory impairments. Jasmine Contreras denied current suicidal and homicidal ideation, plan and intent.   Structured Assessment Results: The Patient Health Questionnaire-9 (PHQ-9) is a self-report measure that assesses symptoms and severity of depression over the course of the last two weeks. Jasmine Contreras obtained a score of 4 suggesting minimal depression. Jasmine Contreras finds the endorsed symptoms to be somewhat difficult. Depression screen [facility] 2/9 08/27/2018  Decreased Interest 1  Down, Depressed, Hopeless 0  PHQ - 2 Score 1  Altered sleeping 1  Tired, decreased energy 1  Change in appetite 0  Feeling bad or failure about yourself  1  Trouble concentrating 0  Moving slowly or fidgety/restless 0  Suicidal thoughts  0  PHQ-9 Score 4  Difficult doing work/chores -  The Generalized Anxiety Disorder-7 (GAD-7) is a brief self-report measure that assesses symptoms of anxiety over the course of the last two weeks. Jasmine Contreras obtained a score of 5 suggesting mild anxiety. GAD 7 : Generalized Anxiety Score 08/27/2018  Nervous, Anxious, on Edge 0  Control/stop worrying 1  Worry too much - different things 1  Trouble relaxing 1  Restless 0  Easily annoyed or irritable 1  Afraid - awful might happen 1  Total GAD 7 Score 5  Anxiety Difficulty Somewhat difficult   Interventions:  Administration of PHQ-9 and GAD-7 for symptom monitoring Review of content from the previous session Empathic reflections and validation Positive reinforcement Brief chart review  Psychoeducation regarding chain analysis Chain analysis exercise  DSM-5 Diagnosis: 311 (F32.8) Other Specified Depressive Disorder, Emotional Eating Behaviors  Treatment Goal & Progress: During the initial appointment with this provider, the following treatment goal was established: decrease emotional eating. Jasmine Contreras has demonstrated progress in her goal as evidenced by increased awareness of hunger patterns. While she continues to deviate from the prescribed structured meal plan due to current circumstances, Jasmine Contreras discussed making better choices. She also reported a reduction in emotional eating during today's appointment.   Plan: Jasmine Contreras continues to appear able and willing to participate as evidenced by engagement in reciprocal conversation, and asking questions for clarification as appropriate. The next appointment will be scheduled in two weeks. The next session will focus on reviewing learned skills, and possible termination.

## 2018-08-28 DIAGNOSIS — H9113 Presbycusis, bilateral: Secondary | ICD-10-CM | POA: Diagnosis not present

## 2018-08-28 DIAGNOSIS — H9311 Tinnitus, right ear: Secondary | ICD-10-CM | POA: Diagnosis not present

## 2018-08-28 DIAGNOSIS — H9042 Sensorineural hearing loss, unilateral, left ear, with unrestricted hearing on the contralateral side: Secondary | ICD-10-CM | POA: Diagnosis not present

## 2018-08-28 DIAGNOSIS — H9313 Tinnitus, bilateral: Secondary | ICD-10-CM | POA: Diagnosis not present

## 2018-08-28 DIAGNOSIS — R42 Dizziness and giddiness: Secondary | ICD-10-CM | POA: Diagnosis not present

## 2018-09-10 NOTE — Progress Notes (Unsigned)
  Office: [phone]  /  Fax: [phone]    Date: 09/11/2018   Time Seen:*** Duration:*** Provider: Lawerance Cruel, Psy.D. Type of Session: Individual Therapy  Type of Contact: Face-to-face  Session Content: Jasmine Contreras is a 68 y.o. female presenting for a follow-up appointment to address the previously established treatment goal of decreasing emotional eating as well as termination of services with this provider. The session was initiated with the administration of the PHQ-9 and GAD-7, as well as a brief check-in. *** Jasmine Contreras was receptive to today's session as evidenced by openness to sharing, responsiveness to feedback, and ***.  Mental Status Examination: Jasmine Contreras arrived on time for the appointment. She presented as appropriately dressed and groomed. Jasmine Contreras appeared her stated age and demonstrated adequate orientation to time, place, person, and purpose of the appointment. She also demonstrated appropriate eye contact. No psychomotor abnormalities or behavioral peculiarities noted. Her mood was {gbmood:21757} with congruent affect. Her thought processes were logical, linear, and goal-directed. No hallucinations, delusions, bizarre thinking or behavior reported or observed. Judgment, insight, and impulse control appeared to be grossly intact. There was no evidence of paraphasias (i.e., errors in speech, gross mispronunciations, and word substitutions), repetition deficits, or disturbances in volume or prosody (i.e., rhythm and intonation). There was no evidence of attention or memory impairments. Jasmine Contreras denied current suicidal and homicidal ideation, plan and intent.   Structured Assessment Results: The Patient Health Questionnaire-9 (PHQ-9) is a self-report measure that assesses symptoms and severity of depression over the course of the last two weeks. Jasmine Contreras obtained a score of *** suggesting {GBPHQ9SEVERITY:21752}. Jasmine Contreras finds the endorsed symptoms to be  {gbphq9difficulty:21754}.  The Generalized Anxiety Disorder-7 (GAD-7) is a brief self-report measure that assesses symptoms of anxiety over the course of the last two weeks. Jasmine Contreras obtained a score of *** suggesting {gbgad7severity:21753}.  Interventions:  {Interventions:22172}  DSM-5 Diagnosis: 311 (F32.8) Other Specified Depressive Disorder, Emotional Eating Behaviors  Treatment Goal & Progress: During the initial appointment with this provider, the following treatment goal was established: decrease emotional eating. Jasmine Contreras has demonstrated progress in her goal as evidenced by   Plan: Jasmine Contreras continues to appear able and willing to participate as evidenced by engagement in reciprocal conversation, and asking questions for clarification as appropriate.*** The next appointment will be scheduled in {gbweeks:21758}. The next session will focus on reviewing learned skills, and working towards the established treatment goal.***

## 2018-09-11 ENCOUNTER — Encounter (INDEPENDENT_AMBULATORY_CARE_PROVIDER_SITE_OTHER): Payer: Self-pay

## 2018-09-11 ENCOUNTER — Ambulatory Visit (INDEPENDENT_AMBULATORY_CARE_PROVIDER_SITE_OTHER): Payer: Medicare Other | Admitting: Psychology

## 2018-09-11 ENCOUNTER — Ambulatory Visit (INDEPENDENT_AMBULATORY_CARE_PROVIDER_SITE_OTHER): Payer: Self-pay | Admitting: Dietician

## 2018-09-12 ENCOUNTER — Other Ambulatory Visit: Payer: Self-pay

## 2018-09-12 ENCOUNTER — Ambulatory Visit (INDEPENDENT_AMBULATORY_CARE_PROVIDER_SITE_OTHER): Payer: BLUE CROSS/BLUE SHIELD | Admitting: Psychology

## 2018-09-12 DIAGNOSIS — F3289 Other specified depressive episodes: Secondary | ICD-10-CM

## 2018-09-12 NOTE — Progress Notes (Signed)
Office: [phone]  /  Fax: [phone]    Date: September 12, 2018  Time Seen: 9:00am Duration: 30 minutes Provider: Lawerance Cruel, Psy.D. Type of Session: Individual Therapy  Type of Contact: Face-to-face  Session Content: Jasmine Contreras is a 68 y.o. female presenting for a follow-up appointment to address the previously established treatment goal of decreasing emotional eating as well as termination of services with this provider. The session was initiated with the administration of the PHQ-9 and GAD-7, as well as a brief check-in. Jasmine Contreras shared ongoing stressors with moving into her new home. She also shared her maternal aunts are sick, and Jasmine Contreras discussed "battling with faith" regarding whether she should go visit them and risk getting sick herself. Additionally, she discussed the worry about her aunts' well-being has "dredged up" feelings about when her mother got sick. Associated thoughts and feelings were processed. Due to ongoing stressors, it was recommended a referral be placed for longer-term therapeutic services. Jasmine Contreras agreed.   Regarding eating, Jasmine Contreras noted, "I'm still trying to get the vegetables in and I'm going to a regiment of eating an apple a day." She also discussed it has been difficult to follow the structured meal plan, and acknowledged it is because she is "too busy taking care of others." She discussed two to three days of emotional eating. Jasmine Contreras explained, "It was all day long. It was snacking." She added, "It was mostly sweets." She also discussed her husband bringing certain foods home, specifically fast foods. As such, psychoeducation regarding levels of support was provided, and a handout was given to Jasmine Contreras. Jasmine Contreras was receptive to today's session as evidenced by openness to sharing, responsiveness to feedback, and willingness to initiate longer-term therapeutic services.  Mental Status Examination: Jasmine Contreras arrived on time for the appointment. She  presented as appropriately dressed and groomed. Jasmine Contreras appeared her stated age and demonstrated adequate orientation to time, place, person, and purpose of the appointment. She also demonstrated appropriate eye contact. No psychomotor abnormalities or behavioral peculiarities noted. Her mood was euthymic with congruent affect. Her thought processes were logical, linear, and goal-directed. No hallucinations, delusions, bizarre thinking or behavior reported or observed. Judgment, insight, and impulse control appeared to be grossly intact. There was no evidence of paraphasias (i.e., errors in speech, gross mispronunciations, and word substitutions), repetition deficits, or disturbances in volume or prosody (i.e., rhythm and intonation). There was no evidence of attention or memory impairments. Jasmine Contreras denied current suicidal and homicidal ideation, plan and intent.   Structured Assessment Results: The Patient Health Questionnaire-9 (PHQ-9) is a self-report measure that assesses symptoms and severity of depression over the course of the last two weeks. Jasmine Contreras obtained a score of 4 suggesting minimal depression. Jasmine Contreras finds the endorsed symptoms to be somewhat difficult. Depression screen PHQ 2/9 09/12/2018  Decreased Interest 1  Down, Depressed, Hopeless 0  PHQ - 2 Score 1  Altered sleeping 1  Tired, decreased energy 1  Change in appetite 0  Feeling bad or failure about yourself  0  Trouble concentrating 1  Moving slowly or fidgety/restless 0  Suicidal thoughts 0  PHQ-9 Score 4  Difficult doing work/chores -   The Generalized Anxiety Disorder-7 (GAD-7) is a brief self-report measure that assesses symptoms of anxiety over the course of the last two weeks. Jasmine Contreras obtained a score of 4 suggesting minimal anxiety. GAD 7 : Generalized Anxiety Score 09/12/2018  Nervous, Anxious, on Edge 0  Control/stop worrying 1  Worry too much - different things 1  Trouble relaxing 1  Restless 0  Easily  annoyed or irritable 0  Afraid - awful might happen 1  Total GAD 7 Score 4  Anxiety Difficulty Somewhat difficult   Interventions:  Administration of PHQ-9 and GAD-7 for symptom monitoring Empathic reflections and validation Processing thoughts and feelings Psychoeducation regarding levels of support Discussed option for a referral for longer-term therapeutic services Brief chart review  DSM-5 Diagnosis: 311 (F32.8) Other Specified Depressive Disorder, Emotional Eating Behaviors  Treatment Goal & Progress: During the initial appointment with this provider, the following treatment goal was established: decrease emotional eating. Jasmine Contreras has demonstrated some progress in her goal as evidenced by increased awareness of hunger patterns and triggers for emotional eating. Nevertheless, she acknowledged she continues to engage in emotional eating secondary to ongoing stressors.   Plan: Today was Jasmine Contreras's last appointment with this provider. A referral will be placed for longer-term therapeutic services to focus on ongoing stressors as well as emotional eating.

## 2018-09-18 ENCOUNTER — Encounter (INDEPENDENT_AMBULATORY_CARE_PROVIDER_SITE_OTHER): Payer: Self-pay

## 2018-09-20 ENCOUNTER — Ambulatory Visit (INDEPENDENT_AMBULATORY_CARE_PROVIDER_SITE_OTHER): Payer: Self-pay | Admitting: Dietician

## 2018-09-24 ENCOUNTER — Ambulatory Visit (INDEPENDENT_AMBULATORY_CARE_PROVIDER_SITE_OTHER): Payer: Self-pay | Admitting: Family Medicine

## 2018-09-24 ENCOUNTER — Encounter (INDEPENDENT_AMBULATORY_CARE_PROVIDER_SITE_OTHER): Payer: Self-pay

## 2018-09-26 ENCOUNTER — Encounter (INDEPENDENT_AMBULATORY_CARE_PROVIDER_SITE_OTHER): Payer: Self-pay

## 2018-11-14 ENCOUNTER — Ambulatory Visit: Payer: Medicare Other | Admitting: Psychology

## 2018-11-15 ENCOUNTER — Emergency Department (HOSPITAL_COMMUNITY): Payer: Medicare Other

## 2018-11-15 ENCOUNTER — Inpatient Hospital Stay (HOSPITAL_COMMUNITY)
Admission: EM | Admit: 2018-11-15 | Discharge: 2018-11-17 | DRG: 442 | Disposition: A | Discharge status: Home / Self Care | Payer: Medicare Other | Attending: Internal Medicine | Admitting: Internal Medicine

## 2018-11-15 ENCOUNTER — Encounter (HOSPITAL_COMMUNITY): Payer: Self-pay | Admitting: Emergency Medicine

## 2018-11-15 ENCOUNTER — Other Ambulatory Visit: Payer: Self-pay

## 2018-11-15 DIAGNOSIS — E876 Hypokalemia: Secondary | ICD-10-CM | POA: Diagnosis not present

## 2018-11-15 DIAGNOSIS — I341 Nonrheumatic mitral (valve) prolapse: Secondary | ICD-10-CM | POA: Diagnosis present

## 2018-11-15 DIAGNOSIS — K7689 Other specified diseases of liver: Principal | ICD-10-CM | POA: Diagnosis present

## 2018-11-15 DIAGNOSIS — Z90710 Acquired absence of both cervix and uterus: Secondary | ICD-10-CM

## 2018-11-15 DIAGNOSIS — Z8673 Personal history of transient ischemic attack (TIA), and cerebral infarction without residual deficits: Secondary | ICD-10-CM

## 2018-11-15 DIAGNOSIS — R0602 Shortness of breath: Secondary | ICD-10-CM | POA: Diagnosis not present

## 2018-11-15 DIAGNOSIS — Z20828 Contact with and (suspected) exposure to other viral communicable diseases: Secondary | ICD-10-CM | POA: Diagnosis not present

## 2018-11-15 DIAGNOSIS — K279 Peptic ulcer, site unspecified, unspecified as acute or chronic, without hemorrhage or perforation: Secondary | ICD-10-CM | POA: Diagnosis present

## 2018-11-15 DIAGNOSIS — Z903 Acquired absence of stomach [part of]: Secondary | ICD-10-CM

## 2018-11-15 DIAGNOSIS — I5032 Chronic diastolic (congestive) heart failure: Secondary | ICD-10-CM | POA: Diagnosis present

## 2018-11-15 DIAGNOSIS — R7303 Prediabetes: Secondary | ICD-10-CM | POA: Diagnosis present

## 2018-11-15 DIAGNOSIS — R1011 Right upper quadrant pain: Secondary | ICD-10-CM

## 2018-11-15 DIAGNOSIS — Z885 Allergy status to narcotic agent status: Secondary | ICD-10-CM

## 2018-11-15 DIAGNOSIS — E669 Obesity, unspecified: Secondary | ICD-10-CM

## 2018-11-15 DIAGNOSIS — Z7984 Long term (current) use of oral hypoglycemic drugs: Secondary | ICD-10-CM

## 2018-11-15 DIAGNOSIS — I110 Hypertensive heart disease with heart failure: Secondary | ICD-10-CM | POA: Diagnosis present

## 2018-11-15 DIAGNOSIS — Z79899 Other long term (current) drug therapy: Secondary | ICD-10-CM

## 2018-11-15 DIAGNOSIS — R079 Chest pain, unspecified: Secondary | ICD-10-CM | POA: Diagnosis not present

## 2018-11-15 DIAGNOSIS — K219 Gastro-esophageal reflux disease without esophagitis: Secondary | ICD-10-CM | POA: Diagnosis present

## 2018-11-15 DIAGNOSIS — R109 Unspecified abdominal pain: Secondary | ICD-10-CM | POA: Diagnosis not present

## 2018-11-15 DIAGNOSIS — Z9049 Acquired absence of other specified parts of digestive tract: Secondary | ICD-10-CM

## 2018-11-15 DIAGNOSIS — Z888 Allergy status to other drugs, medicaments and biological substances status: Secondary | ICD-10-CM

## 2018-11-15 DIAGNOSIS — K750 Abscess of liver: Secondary | ICD-10-CM

## 2018-11-15 DIAGNOSIS — M109 Gout, unspecified: Secondary | ICD-10-CM | POA: Diagnosis present

## 2018-11-15 DIAGNOSIS — Z6839 Body mass index (BMI) 39.0-39.9, adult: Secondary | ICD-10-CM

## 2018-11-15 LAB — CBC WITH DIFFERENTIAL/PLATELET
Abs Immature Granulocytes: 0.02 10*3/uL (ref 0.00–0.07)
Basophils Absolute: 0 10*3/uL (ref 0.0–0.1)
Basophils Relative: 0 %
Eosinophils Absolute: 0.2 10*3/uL (ref 0.0–0.5)
Eosinophils Relative: 2 %
HCT: 38.7 % (ref 36.0–46.0)
Hemoglobin: 12.2 g/dL (ref 12.0–15.0)
Immature Granulocytes: 0 %
Lymphocytes Relative: 33 %
Lymphs Abs: 2.5 10*3/uL (ref 0.7–4.0)
MCH: 25.1 pg — ABNORMAL LOW (ref 26.0–34.0)
MCHC: 31.5 g/dL (ref 30.0–36.0)
MCV: 79.5 fL — ABNORMAL LOW (ref 80.0–100.0)
Monocytes Absolute: 0.5 10*3/uL (ref 0.1–1.0)
Monocytes Relative: 7 %
Neutro Abs: 4.3 10*3/uL (ref 1.7–7.7)
Neutrophils Relative %: 58 %
Platelets: 278 10*3/uL (ref 150–400)
RBC: 4.87 MIL/uL (ref 3.87–5.11)
RDW: 16.4 % — ABNORMAL HIGH (ref 11.5–15.5)
WBC: 7.4 10*3/uL (ref 4.0–10.5)
nRBC: 0 % (ref 0.0–0.2)

## 2018-11-15 LAB — COMPREHENSIVE METABOLIC PANEL
ALT: 19 U/L (ref 0–44)
AST: 23 U/L (ref 15–41)
Albumin: 3.9 g/dL (ref 3.5–5.0)
Alkaline Phosphatase: 100 U/L (ref 38–126)
Anion gap: 14 (ref 5–15)
BUN: 10 mg/dL (ref 8–23)
CO2: 25 mmol/L (ref 22–32)
Calcium: 9.5 mg/dL (ref 8.9–10.3)
Chloride: 102 mmol/L (ref 98–111)
Creatinine, Ser: 0.76 mg/dL (ref 0.44–1.00)
GFR calc Af Amer: >60 mL/min (ref >60)
GFR calc non Af Amer: >60 mL/min (ref >60)
Glucose, Bld: 93 mg/dL (ref 70–99)
Potassium: 3.3 mmol/L — ABNORMAL LOW (ref 3.5–5.1)
Sodium: 141 mmol/L (ref 135–145)
Total Bilirubin: 1.1 mg/dL (ref 0.3–1.2)
Total Protein: 8.2 g/dL — ABNORMAL HIGH (ref 6.5–8.1)

## 2018-11-15 LAB — URINALYSIS, ROUTINE W REFLEX MICROSCOPIC
Bacteria, UA: NONE SEEN
Bilirubin Urine: NEGATIVE
Glucose, UA: NEGATIVE mg/dL
Ketones, ur: NEGATIVE mg/dL
Leukocytes,Ua: NEGATIVE
Nitrite: NEGATIVE
Protein, ur: NEGATIVE mg/dL
Specific Gravity, Urine: 1.004 — ABNORMAL LOW (ref 1.005–1.030)
pH: 6 (ref 5.0–8.0)

## 2018-11-15 LAB — GLUCOSE, CAPILLARY: Glucose-Capillary: 107 mg/dL — ABNORMAL HIGH (ref 70–99)

## 2018-11-15 LAB — SARS CORONAVIRUS 2 BY RT PCR (HOSPITAL ORDER, PERFORMED IN ~~LOC~~ HOSPITAL LAB): SARS Coronavirus 2: NEGATIVE

## 2018-11-15 LAB — I-STAT CREATININE, ED: Creatinine, Ser: 0.6 mg/dL (ref 0.44–1.00)

## 2018-11-15 LAB — TROPONIN I: Troponin I: <0.03 ng/mL (ref <0.03)

## 2018-11-15 LAB — LIPASE, BLOOD: Lipase: 25 U/L (ref 11–51)

## 2018-11-15 MED ORDER — POTASSIUM CHLORIDE CRYS ER 20 MEQ PO TBCR
40.0000 meq | EXTENDED_RELEASE_TABLET | Freq: Once | ORAL | Status: AC
  Administered 2018-11-15: 40 meq via ORAL
  Filled 2018-11-15: qty 2

## 2018-11-15 MED ORDER — METOPROLOL SUCCINATE ER 25 MG PO TB24
25.0000 mg | ORAL_TABLET | Freq: Every day | ORAL | Status: DC
  Administered 2018-11-15 – 2018-11-16 (×2): 25 mg via ORAL
  Filled 2018-11-15 (×2): qty 1

## 2018-11-15 MED ORDER — FENTANYL CITRATE (PF) 100 MCG/2ML IJ SOLN
50.0000 ug | Freq: Once | INTRAMUSCULAR | Status: AC
  Administered 2018-11-15: 50 ug via INTRAVENOUS
  Filled 2018-11-15: qty 2

## 2018-11-15 MED ORDER — SODIUM CHLORIDE 0.9 % IV BOLUS
500.0000 mL | Freq: Once | INTRAVENOUS | Status: AC
  Administered 2018-11-15: 500 mL via INTRAVENOUS

## 2018-11-15 MED ORDER — IOHEXOL 350 MG/ML SOLN
100.0000 mL | Freq: Once | INTRAVENOUS | Status: AC | PRN
  Administered 2018-11-15: 100 mL via INTRAVENOUS

## 2018-11-15 MED ORDER — INSULIN ASPART 100 UNIT/ML ~~LOC~~ SOLN: 0.0000 [IU] | Freq: Three times a day (TID) | SUBCUTANEOUS | Status: DC

## 2018-11-15 MED ORDER — TORSEMIDE 20 MG PO TABS
30.0000 mg | ORAL_TABLET | Freq: Every day | ORAL | Status: DC
  Administered 2018-11-16 – 2018-11-17 (×2): 30 mg via ORAL
  Filled 2018-11-15 (×3): qty 1

## 2018-11-15 MED ORDER — HYDRALAZINE HCL 20 MG/ML IJ SOLN: 10.0000 mg | Freq: Four times a day (QID) | INTRAMUSCULAR | Status: DC | PRN

## 2018-11-15 MED ORDER — FENTANYL CITRATE (PF) 100 MCG/2ML IJ SOLN
25.0000 ug | INTRAMUSCULAR | Status: DC | PRN
  Administered 2018-11-15 – 2018-11-17 (×3): 25 ug via INTRAVENOUS
  Filled 2018-11-15 (×5): qty 2

## 2018-11-15 MED ORDER — FENTANYL CITRATE (PF) 100 MCG/2ML IJ SOLN: 25.0000 ug | Freq: Four times a day (QID) | INTRAMUSCULAR | Status: DC | PRN

## 2018-11-15 MED ORDER — PANTOPRAZOLE SODIUM 40 MG PO TBEC
40.0000 mg | DELAYED_RELEASE_TABLET | Freq: Two times a day (BID) | ORAL | Status: DC
  Administered 2018-11-16 – 2018-11-17 (×2): 40 mg via ORAL
  Filled 2018-11-15 (×3): qty 1

## 2018-11-15 MED ORDER — ONDANSETRON HCL 4 MG/2ML IJ SOLN
4.0000 mg | Freq: Four times a day (QID) | INTRAMUSCULAR | Status: DC | PRN
  Administered 2018-11-16: 4 mg via INTRAVENOUS
  Filled 2018-11-15: qty 2

## 2018-11-15 MED ORDER — INSULIN ASPART 100 UNIT/ML ~~LOC~~ SOLN: 0.0000 [IU] | Freq: Every day | SUBCUTANEOUS | Status: DC

## 2018-11-15 MED ORDER — IBUPROFEN 200 MG PO TABS
200.0000 mg | ORAL_TABLET | Freq: Four times a day (QID) | ORAL | Status: DC | PRN
  Administered 2018-11-15: 23:00:00 200 mg via ORAL
  Filled 2018-11-15 (×3): qty 1

## 2018-11-15 NOTE — ED Notes (Signed)
ED TO INPATIENT HANDOFF REPORT  ED Nurse Name and Phone #: Marcelina Morel [phone]  S Name/Age/Gender Jasmine Contreras 68 y.o. female Room/Bed: 042C/042C  Code Status   Code Status: Not on file  Home/SNF/Other Home Patient oriented to: self, place, time and situation Is this baseline? Yes   Triage Complete: Triage complete  Chief Complaint flank pain  Triage Note Pt sent from Dr. Isidore Moos for pain on the rt side that radiates towards her back pain since Sunday and some SOB since last night. Pt rates pain a 7.   Allergies Allergies  Allergen Reactions  . Peanuts [Peanut Oil] Hives  . Abilify [Aripiprazole]     somnelence  . Antivert [Meclizine Hcl] Nausea Only  . Codeine Itching and Nausea And Vomiting  . Honey Bee Treatment [Bee Venom]   . Hydrocodone Itching and Nausea And Vomiting  . Loratadine   . Oxycodone Hcl Itching and Nausea And Vomiting  . Prednisone     Red face  . Scopace [Scopolamine] Nausea Only  . Sertraline     Suicidal thoughts  . Shellfish Allergy Itching  . Tavist-D [Albertsons Dayhist-D]     unknown  . Ultram [Tramadol] Nausea Only    Level of Care/Admitting Diagnosis ED Disposition    ED Disposition Condition Comment   Admit  Hospital Area: [facility] [[id number]]  Level of Care: Med-Surg [16]  I expect the patient will be discharged within 24 hours: Yes  LOW acuity---Tx typically complete <24 hrs---ACUTE conditions typically can be evaluated <24 hours---LABS likely to return to acceptable levels <24 hours---IS near functional baseline---EXPECTED to return to current living arrangement---NOT newly hypoxic: Meets criteria for 5C-Observation unit  Covid Evaluation: Screening Protocol (No Symptoms)  Diagnosis: Liver cyst [086578]  Admitting Physician: Darlin Drop [[id number]]  Attending Physician: Darlin Drop [[id number]]  PT Class (Do Not Modify): Observation [104]  PT Acc Code (Do Not Modify): Observation [10022]        B Medical/Surgery History Past Medical History:  Diagnosis Date  . Acid reflux   . Anxiety   . Arthritis    trochanteric bursitis  . Back pain   . Chest pain   . Cough   . Depression   . Diarrhea   . Difficulty swallowing   . Dizziness   . Double vision   . Dry mouth   . Fatigue   . GERD (gastroesophageal reflux disease)   . Gout   . Headache   . Heart murmur   . Herpes   . Hiatal hernia   . Irritable bowel syndrome   . Itching   . Joint pain   . MVP (mitral valve prolapse)   . Neck stiffness   . Palpitations   . PUD (peptic ulcer disease)    s/p vagotomy  . Retaining fluid   . Ringing in ears   . Shortness of breath   . Stress   . Swelling of extremity   . Swelling of finger   . TIA (transient ischemic attack)   . Varicose veins   . Vertigo   . Vision changes   . Vitamin D deficiency   . Wheezing    Past Surgical History:  Procedure Laterality Date  . BREAST EXCISIONAL BIOPSY Left   . CHOLECYSTECTOMY    . FOOT SURGERY    . partial gastrectomy with vagotomy    . right arthroscopic knee surgery    . trochanteric bursectomy    . VAGINAL HYSTERECTOMY  with oophorectomy     A IV Location/Drains/Wounds Patient Lines/Drains/Airways Status   Active Line/Drains/Airways    Name:   Placement date:   Placement time:   Site:   Days:   Peripheral IV 11/15/18 Right Hand   11/15/18    1304    Hand   less than 1   Peripheral IV 11/15/18 Right Antecubital   11/15/18    1333    Antecubital   less than 1          Intake/Output Last 24 hours No intake or output data in the 24 hours ending 11/15/18 1733  Labs/Imaging Results for orders placed or performed during the hospital encounter of 11/15/18 (from the past 48 hour(s))  Urinalysis, Routine w reflex microscopic     Status: Abnormal   Collection Time: 11/15/18  1:02 PM  Result Value Ref Range   Color, Urine STRAW (A) YELLOW   APPearance CLEAR CLEAR   Specific Gravity, Urine 1.004 (L) 1.005 - 1.030    pH 6.0 5.0 - 8.0   Glucose, UA NEGATIVE NEGATIVE mg/dL   Hgb urine dipstick SMALL (A) NEGATIVE   Bilirubin Urine NEGATIVE NEGATIVE   Ketones, ur NEGATIVE NEGATIVE mg/dL   Protein, ur NEGATIVE NEGATIVE mg/dL   Nitrite NEGATIVE NEGATIVE   Leukocytes,Ua NEGATIVE NEGATIVE   RBC / HPF 0-5 0 - 5 RBC/hpf   WBC, UA 0-5 0 - 5 WBC/hpf   Bacteria, UA NONE SEEN NONE SEEN   Squamous Epithelial / LPF 6-10 0 - 5    Comment: Performed at [facility], [address] [place], [place] [address]  CBC with Differential     Status: Abnormal   Collection Time: 11/15/18  1:12 PM  Result Value Ref Range   WBC 7.4 4.0 - 10.5 K/uL   RBC 4.87 3.87 - 5.11 MIL/uL   Hemoglobin 12.2 12.0 - 15.0 g/dL   HCT 60.4 54.0 - 98.1 %   MCV 79.5 (L) 80.0 - 100.0 fL   MCH 25.1 (L) 26.0 - 34.0 pg   MCHC 31.5 30.0 - 36.0 g/dL   RDW 19.1 (H) 47.8 - 29.5 %   Platelets 278 150 - 400 K/uL   nRBC 0.0 0.0 - 0.2 %   Neutrophils Relative % 58 %   Neutro Abs 4.3 1.7 - 7.7 K/uL   Lymphocytes Relative 33 %   Lymphs Abs 2.5 0.7 - 4.0 K/uL   Monocytes Relative 7 %   Monocytes Absolute 0.5 0.1 - 1.0 K/uL   Eosinophils Relative 2 %   Eosinophils Absolute 0.2 0.0 - 0.5 K/uL   Basophils Relative 0 %   Basophils Absolute 0.0 0.0 - 0.1 K/uL   Immature Granulocytes 0 %   Abs Immature Granulocytes 0.02 0.00 - 0.07 K/uL    Comment: Performed at [facility], [address], [place], [place] [address]  Comprehensive metabolic panel     Status: Abnormal   Collection Time: 11/15/18  1:12 PM  Result Value Ref Range   Sodium 141 135 - 145 mmol/L   Potassium 3.3 (L) 3.5 - 5.1 mmol/L   Chloride 102 98 - 111 mmol/L   CO2 25 22 - 32 mmol/L   Glucose, Bld 93 70 - 99 mg/dL   BUN 10 8 - 23 mg/dL   Creatinine, Ser 8.65 0.44 - 1.00 mg/dL   Calcium 9.5 8.9 - 78.4 mg/dL   Total Protein 8.2 (H) 6.5 - 8.1 g/dL   Albumin 3.9 3.5 - 5.0  g/dL   AST 23 15 - 41 U/L   ALT 19 0 - 44 U/L   Alkaline Phosphatase 100 38 - 126 U/L   Total  Bilirubin 1.1 0.3 - 1.2 mg/dL   GFR calc non Af Amer >60 >60 mL/min   GFR calc Af Amer >60 >60 mL/min   Anion gap 14 5 - 15    Comment: Performed at [facility], [address] [place], [place] [address]  Lipase, blood     Status: None   Collection Time: 11/15/18  1:12 PM  Result Value Ref Range   Lipase 25 11 - 51 U/L    Comment: Performed at [facility], [address] [place], [place] [address]  Troponin I - Once     Status: None   Collection Time: 11/15/18  1:12 PM  Result Value Ref Range   Troponin I <0.03 <0.03 ng/mL    Comment: Performed at [facility], [address] [place], [place] [address]  I-stat Creatinine, ED     Status: None   Collection Time: 11/15/18  1:21 PM  Result Value Ref Range   Creatinine, Ser 0.60 0.44 - 1.00 mg/dL   Dg Chest Port 1 View  Result Date: 11/15/2018 CLINICAL DATA:  Chest pain and shortness of breath EXAM: PORTABLE CHEST 1 VIEW COMPARISON:  09/20/2017. FINDINGS: There is no edema or consolidation. Heart is slightly enlarged with pulmonary vascularity normal. No adenopathy. No bone lesions. No pneumothorax. IMPRESSION: Mild cardiac enlargement.  No edema or consolidation. Electronically Signed   By: Bretta BangWilliam  Woodruff III M.D.   On: 11/15/2018 13:56   Ct Angio Chest/abd/pel For Dissection W And/or W/wo  Result Date: 11/15/2018 CLINICAL DATA:  Right-sided chest and abdominal pain with shortness of breath. EXAM: CT ANGIOGRAPHY CHEST, ABDOMEN AND PELVIS TECHNIQUE: Multidetector CT imaging through the chest, abdomen and pelvis was performed using the standard protocol during bolus administration of intravenous contrast. Multiplanar reconstructed images and MIPs were obtained and reviewed to evaluate the vascular anatomy. CONTRAST:  100mL OMNIPAQUE IOHEXOL 350 MG/ML SOLN COMPARISON:  CTA chest dated August 11, 2012. CT abdomen pelvis dated Nov 03, 2011. FINDINGS: CTA CHEST FINDINGS Cardiovascular: Preferential opacification of the  thoracic aorta. No evidence of thoracic aortic aneurysm or dissection. Unchanged mild cardiomegaly. No pericardial effusion. No pulmonary embolism. Normal caliber pulmonary arteries. Mediastinum/Nodes: No enlarged mediastinal, hilar, or axillary lymph nodes. Thyroid gland, trachea, and esophagus demonstrate no significant findings. Lungs/Pleura: Mild bibasilar atelectasis/scarring. No focal consolidation, pleural effusion, or pneumothorax. No suspicious pulmonary nodule. Musculoskeletal: No chest wall abnormality. No acute or significant osseous findings. Review of the MIP images confirms the above findings. CTA ABDOMEN AND PELVIS FINDINGS VASCULAR Aorta: Normal caliber aorta without aneurysm, dissection, vasculitis or significant stenosis. Celiac: Patent without evidence of aneurysm, dissection, vasculitis or significant stenosis. SMA: Patent without evidence of aneurysm, dissection, vasculitis or significant stenosis. Renals: Both renal arteries are patent without evidence of aneurysm, dissection, vasculitis, fibromuscular dysplasia or significant stenosis. IMA: Patent without evidence of aneurysm, dissection, vasculitis or significant stenosis. Inflow: Patent without evidence of aneurysm, dissection, vasculitis or significant stenosis. Veins: No obvious venous abnormality within the limitations of this arterial phase study. Review of the MIP images confirms the above findings. NON-VASCULAR Hepatobiliary: Innumerable hepatic simple cysts again noted, the majority of which have increased in size when compared to prior studies. The largest cyst measures up to 8.5 cm. Status post cholecystectomy. No biliary dilatation. Pancreas: Unchanged mild atrophy. No pancreatic  ductal dilatation or surrounding inflammatory changes. Spleen: Normal in size without focal abnormality. Adrenals/Urinary Tract: Adrenal glands are unremarkable. Kidneys are normal, without renal calculi, focal lesion, or hydronephrosis. Bladder is  unremarkable. Stomach/Bowel: Unchanged surgical clips near the gastroesophageal junction. Stomach is within normal limits. History of prior appendectomy. No evidence of bowel wall thickening, distention, or inflammatory changes. Diffuse colonic diverticulosis. Lymphatic: No enlarged abdominal or pelvic lymph nodes. Reproductive: Status post hysterectomy. No adnexal masses. Other: Unchanged small fat containing umbilical hernia. No free fluid or pneumoperitoneum. Musculoskeletal: No acute or significant osseous findings. Review of the MIP images confirms the above findings. IMPRESSION: Vascular: 1. No evidence of acute aortic syndrome or thoracoabdominal aneurysm. 2.  No evidence of pulmonary embolism. Chest: 1.  No acute intrathoracic process. Abdomen and pelvis: 1.  No acute intra-abdominal process. Electronically Signed   By: Obie Dredge M.D.   On: 11/15/2018 15:04    Pending Labs Unresulted Labs (From admission, onward)   None      Vitals/Pain Today's Vitals   11/15/18 1530 11/15/18 1600 11/15/18 1645 11/15/18 1700  BP:  (!) 142/78 (!) 146/76 (!) 146/79  Pulse:  68 80 76  Resp:  12 18 17   Temp:      TempSrc:      SpO2:  100% 98% 100%  Weight:      Height:      PainSc: 4        Isolation Precautions No active isolations  Medications Medications  fentaNYL (SUBLIMAZE) injection 50 mcg (50 mcg Intravenous Given 11/15/18 1320)  iohexol (OMNIPAQUE) 350 MG/ML injection 100 mL (100 mLs Intravenous Contrast Given 11/15/18 1436)  sodium chloride 0.9 % bolus 500 mL (500 mLs Intravenous New Bag/Given 11/15/18 1531)  fentaNYL (SUBLIMAZE) injection 50 mcg (50 mcg Intravenous Given 11/15/18 1724)    Mobility walks Low fall risk   Focused Assessments GI/GU   R Recommendations: See Admitting Provider Note  Report given to:   Additional Notes:

## 2018-11-15 NOTE — ED Notes (Signed)
Patient transported to CT 

## 2018-11-15 NOTE — Progress Notes (Signed)
Pt just arrived  To 6n15 from ED

## 2018-11-15 NOTE — H&P (Signed)
History and Physical  Jasmine Contreras NWG:[id number] DOB: 1951-01-17 DOA: 11/15/2018  Referring physician: Dr Jeraldine Loots  PCP: Laurann Montana, MD  Outpatient Specialists: None Patient coming from: Home  Chief Complaint: Severe abdominal pain  HPI: Jasmine Contreras is a 68 y.o. female with medical history significant for hypertension, type 2 diabetes, gout, chronic diastolic CHF, GERD/PUD status post vagotomy, TIA, mitral valve prolapse, post cholecystectomy, who presented to [facility] ED with complaints of severe right upper quadrant abdominal pain of 5 days duration.  Patient states symptoms started on Sunday after eating a hot dog and gradually worsened.  She has been having intermittent sharp right upper quadrant abdominal pain improved with belching worsened by certain position or certain foods.  Her pain radiates into her right shoulder blade.  Denies fevers or chills.  She contacted her primary care provider who recommended that she comes to the ED for further evaluation.  ED Course: On presentation to the ED vital signs and lab studies stable.  CT angios chest/abdomen and pelvis revealed innumerable hepatic cysts the majority of which have increased in size when compared to prior studies.  The largest cyst measures up to 8.5 cm.  No biliary dilatation.  No evidence of pulmonary embolism, no evidence of acute aortic syndrome or thoracoabdominal aneurysm.  ED contacted Dr. Ewing Schlein [facility]) her GI physician who recommended liver cyst drainage by interventional radiology.  TRH called to admit.  Review of Systems: Review of systems as noted in the HPI. All other systems reviewed and are negative.   Past Medical History:  Diagnosis Date  . Acid reflux   . Anxiety   . Arthritis    trochanteric bursitis  . Back pain   . Chest pain   . Cough   . Depression   . Diarrhea   . Difficulty swallowing   . Dizziness   . Double vision   . Dry mouth   . Fatigue   . GERD (gastroesophageal reflux  disease)   . Gout   . Headache   . Heart murmur   . Herpes   . Hiatal hernia   . Irritable bowel syndrome   . Itching   . Joint pain   . MVP (mitral valve prolapse)   . Neck stiffness   . Palpitations   . PUD (peptic ulcer disease)    s/p vagotomy  . Retaining fluid   . Ringing in ears   . Shortness of breath   . Stress   . Swelling of extremity   . Swelling of finger   . TIA (transient ischemic attack)   . Varicose veins   . Vertigo   . Vision changes   . Vitamin D deficiency   . Wheezing    Past Surgical History:  Procedure Laterality Date  . BREAST EXCISIONAL BIOPSY Left   . CHOLECYSTECTOMY    . FOOT SURGERY    . partial gastrectomy with vagotomy    . right arthroscopic knee surgery    . trochanteric bursectomy    . VAGINAL HYSTERECTOMY     with oophorectomy    Social History:  reports that she has never smoked. She has never used smokeless tobacco. She reports current alcohol use. She reports that she does not use drugs.   Allergies  Allergen Reactions  . Honey Bee Treatment [Bee Venom] Anaphylaxis and Swelling    Body swells  . Other Other (See Comments)    Has diverticulitis- cannot have ANY FOODS WITH SEEDS!!  Francee Gentile Products  Anaphylaxis  . Strawberry Extract Anaphylaxis  . Peanuts [Peanut Oil] Hives  . Abilify [Aripiprazole] Other (See Comments)    Somnolence    . Adhesive [Tape] Hives and Itching  . Codeine Itching and Nausea And Vomiting  . Hydrocodone Itching and Nausea And Vomiting  . Latex Hives and Itching  . Meclizine Hcl Nausea Only  . Oxycodone Hcl Itching and Nausea And Vomiting  . Prednisone Other (See Comments) and Hypertension    "Makes my face turn red, also"  . Scopace [Scopolamine] Nausea Only  . Sertraline Other (See Comments)    Suicidal thoughts   . Tavist-D [Albertsons Dayhist-D] Other (See Comments)    Makes my nose drain  . Tramadol Nausea Only    Family History  Problem Relation Age of Onset  .  Diabetes Mother   . Hypertension Mother   . Kidney disease Mother   . Alcohol abuse Father   . Hyperlipidemia Father   . Hypertension Father   . Stroke Father   . Depression Father   . Sleep apnea Father      Prior to Admission medications   Medication Sig Start Date End Date Taking? Authorizing Provider  acetaminophen (TYLENOL) 325 MG tablet Take 325-650 mg by mouth every 6 (six) hours as needed (for pain or headaches).   Yes [provider]  BIOTIN FORTE PO Take 1 capsule by mouth every other day.    Yes [provider]  Cholecalciferol (VITAMIN D3) 250 MCG (10000 UT) TABS Take 20,000 Units by mouth 2 (two) times a day.   Yes [provider]  colchicine 0.6 MG tablet Take 0.6 mg by mouth as needed (as directed for gout flares).    Yes [provider]  EPINEPHrine 0.3 mg/0.3 mL IJ SOAJ injection Inject 0.3 mg into the muscle once as needed for anaphylaxis.    Yes [provider]  ferrous gluconate (IRON 27) 240 (27 FE) MG tablet Take 240 mg by mouth daily.   Yes [provider]  hyoscyamine (LEVSIN SL) 0.125 MG SL tablet Place 0.125 mg under the tongue every 4 (four) hours as needed for cramping (or "stomach spasms").    Yes [provider]  indomethacin (INDOCIN) 50 MG capsule Take 50 mg by mouth 2 (two) times daily as needed (as directed for pain/gout flares).    Yes [provider]  metoprolol succinate (TOPROL-XL) 25 MG 24 hr tablet Take 25 mg by mouth at bedtime.  09/24/15  Yes [provider]  nitroGLYCERIN (NITROSTAT) 0.4 MG SL tablet Place 0.4 mg under the tongue every 5 (five) minutes as needed for chest pain.   Yes [provider]  NON FORMULARY Place 2 drops under the tongue See admin instructions. CBD oil: Place 2 drops under the tongue at bedtime as needed for sleep   Yes [provider]  ofloxacin (FLOXIN) 0.3 % OTIC solution See admin instructions. Instill as directed for  infections   Yes [provider]  pantoprazole (PROTONIX) 40 MG tablet Take 30- 60 min before your first and last meals of the day Patient taking differently: Take 40 mg by mouth See admin instructions. Take 40 mg by mouth 30- 60 minutes before breakfast and supper (evening meal) 01/16/18  Yes Nyoka CowdenWert, Michael B, MD  potassium chloride SA (K-DUR,KLOR-CON) 20 MEQ tablet Take 20 mEq by mouth 2 (two) times daily.    Yes [provider]  scopolamine (TRANSDERM-SCOP) 1 MG/3DAYS Place 1 patch onto the skin every 3 (  three) days as needed (for motion sickness).    Yes [provider]  torsemide (DEMADEX) 20 MG tablet Take 30 mg by mouth daily.   Yes [provider]  vitamin B-12 (CYANOCOBALAMIN) 1000 MCG tablet Take 1,000 mcg by mouth 2 (two) times a week.    Yes [provider]  metFORMIN (GLUCOPHAGE) 500 MG tablet Take 1 tablet (500 mg total) by mouth daily with breakfast. Patient not taking: Reported on 11/15/2018 08/27/18   Wilder Glade, MD    Physical Exam: BP (!) 161/86 (BP Location: Left Arm)   Pulse 71   Temp 98.7 F (37.1 C) (Oral)   Resp 20   Ht  (1.727 m)   Wt 118.8 kg   SpO2 100%   BMI 39.82 kg/m   . General: 68 y.o. year-old female well developed well nourished in no acute distress.  Alert and oriented x3. . Cardiovascular: Regular rate and rhythm with no rubs or gallops.  No thyromegaly or JVD noted.  No lower extremity edema. 2/4 pulses in all 4 extremities. Marland Kitchen Respiratory: Clear to auscultation with no wheezes or rales. Good inspiratory effort. . Abdomen: Soft with right upper quadrant tenderness on palpation.  Vertical scar noted from vagotomy.  Mildly distended with normal bowel sounds x4 quadrants. . Muskuloskeletal: No cyanosis, clubbing or edema noted bilaterally . Neuro: CN II-XII intact, strength, sensation, reflexes . Skin: No ulcerative lesions noted or rashes . Psychiatry: Judgement and insight appear normal. Mood is  appropriate for condition and setting          Labs on Admission:  Basic Metabolic Panel: Recent Labs  Lab 11/15/18 1312 11/15/18 1321  NA 141  --   K 3.3*  --   CL 102  --   CO2 25  --   GLUCOSE 93  --   BUN 10  --   CREATININE 0.76 0.60  CALCIUM 9.5  --    Liver Function Tests: Recent Labs  Lab 11/15/18 1312  AST 23  ALT 19  ALKPHOS 100  BILITOT 1.1  PROT 8.2*  ALBUMIN 3.9   Recent Labs  Lab 11/15/18 1312  LIPASE 25   No results for input(s): AMMONIA in the last 168 hours. CBC: Recent Labs  Lab 11/15/18 1312  WBC 7.4  NEUTROABS 4.3  HGB 12.2  HCT 38.7  MCV 79.5*  PLT 278   Cardiac Enzymes: Recent Labs  Lab 11/15/18 1312  TROPONINI <0.03    BNP (last 3 results) No results for input(s): BNP in the last 8760 hours.  ProBNP (last 3 results) No results for input(s): PROBNP in the last 8760 hours.  CBG: No results for input(s): GLUCAP in the last 168 hours.  Radiological Exams on Admission: Dg Chest Port 1 View  Result Date: 11/15/2018 CLINICAL DATA:  Chest pain and shortness of breath EXAM: PORTABLE CHEST 1 VIEW COMPARISON:  09/20/2017. FINDINGS: There is no edema or consolidation. Heart is slightly enlarged with pulmonary vascularity normal. No adenopathy. No bone lesions. No pneumothorax. IMPRESSION: Mild cardiac enlargement.  No edema or consolidation. Electronically Signed   By: Bretta Bang III M.D.   On: 11/15/2018 13:56   Ct Angio Chest/abd/pel For Dissection W And/or W/wo  Result Date: 11/15/2018 CLINICAL DATA:  Right-sided chest and abdominal pain with shortness of breath. EXAM: CT ANGIOGRAPHY CHEST, ABDOMEN AND PELVIS TECHNIQUE: Multidetector CT imaging through the chest, abdomen and pelvis was performed using the standard protocol during bolus administration of intravenous contrast. Multiplanar reconstructed  images and MIPs were obtained and reviewed to evaluate the vascular anatomy. CONTRAST:  OMNIPAQUE IOHEXOL 350 MG/ML SOLN  COMPARISON:  CTA chest dated August 11, 2012. CT abdomen pelvis dated Nov 03, 2011. FINDINGS: CTA CHEST FINDINGS Cardiovascular: Preferential opacification of the thoracic aorta. No evidence of thoracic aortic aneurysm or dissection. Unchanged mild cardiomegaly. No pericardial effusion. No pulmonary embolism. Normal caliber pulmonary arteries. Mediastinum/Nodes: No enlarged mediastinal, hilar, or axillary lymph nodes. Thyroid gland, trachea, and esophagus demonstrate no significant findings. Lungs/Pleura: Mild bibasilar atelectasis/scarring. No focal consolidation, pleural effusion, or pneumothorax. No suspicious pulmonary nodule. Musculoskeletal: No chest wall abnormality. No acute or significant osseous findings. Review of the MIP images confirms the above findings. CTA ABDOMEN AND PELVIS FINDINGS VASCULAR Aorta: Normal caliber aorta without aneurysm, dissection, vasculitis or significant stenosis. Celiac: Patent without evidence of aneurysm, dissection, vasculitis or significant stenosis. SMA: Patent without evidence of aneurysm, dissection, vasculitis or significant stenosis. Renals: Both renal arteries are patent without evidence of aneurysm, dissection, vasculitis, fibromuscular dysplasia or significant stenosis. IMA: Patent without evidence of aneurysm, dissection, vasculitis or significant stenosis. Inflow: Patent without evidence of aneurysm, dissection, vasculitis or significant stenosis. Veins: No obvious venous abnormality within the limitations of this arterial phase study. Review of the MIP images confirms the above findings. NON-VASCULAR Hepatobiliary: Innumerable hepatic simple cysts again noted, the majority of which have increased in size when compared to prior studies. The largest cyst measures up to 8.5 cm. Status post cholecystectomy. No biliary dilatation. Pancreas: Unchanged mild atrophy. No pancreatic ductal dilatation or surrounding inflammatory changes. Spleen: Normal in size without focal  abnormality. Adrenals/Urinary Tract: Adrenal glands are unremarkable. Kidneys are normal, without renal calculi, focal lesion, or hydronephrosis. Bladder is unremarkable. Stomach/Bowel: Unchanged surgical clips near the gastroesophageal junction. Stomach is within normal limits. History of prior appendectomy. No evidence of bowel wall thickening, distention, or inflammatory changes. Diffuse colonic diverticulosis. Lymphatic: No enlarged abdominal or pelvic lymph nodes. Reproductive: Status post hysterectomy. No adnexal masses. Other: Unchanged small fat containing umbilical hernia. No free fluid or pneumoperitoneum. Musculoskeletal: No acute or significant osseous findings. Review of the MIP images confirms the above findings. IMPRESSION: Vascular: 1. No evidence of acute aortic syndrome or thoracoabdominal aneurysm. 2.  No evidence of pulmonary embolism. Chest: 1.  No acute intrathoracic process. Abdomen and pelvis: 1.  No acute intra-abdominal process. Electronically Signed   By: Obie Dredge M.D.   On: 11/15/2018 15:04    EKG: I independently viewed the EKG done and my findings are as followed: Sinus rhythm with rate of 73.  Assessment/Plan Present on Admission: . Liver cyst  Active Problems:   Liver cyst  Severe right upper quadrant pain likely secondary to large liver cyst measuring up to 8.5 cm Dr. Ewing Schlein from [facility] contacted by ED provider who recommended liver cyst drainage IR Consulted by ED provider for drainage Pain management in place with IV fentanyl as needed for severe pain LFTs unremarkable  Innumerable liver cysts with largest measuring 8.5 cm IR consulted for drainage Will need to follow-up with GI Sees Dr. Ewing Schlein outpatient  Hypokalemia Potassium 3.3 Repleted Repeat BMP in the morning  Chronic diastolic CHF Continue cardiac medications Continue strict I's and O's and daily weight No acute issues  GERD/PUD post vagotomy Continue PPI  Type 2 diabetes Obtain  hemoglobin A1c Hold oral anti-glycemic medications Start insulin sliding scale Heart healthy carb modified diet    DVT prophylaxis: SCDs  Code Status: Full code  Family Communication: None at  bedside.  Disposition Plan: Home possibly tomorrow 11/16/2018 post liver cyst drainage and improvement of symptomology.  Consults called: ED provider contacted Dr. Ewing Schlein [facility]  Admission status: Observation status    Darlin Drop MD [facility] Pager [phone]  If 7PM-7AM, please contact night-coverage [email or web address] Password [facility]  11/15/2018, 6:59 PM

## 2018-11-15 NOTE — ED Triage Notes (Signed)
Pt sent from Dr. Isidore Moos for pain on the rt side that radiates towards her back pain since Sunday and some SOB since last night. Pt rates pain a 7.

## 2018-11-15 NOTE — ED Notes (Signed)
CT called to get patient now

## 2018-11-15 NOTE — ED Notes (Signed)
Fayrene Fearing husband: [phone]

## 2018-11-15 NOTE — ED Notes (Signed)
Ct called to let them know pt ready to go

## 2018-11-15 NOTE — ED Provider Notes (Signed)
[facility] EMERGENCY DEPARTMENT Provider Note   CSN: [id number] Arrival date & time: 11/15/18  1228    History   Chief Complaint Chief Complaint  Patient presents with  . Flank Pain    HPI Jasmine Contreras is a 68 y.o. female with a past medical history of GERD/PUD status post vagotomy, TIA, mitral valve prolapse, who presents today for evaluation of right upper quadrant abdominal pain.  She reports that her pain radiates around into her back and into her right shoulder blade.  Her pain is been present since Sunday and gradually worsening.  She has tried Protonix, and Tums without significant relief.  She reports her symptoms started after she ate a hotdog.  She is status post cholecystectomy.  She reports that today she has become short of breath with this.  Her pain radiates into where right sided chest/back.  She denies any new or changed leg swelling.  She does not have a history of kidney stones, denies hematuria, increased frequency or urgency.       HPI  Past Medical History:  Diagnosis Date  . Acid reflux   . Anxiety   . Arthritis    trochanteric bursitis  . Back pain   . Chest pain   . Cough   . Depression   . Diarrhea   . Difficulty swallowing   . Dizziness   . Double vision   . Dry mouth   . Fatigue   . GERD (gastroesophageal reflux disease)   . Gout   . Headache   . Heart murmur   . Herpes   . Hiatal hernia   . Irritable bowel syndrome   . Itching   . Joint pain   . MVP (mitral valve prolapse)   . Neck stiffness   . Palpitations   . PUD (peptic ulcer disease)    s/p vagotomy  . Retaining fluid   . Ringing in ears   . Shortness of breath   . Stress   . Swelling of extremity   . Swelling of finger   . TIA (transient ischemic attack)   . Varicose veins   . Vertigo   . Vision changes   . Vitamin D deficiency   . Wheezing     Patient Active Problem List   Diagnosis Date Noted  . Liver cyst 11/15/2018  . Class 3 severe  obesity with serious comorbidity and body mass index (BMI) of 40.0 to 44.9 in adult (HCC) 05/30/2018  . Prediabetes 04/11/2018  . DOE (dyspnea on exertion) 01/17/2018  . Morbid obesity due to excess calories (HCC) 01/17/2018  . Upper airway cough syndrome 01/16/2018  . Chest pain 08/28/2012  . Abnormal cardiovascular function study 08/28/2012    Past Surgical History:  Procedure Laterality Date  . BREAST EXCISIONAL BIOPSY Left   . CHOLECYSTECTOMY    . FOOT SURGERY    . partial gastrectomy with vagotomy    . right arthroscopic knee surgery    . trochanteric bursectomy    . VAGINAL HYSTERECTOMY     with oophorectomy     OB History    Gravida  3   Para  3   Term  3   Preterm      AB      Living  3     SAB      TAB      Ectopic      Multiple      Live Births  Home Medications    Prior to Admission medications   Medication Sig Start Date End Date Taking? Authorizing Provider  BIOTIN FORTE PO Take 1 capsule by mouth every other day.     [provider]  Cholecalciferol (VITAMIN D-3 PO) Take 1,000 Int'l Units by mouth daily.    [provider]  colchicine 0.6 MG tablet Take 0.6 mg by mouth as needed. For gout flare ups    [provider]  EPINEPHrine 0.3 mg/0.3 mL IJ SOAJ injection Inject 0.3 mg into the muscle as needed.    [provider]  ferrous gluconate (IRON 27) 240 (27 FE) MG tablet Take 240 mg by mouth daily.    [provider]  hyoscyamine (LEVSIN SL) 0.125 MG SL tablet Place 0.125 mg under the tongue every 4 (four) hours as needed. Stomach spasms    [provider]  indomethacin (INDOCIN) 50 MG capsule Take 50 mg by mouth 2 (two) times daily with a meal.    [provider]  metFORMIN (GLUCOPHAGE) 500 MG tablet Take 1 tablet (500 mg total) by mouth daily with breakfast. 08/27/18   Quillian Quince D, MD  metoprolol succinate (TOPROL-XL) 25 MG 24 hr tablet TK 1 T PO QD 09/24/15    [provider]  nitroGLYCERIN (NITROSTAT) 0.4 MG SL tablet Place 0.4 mg under the tongue every 5 (five) minutes as needed for chest pain.    [provider]  pantoprazole (PROTONIX) 40 MG tablet Take 30- 60 min before your first and last meals of the day 01/16/18   Nyoka Cowden, MD  potassium chloride SA (K-DUR,KLOR-CON) 20 MEQ tablet Take 20 mEq by mouth 2 (two) times daily.     [provider]  torsemide (DEMADEX) 10 MG tablet Take 10 mg by mouth daily.    [provider]  UNABLE TO FIND Med Name: CBL oil 2 drops at bedtime    [provider]  vitamin B-12 (CYANOCOBALAMIN) 1000 MCG tablet Take 1,000 mcg by mouth daily.      [provider]    Family History Family History  Problem Relation Age of Onset  . Diabetes Mother   . Hypertension Mother   . Kidney disease Mother   . Alcohol abuse Father   . Hyperlipidemia Father   . Hypertension Father   . Stroke Father   . Depression Father   . Sleep apnea Father     Social History Social History   Tobacco Use  . Smoking status: Never Smoker  . Smokeless tobacco: Never Used  Substance Use Topics  . Alcohol use: Yes    Alcohol/week: 0.0 standard drinks    Comment: 1 glass of wine with dinner  . Drug use: No     Allergies   Peanuts [peanut oil]; Abilify [aripiprazole]; Antivert [meclizine hcl]; Codeine; Honey bee treatment [bee venom]; Hydrocodone; Loratadine; Oxycodone hcl; Prednisone; Scopace [scopolamine]; Sertraline; Shellfish allergy; Tavist-d [albertsons dayhist-d]; and Ultram [tramadol]   Review of Systems Review of Systems  Constitutional: Negative for chills and fever.  HENT: Negative for congestion, mouth sores and sore throat.   Eyes: Negative for visual disturbance.  Respiratory: Positive for shortness of breath. Negative for cough.   Cardiovascular: Positive for chest pain.  Gastrointestinal: Positive for abdominal pain. Negative for blood in stool, nausea  and vomiting.  Genitourinary: Negative for dysuria, frequency and urgency.  Musculoskeletal: Positive for back pain.  Skin: Negative for color change, rash and wound.  Neurological: Negative for weakness and  headaches.  Psychiatric/Behavioral: Negative for confusion.  All other systems reviewed and are negative.    Physical Exam Updated Vital Signs BP (!) 146/79   Pulse 76   Temp 97.7 F (36.5 C) (Oral)   Resp 17   Ht  (1.727 m)   Wt 118.8 kg   SpO2 100%   BMI 39.82 kg/m   Physical Exam Vitals signs and nursing note reviewed.  Constitutional:      General: She is in acute distress (Appears uncomfortable).     Appearance: She is well-developed.  HENT:     Head: Normocephalic and atraumatic.  Eyes:     Conjunctiva/sclera: Conjunctivae normal.  Neck:     Musculoskeletal: Normal range of motion and neck supple.  Cardiovascular:     Rate and Rhythm: Normal rate and regular rhythm.     Pulses: Normal pulses.     Heart sounds: Normal heart sounds. No murmur.     Comments: 2+ DP pulses bilaterally.  Pulmonary:     Effort: Pulmonary effort is normal. No respiratory distress.     Breath sounds: Normal breath sounds.  Abdominal:     General: Abdomen is flat. Bowel sounds are normal. There is no distension.     Palpations: Abdomen is soft.     Tenderness: There is abdominal tenderness.     Comments: RUQ is TTP.  Palpation of RLQ increases RUQ pain.  Remainder of abdomen is non-tender.   Musculoskeletal:     Right lower leg: No edema.     Left lower leg: No edema.  Skin:    General: Skin is warm and dry.     Comments: Skin is unremarkable over area of pain with no lesions, erythema, induration, ecchymosis or ulcerations.   Neurological:     General: No focal deficit present.     Mental Status: She is alert. Mental status is at baseline.     Cranial Nerves: No cranial nerve deficit.     Sensory: No sensory deficit.  Psychiatric:        Mood and Affect: Mood normal.         Behavior: Behavior normal.      ED Treatments / Results  Labs (all labs ordered are listed, but only abnormal results are displayed) Labs Reviewed  URINALYSIS, ROUTINE W REFLEX MICROSCOPIC - Abnormal; Notable for the following components:      Result Value   Color, Urine STRAW (*)    Specific Gravity, Urine 1.004 (*)    Hgb urine dipstick SMALL (*)    All other components within normal limits  CBC WITH DIFFERENTIAL/PLATELET - Abnormal; Notable for the following components:   MCV 79.5 (*)    MCH 25.1 (*)    RDW 16.4 (*)    All other components within normal limits  COMPREHENSIVE METABOLIC PANEL - Abnormal; Notable for the following components:   Potassium 3.3 (*)    Total Protein 8.2 (*)    All other components within normal limits  LIPASE, BLOOD  TROPONIN I  I-STAT CREATININE, ED    EKG EKG Interpretation  Date/Time:  Thursday Nov 15 2018 12:58:42 EDT Ventricular Rate:  73 PR Interval:    QRS Duration: 88 QT Interval:  399 QTC Calculation: 440 R Axis:   56 Text Interpretation:  Sinus rhythm Abnormal R-wave progression, early transition Artifact Confirmed by Tilden Fossa [phone]) on 11/15/2018 1:22:10 PM   Radiology Dg Chest Port 1 View  Result Date: 11/15/2018 CLINICAL DATA:  Chest pain and  shortness of breath EXAM: PORTABLE CHEST 1 VIEW COMPARISON:  09/20/2017. FINDINGS: There is no edema or consolidation. Heart is slightly enlarged with pulmonary vascularity normal. No adenopathy. No bone lesions. No pneumothorax. IMPRESSION: Mild cardiac enlargement.  No edema or consolidation. Electronically Signed   By: Bretta BangWilliam  Woodruff III M.D.   On: 11/15/2018 13:56   Ct Angio Chest/abd/pel For Dissection W And/or W/wo  Result Date: 11/15/2018 CLINICAL DATA:  Right-sided chest and abdominal pain with shortness of breath. EXAM: CT ANGIOGRAPHY CHEST, ABDOMEN AND PELVIS TECHNIQUE: Multidetector CT imaging through the chest, abdomen and pelvis was performed using the  standard protocol during bolus administration of intravenous contrast. Multiplanar reconstructed images and MIPs were obtained and reviewed to evaluate the vascular anatomy. CONTRAST:  100mL OMNIPAQUE IOHEXOL 350 MG/ML SOLN COMPARISON:  CTA chest dated August 11, 2012. CT abdomen pelvis dated Nov 03, 2011. FINDINGS: CTA CHEST FINDINGS Cardiovascular: Preferential opacification of the thoracic aorta. No evidence of thoracic aortic aneurysm or dissection. Unchanged mild cardiomegaly. No pericardial effusion. No pulmonary embolism. Normal caliber pulmonary arteries. Mediastinum/Nodes: No enlarged mediastinal, hilar, or axillary lymph nodes. Thyroid gland, trachea, and esophagus demonstrate no significant findings. Lungs/Pleura: Mild bibasilar atelectasis/scarring. No focal consolidation, pleural effusion, or pneumothorax. No suspicious pulmonary nodule. Musculoskeletal: No chest wall abnormality. No acute or significant osseous findings. Review of the MIP images confirms the above findings. CTA ABDOMEN AND PELVIS FINDINGS VASCULAR Aorta: Normal caliber aorta without aneurysm, dissection, vasculitis or significant stenosis. Celiac: Patent without evidence of aneurysm, dissection, vasculitis or significant stenosis. SMA: Patent without evidence of aneurysm, dissection, vasculitis or significant stenosis. Renals: Both renal arteries are patent without evidence of aneurysm, dissection, vasculitis, fibromuscular dysplasia or significant stenosis. IMA: Patent without evidence of aneurysm, dissection, vasculitis or significant stenosis. Inflow: Patent without evidence of aneurysm, dissection, vasculitis or significant stenosis. Veins: No obvious venous abnormality within the limitations of this arterial phase study. Review of the MIP images confirms the above findings. NON-VASCULAR Hepatobiliary: Innumerable hepatic simple cysts again noted, the majority of which have increased in size when compared to prior studies. The  largest cyst measures up to 8.5 cm. Status post cholecystectomy. No biliary dilatation. Pancreas: Unchanged mild atrophy. No pancreatic ductal dilatation or surrounding inflammatory changes. Spleen: Normal in size without focal abnormality. Adrenals/Urinary Tract: Adrenal glands are unremarkable. Kidneys are normal, without renal calculi, focal lesion, or hydronephrosis. Bladder is unremarkable. Stomach/Bowel: Unchanged surgical clips near the gastroesophageal junction. Stomach is within normal limits. History of prior appendectomy. No evidence of bowel wall thickening, distention, or inflammatory changes. Diffuse colonic diverticulosis. Lymphatic: No enlarged abdominal or pelvic lymph nodes. Reproductive: Status post hysterectomy. No adnexal masses. Other: Unchanged small fat containing umbilical hernia. No free fluid or pneumoperitoneum. Musculoskeletal: No acute or significant osseous findings. Review of the MIP images confirms the above findings. IMPRESSION: Vascular: 1. No evidence of acute aortic syndrome or thoracoabdominal aneurysm. 2.  No evidence of pulmonary embolism. Chest: 1.  No acute intrathoracic process. Abdomen and pelvis: 1.  No acute intra-abdominal process. Electronically Signed   By: Obie DredgeWilliam T Derry M.D.   On: 11/15/2018 15:04    Procedures Procedures (including critical care time)  Medications Ordered in ED Medications  fentaNYL (SUBLIMAZE) injection 50 mcg (has no administration in time range)  fentaNYL (SUBLIMAZE) injection 50 mcg (50 mcg Intravenous Given 11/15/18 1320)  iohexol (OMNIPAQUE) 350 MG/ML injection 100 mL (100 mLs Intravenous Contrast Given 11/15/18 1436)  sodium chloride 0.9 % bolus 500 mL (500 mLs Intravenous New Bag/Given 11/15/18 1531)  Initial Impression / Assessment and Plan / ED Course  I have reviewed the triage vital signs and the nursing notes.  Pertinent labs & imaging results that were available during my care of the patient were reviewed by me and  considered in my medical decision making (see chart for details).  Clinical Course as of Nov 15 1718  Thu Nov 15, 2018  1536 McGot with Enid Baas.    [EH]  [id number] [phone]   [EH]  [phone] Book with Dr. Ewing Schlein from [facility].  He states that it is possible that her pain could be caused by the cyst especially if they have increased in size significantly.  Patient is still in significant pain.  He will see patient tomorrow as an inpatient.   [EH]  1659 Spoke with hosptialist who will admit.    [EH]    Clinical Course User Index [EH] Maghan, Jessee, PA-C      Patient presents today for evaluation of right-sided upper abdominal/chest pain since 4 days ago that has significantly worsened today to include shortness of breath.  She is status post cholecystectomy.  On exam she is tachypneic and appears uncomfortable with slightly labored breathing.  Her abdomen is generally tender in the right upper quadrant.  She does not have a history of renal stones and is not having any urinary symptoms.  Based on the severity of her symptoms and progression, combined with her age, concern for aortic dissection versus PE.  Dr. Madilyn Hook was made aware of patient.  I-STAT creatinine was obtained which was not elevated.  Patient's nurse is aware of concerns.  CT dissection study was ordered.  CT dissection study without evidence of acute abnormalities.  Labs show very mild hypokalemia with a potassium of 3.3, troponin and lipase are both not elevated.  No evidence of dissection, or PE.  Her cysts on her liver have enlarged with the largest being approximately 8.6 cm.  I spoke with patient's GI Dr. Ewing Schlein who states that it would be reasonable to get IR to evaluate for aspiration versus drainage.  Patient appeared very uncomfortable on arrival, her pain was temporarily controlled with fentanyl however then returned and increased.  Given the recurrent nature and severity of patient's pain I gave her the option of evaluation as  an inpatient versus.  With her pain increasing again I feel that it is reasonable for her to have inpatient evaluation for continued pain control.  Spoke with Dr. Margo Aye who agreed to admit the patient.  This patient was seen as a shared visit with Dr. Madilyn Hook.    Final Clinical Impressions(s) / ED Diagnoses   Final diagnoses:  Right upper quadrant abdominal pain  Liver cyst    ED Discharge Orders    None       Iva, [place] 11/15/18 Wellington Hampshire, MD 11/16/18 1055

## 2018-11-16 ENCOUNTER — Encounter (HOSPITAL_COMMUNITY): Payer: Self-pay | Admitting: Gastroenterology

## 2018-11-16 ENCOUNTER — Observation Stay (HOSPITAL_COMMUNITY): Payer: Medicare Other

## 2018-11-16 DIAGNOSIS — R1011 Right upper quadrant pain: Secondary | ICD-10-CM | POA: Diagnosis not present

## 2018-11-16 DIAGNOSIS — R7303 Prediabetes: Secondary | ICD-10-CM

## 2018-11-16 DIAGNOSIS — E876 Hypokalemia: Secondary | ICD-10-CM | POA: Diagnosis present

## 2018-11-16 DIAGNOSIS — E669 Obesity, unspecified: Secondary | ICD-10-CM

## 2018-11-16 DIAGNOSIS — Z90710 Acquired absence of both cervix and uterus: Secondary | ICD-10-CM | POA: Diagnosis not present

## 2018-11-16 DIAGNOSIS — Z7984 Long term (current) use of oral hypoglycemic drugs: Secondary | ICD-10-CM | POA: Diagnosis not present

## 2018-11-16 DIAGNOSIS — Z888 Allergy status to other drugs, medicaments and biological substances status: Secondary | ICD-10-CM | POA: Diagnosis not present

## 2018-11-16 DIAGNOSIS — I110 Hypertensive heart disease with heart failure: Secondary | ICD-10-CM | POA: Diagnosis present

## 2018-11-16 DIAGNOSIS — R896 Abnormal cytological findings in specimens from other organs, systems and tissues: Secondary | ICD-10-CM | POA: Diagnosis not present

## 2018-11-16 DIAGNOSIS — Z885 Allergy status to narcotic agent status: Secondary | ICD-10-CM | POA: Diagnosis not present

## 2018-11-16 DIAGNOSIS — Z79899 Other long term (current) drug therapy: Secondary | ICD-10-CM | POA: Diagnosis not present

## 2018-11-16 DIAGNOSIS — Z9049 Acquired absence of other specified parts of digestive tract: Secondary | ICD-10-CM | POA: Diagnosis not present

## 2018-11-16 DIAGNOSIS — I5032 Chronic diastolic (congestive) heart failure: Secondary | ICD-10-CM | POA: Diagnosis present

## 2018-11-16 DIAGNOSIS — K279 Peptic ulcer, site unspecified, unspecified as acute or chronic, without hemorrhage or perforation: Secondary | ICD-10-CM | POA: Diagnosis present

## 2018-11-16 DIAGNOSIS — Z6839 Body mass index (BMI) 39.0-39.9, adult: Secondary | ICD-10-CM | POA: Diagnosis not present

## 2018-11-16 DIAGNOSIS — R932 Abnormal findings on diagnostic imaging of liver and biliary tract: Secondary | ICD-10-CM | POA: Diagnosis not present

## 2018-11-16 DIAGNOSIS — Z8673 Personal history of transient ischemic attack (TIA), and cerebral infarction without residual deficits: Secondary | ICD-10-CM | POA: Diagnosis not present

## 2018-11-16 DIAGNOSIS — Z20828 Contact with and (suspected) exposure to other viral communicable diseases: Secondary | ICD-10-CM | POA: Diagnosis present

## 2018-11-16 DIAGNOSIS — K7689 Other specified diseases of liver: Secondary | ICD-10-CM | POA: Diagnosis present

## 2018-11-16 DIAGNOSIS — K219 Gastro-esophageal reflux disease without esophagitis: Secondary | ICD-10-CM | POA: Diagnosis present

## 2018-11-16 DIAGNOSIS — I341 Nonrheumatic mitral (valve) prolapse: Secondary | ICD-10-CM | POA: Diagnosis present

## 2018-11-16 DIAGNOSIS — Z903 Acquired absence of stomach [part of]: Secondary | ICD-10-CM | POA: Diagnosis not present

## 2018-11-16 DIAGNOSIS — M109 Gout, unspecified: Secondary | ICD-10-CM | POA: Diagnosis present

## 2018-11-16 LAB — GLUCOSE, CAPILLARY
Glucose-Capillary: 90 mg/dL (ref 70–99)
Glucose-Capillary: 86 mg/dL (ref 70–99)
Glucose-Capillary: 116 mg/dL — ABNORMAL HIGH (ref 70–99)
Glucose-Capillary: 97 mg/dL (ref 70–99)
Glucose-Capillary: 117 mg/dL — ABNORMAL HIGH (ref 70–99)

## 2018-11-16 LAB — GRAM STAIN: Special Requests: 2

## 2018-11-16 LAB — COMPREHENSIVE METABOLIC PANEL
ALT: 16 U/L (ref 0–44)
AST: 15 U/L (ref 15–41)
Albumin: 3.1 g/dL — ABNORMAL LOW (ref 3.5–5.0)
Alkaline Phosphatase: 91 U/L (ref 38–126)
Anion gap: 9 (ref 5–15)
BUN: 9 mg/dL (ref 8–23)
CO2: 27 mmol/L (ref 22–32)
Calcium: 8.2 mg/dL — ABNORMAL LOW (ref 8.9–10.3)
Chloride: 104 mmol/L (ref 98–111)
Creatinine, Ser: 0.94 mg/dL (ref 0.44–1.00)
GFR calc Af Amer: >60 mL/min (ref >60)
GFR calc non Af Amer: >60 mL/min (ref >60)
Glucose, Bld: 115 mg/dL — ABNORMAL HIGH (ref 70–99)
Potassium: 3.4 mmol/L — ABNORMAL LOW (ref 3.5–5.1)
Sodium: 140 mmol/L (ref 135–145)
Total Bilirubin: 1.1 mg/dL (ref 0.3–1.2)
Total Protein: 6.1 g/dL — ABNORMAL LOW (ref 6.5–8.1)

## 2018-11-16 LAB — CBC WITH DIFFERENTIAL/PLATELET
Abs Immature Granulocytes: 0.02 10*3/uL (ref 0.00–0.07)
Basophils Absolute: 0 10*3/uL (ref 0.0–0.1)
Basophils Relative: 0 %
Eosinophils Absolute: 0.2 10*3/uL (ref 0.0–0.5)
Eosinophils Relative: 3 %
HCT: 32.5 % — ABNORMAL LOW (ref 36.0–46.0)
Hemoglobin: 10.6 g/dL — ABNORMAL LOW (ref 12.0–15.0)
Immature Granulocytes: 0 %
Lymphocytes Relative: 27 %
Lymphs Abs: 1.9 10*3/uL (ref 0.7–4.0)
MCH: 25.5 pg — ABNORMAL LOW (ref 26.0–34.0)
MCHC: 32.6 g/dL (ref 30.0–36.0)
MCV: 78.1 fL — ABNORMAL LOW (ref 80.0–100.0)
Monocytes Absolute: 0.6 10*3/uL (ref 0.1–1.0)
Monocytes Relative: 9 %
Neutro Abs: 4.3 10*3/uL (ref 1.7–7.7)
Neutrophils Relative %: 61 %
Platelets: 248 10*3/uL (ref 150–400)
RBC: 4.16 MIL/uL (ref 3.87–5.11)
RDW: 16.1 % — ABNORMAL HIGH (ref 11.5–15.5)
WBC: 7 10*3/uL (ref 4.0–10.5)
nRBC: 0 % (ref 0.0–0.2)

## 2018-11-16 LAB — HEMOGLOBIN A1C
Hgb A1c MFr Bld: 6.4 % — ABNORMAL HIGH (ref 4.8–5.6)
Mean Plasma Glucose: 136.98 mg/dL

## 2018-11-16 LAB — BODY FLUID CELL COUNT WITH DIFFERENTIAL
Eos, Fluid: 3 %
Lymphs, Fluid: 23 %
Lymphs, Fluid: 77 %
Monocyte-Macrophage-Serous Fluid: 1 % — ABNORMAL LOW (ref 50–90)
Monocyte-Macrophage-Serous Fluid: 5 % — ABNORMAL LOW (ref 50–90)
Neutrophil Count, Fluid: 73 % — ABNORMAL HIGH (ref 0–25)
Neutrophil Count, Fluid: 16 % (ref 0–25)
Total Nucleated Cell Count, Fluid: 300 cu mm (ref 0–1000)
Total Nucleated Cell Count, Fluid: 563 cu mm (ref 0–1000)

## 2018-11-16 LAB — PROTIME-INR
INR: 1.1 (ref 0.8–1.2)
Prothrombin Time: 14.4 seconds (ref 11.4–15.2)

## 2018-11-16 MED ORDER — MIDAZOLAM HCL 2 MG/2ML IJ SOLN
INTRAMUSCULAR | Status: AC
  Filled 2018-11-16: qty 2

## 2018-11-16 MED ORDER — MIDAZOLAM HCL 2 MG/2ML IJ SOLN
INTRAMUSCULAR | Status: AC | PRN
  Administered 2018-11-16 (×2): 0.5 mg via INTRAVENOUS
  Administered 2018-11-16: 1 mg via INTRAVENOUS

## 2018-11-16 MED ORDER — FENTANYL CITRATE (PF) 100 MCG/2ML IJ SOLN
INTRAMUSCULAR | Status: AC | PRN
  Administered 2018-11-16: 50 ug via INTRAVENOUS
  Administered 2018-11-16 (×2): 25 ug via INTRAVENOUS

## 2018-11-16 MED ORDER — FENTANYL CITRATE (PF) 100 MCG/2ML IJ SOLN
INTRAMUSCULAR | Status: AC
  Filled 2018-11-16: qty 2

## 2018-11-16 NOTE — Progress Notes (Signed)
PROGRESS NOTE  Tennis Mustlizabeth M Paolella ZOX:[id number] DOB: 06/15/1951 DOA: 11/15/2018 PCP: Laurann MontanaWhite, Cynthia, MD  HPI/Recap of past 24 hours:  Patient is seen after returned from liver cyst aspiration She reports pain is improving  Assessment/Plan: Active Problems:   Liver cyst  Innumerable liver cysts with largest measuring 8.5 cm, LFTs unremarkable Severe right upper quadrant pain likely secondary to large liver cyst measuring up to 8.5 cm Dr. Ewing SchleinMagod [facility] contacted by ED provider who recommended liver cyst drainage S/p CT/US guided aspiration of two cysts: 250cc slightly blood tinged seroud fluids from dominant  cyst within the dome of the right lobe of the liver. aspiration of 50 cc of serous fluid from dominant cyst within the subcapsular aspect of the left lobe of the liver. Fluids study including cell count,culture, gram stain, cytology sent Pain management in place with IV fentanyl as needed for severe pain Case discussed with DR Dr Ewing SchleinMagod and IR Dr Grace IsaacWatts Appreciate GI and IR input    Hypokalemia Potassium 3.3 Repleted Repeat BMP in the morning, check mag  Chronic diastolic CHF, per chart she denies h/o heart failure She reports on demadex for ankle edema, she reports has been on metoprolol for palpitation but not for heart disease or HTN, she reports has not used sublingual nitro for the last 6468yrs She reports used to see cardiology Dr Donnie Ahoilley  She does not want heart healthy diet  GERD/PUD post vagotomy Continue PPI  Prediabetes She is on metformin hemoglobin A1c 6.4 She dose not want to have frequent FSBG checked, she does not want sliding scale insulin She agree to carb modified diet  Obesity/prediabetes, she is [facility] Body mass index is 39.82 kg/m.    Code Status: full  Family Communication: patient   Disposition Plan: home in am, if pain well controlled and able to  eat   Consultants:  IR  [facility] Dr Ewing SchleinMagod  Procedures:  CT/Us  Antibiotics:  none   Objective: BP 126/74 (BP Location: Left Arm)    Pulse 62    Temp 98 F (36.7 C) (Oral)    Resp 18    Ht 5\' 8"  (1.727 m)    Wt 118.8 kg    SpO2 94%    BMI 39.82 kg/m   Intake/Output Summary (Last 24 hours) at 11/16/2018 1419 Last data filed at 11/16/2018 1030 Gross per 24 hour  Intake 0 ml  Output 600 ml  Net -600 ml   Filed Weights   11/15/18 1255  Weight: 118.8 kg    Exam: Patient is examined daily including today on 11/16/2018, exams remain the same as of yesterday except that has changed    General:  NAD  Cardiovascular: RRR  Respiratory: CTABL  Abdomen: Soft/ND/NT, positive BS  Musculoskeletal: No Edema  Neuro: alert, oriented   Data Reviewed: Basic Metabolic Panel: Recent Labs  Lab 11/15/18 1312 11/15/18 1321 11/16/18 0151  NA 141  --  140  K 3.3*  --  3.4*  CL 102  --  104  CO2 25  --  27  GLUCOSE 93  --  115*  BUN 10  --  9  CREATININE 0.76 0.60 0.94  CALCIUM 9.5  --  8.2*   Liver Function Tests: Recent Labs  Lab 11/15/18 1312 11/16/18 0151  AST 23 15  ALT 19 16  ALKPHOS 100 91  BILITOT 1.1 1.1  PROT 8.2* 6.1*  ALBUMIN 3.9 3.1*   Recent Labs  Lab 11/15/18 1312  LIPASE 25   No results for input(s): AMMONIA in the last 168 hours. CBC: Recent Labs  Lab 11/15/18 1312 11/16/18 0151  WBC 7.4 7.0  NEUTROABS 4.3 4.3  HGB 12.2 10.6*  HCT 38.7 32.5*  MCV 79.5* 78.1*  PLT 278 248   Cardiac Enzymes:   Recent Labs  Lab 11/15/18 1312  TROPONINI <0.03   BNP (last 3 results) No results for input(s): BNP in the last 8760 hours.  ProBNP (last 3 results) No results for input(s): PROBNP in the last 8760 hours.  CBG: Recent Labs  Lab 11/15/18 2057 11/16/18 0749 11/16/18 1217  GLUCAP 107* 90 86    Recent Results (from the past 240 hour(s))  SARS Coronavirus 2 [facility] order, Performed in [facility] hospital lab)     Status: None    Collection Time: 11/15/18  6:10 PM  Result Value Ref Range Status   SARS Coronavirus 2 NEGATIVE NEGATIVE Final    Comment: (NOTE) If result is NEGATIVE SARS-CoV-2 target nucleic acids are NOT DETECTED. The SARS-CoV-2 RNA is generally detectable in upper and lower  respiratory specimens during the acute phase of infection. The lowest  concentration of SARS-CoV-2 viral copies this assay can detect is 250  copies / mL. A negative result does not preclude SARS-CoV-2 infection  and should not be used as the sole basis for treatment or other  patient management decisions.  A negative result may occur with  improper specimen collection / handling, submission of specimen other  than nasopharyngeal swab, presence of viral mutation(s) within the  areas targeted by this assay, and inadequate number of viral copies  (<250 copies / mL). A negative result must be combined with clinical  observations, patient history, and epidemiological information. If result is POSITIVE SARS-CoV-2 target nucleic acids are DETECTED. The SARS-CoV-2 RNA is generally detectable in upper and lower  respiratory specimens dur ing the acute phase of infection.  Positive  results are indicative of active infection with SARS-CoV-2.  Clinical  correlation with patient history and other diagnostic information is  necessary to determine patient infection status.  Positive results do  not rule out bacterial infection or co-infection with other viruses. If result is PRESUMPTIVE POSTIVE SARS-CoV-2 nucleic acids MAY BE PRESENT.   A presumptive positive result was obtained on the submitted specimen  and confirmed on repeat testing.  While 2019 novel coronavirus  (SARS-CoV-2) nucleic acids may be present in the submitted sample  additional confirmatory testing may be necessary for epidemiological  and / or clinical management purposes  to differentiate between  SARS-CoV-2 and other Sarbecovirus currently known to infect humans.   If clinically indicated additional testing with an alternate test  methodology [phone]) is advised. The SARS-CoV-2 RNA is generally  detectable in upper and lower respiratory sp ecimens during the acute  phase of infection. The expected result is Negative. Fact Sheet for Patients:  BoilerBrush.com.cy Fact Sheet for Healthcare Providers: https://pope.com/ This test is not yet approved or cleared by the [place] FDA and has been authorized for detection and/or diagnosis of SARS-CoV-2 by FDA under an Emergency Use Authorization (EUA).  This EUA will remain in effect (meaning this test can be used) for the duration of the COVID-19 declaration under Section 564(b)(1) of the Act, 21 U.S.C. section 360bbb-3(b)(1), unless the authorization is terminated or revoked sooner. Performed at [facility], [address], [place], [place] [address]   Gram stain     Status: None  Collection Time: 11/16/18  9:24 AM  Result Value Ref Range Status   Specimen Description FLUID LIVER CYST  Final   Special Requests NONE  Final   Gram Stain   Final    FEW WBC PRESENT, PREDOMINANTLY MONONUCLEAR NO ORGANISMS SEEN Performed at [facility], [address], [place], [place] [address]    Report Status 11/16/2018 FINAL  Final  Gram stain     Status: None   Collection Time: 11/16/18 11:13 AM  Result Value Ref Range Status   Specimen Description FLUID LIVER CYST  Final   Special Requests 2  Final   Gram Stain   Final    RARE WBC PRESENT, PREDOMINANTLY MONONUCLEAR NO ORGANISMS SEEN Performed at [facility], [address] [place], [place] [address]    Report Status 11/16/2018 FINAL  Final     Studies: Ct Aspiration  Result Date: 11/16/2018 INDICATION: Right upper quadrant abdominal pain. Concern for symptomatic hepatic cysts. Please perform image guided hepatic cyst aspiration/drainage catheter placement for diagnostic and  therapeutic purposes. EXAM: ULTRASOUND AND CT-GUIDED HEPATIC CYST ASPIRATION X2 COMPARISON:  CT of the chest, abdomen and pelvis - 11/15/2018 MEDICATIONS: The patient is currently admitted to the hospital and receiving intravenous antibiotics. The antibiotics were administered within an appropriate time frame prior to the initiation of the procedure. ANESTHESIA/SEDATION: Moderate (conscious) sedation was employed during this procedure. A total of Versed 2 mg and Fentanyl 100 mcg was administered intravenously. Moderate Sedation Time: 28 minutes. The patient's level of consciousness and vital signs were monitored continuously by radiology nursing throughout the procedure under my direct supervision. CONTRAST:  None COMPLICATIONS: None immediate. PROCEDURE: Informed written consent was obtained from patient after a discussion of the risks, benefits and alternatives to treatment. The patient was placed supine on the CT gantry and a pre procedural CT was performed re-demonstrating the known multiple hepatic cysts with dominant cyst within the subcapsular aspect of the right lobe of the liver measuring approximately 8.8 x 6.9 cm (image 11, series 2) and additional partially exophytic cyst arising from the medial segment of the left lobe of the liver measuring approximately 5.1 x 4.6 cm (image 35, series 2). The procedures were planned. A timeout was performed prior to the initiation of the procedure. The skin overlying the right upper abdomen was prepped and draped in the usual sterile fashion. The overlying soft tissues were anesthetized with 1% lidocaine with epinephrine. Under direct ultrasound guidance, the dominant cyst within the dome of the right lobe of the liver was accessed with a 18 gauge trocar needle. Ultrasound image was saved for procedural documentation purposes. An Amplatz wire was coiled within the collection. Appropriate position was confirmed with CT imaging. Next, the trocar needle was exchanged for  a Yueh sheath catheter. Approximately 250 cc of blood-tinged serous fluid was aspirated as the catheter was slowly retracted. The identical procedure was repeated for the additional exophytic lesion within the left lobe of the liver yielding 50 cc of serous fluid. Samples from both cysts were sent separately to the laboratory for analysis. Postprocedural CT imaging demonstrates complete resolution of both dominant potentially symptomatic hepatic cysts. Dressings were applied. The patient tolerated the procedure well without immediate post procedural complication. IMPRESSION: 1. Successful ultrasound and CT-guided aspiration 250 cc of blood tinged serous fluid from the dominant cyst within the dome of the right lobe of the liver. 2. Successful ultrasound and CT-guided aspiration of 50 cc of serous fluid from the dominant partially  exophytic cyst arising from the left lobe of the liver. 3. Samples from both aspirated cysts were sent separately to the laboratory for analysis. Electronically Signed   By: Simonne Come M.D.   On: 11/16/2018 12:47   Ct Aspiration  Result Date: 11/16/2018 INDICATION: Right upper quadrant abdominal pain. Concern for symptomatic hepatic cysts. Please perform image guided hepatic cyst aspiration/drainage catheter placement for diagnostic and therapeutic purposes. EXAM: ULTRASOUND AND CT-GUIDED HEPATIC CYST ASPIRATION X2 COMPARISON:  CT of the chest, abdomen and pelvis - 11/15/2018 MEDICATIONS: The patient is currently admitted to the hospital and receiving intravenous antibiotics. The antibiotics were administered within an appropriate time frame prior to the initiation of the procedure. ANESTHESIA/SEDATION: Moderate (conscious) sedation was employed during this procedure. A total of Versed 2 mg and Fentanyl 100 mcg was administered intravenously. Moderate Sedation Time: 28 minutes. The patient's level of consciousness and vital signs were monitored continuously by radiology nursing  throughout the procedure under my direct supervision. CONTRAST:  None COMPLICATIONS: None immediate. PROCEDURE: Informed written consent was obtained from patient after a discussion of the risks, benefits and alternatives to treatment. The patient was placed supine on the CT gantry and a pre procedural CT was performed re-demonstrating the known multiple hepatic cysts with dominant cyst within the subcapsular aspect of the right lobe of the liver measuring approximately 8.8 x 6.9 cm (image 11, series 2) and additional partially exophytic cyst arising from the medial segment of the left lobe of the liver measuring approximately 5.1 x 4.6 cm (image 35, series 2). The procedures were planned. A timeout was performed prior to the initiation of the procedure. The skin overlying the right upper abdomen was prepped and draped in the usual sterile fashion. The overlying soft tissues were anesthetized with 1% lidocaine with epinephrine. Under direct ultrasound guidance, the dominant cyst within the dome of the right lobe of the liver was accessed with a 18 gauge trocar needle. Ultrasound image was saved for procedural documentation purposes. An Amplatz wire was coiled within the collection. Appropriate position was confirmed with CT imaging. Next, the trocar needle was exchanged for a Yueh sheath catheter. Approximately 250 cc of blood-tinged serous fluid was aspirated as the catheter was slowly retracted. The identical procedure was repeated for the additional exophytic lesion within the left lobe of the liver yielding 50 cc of serous fluid. Samples from both cysts were sent separately to the laboratory for analysis. Postprocedural CT imaging demonstrates complete resolution of both dominant potentially symptomatic hepatic cysts. Dressings were applied. The patient tolerated the procedure well without immediate post procedural complication. IMPRESSION: 1. Successful ultrasound and CT-guided aspiration 250 cc of blood tinged  serous fluid from the dominant cyst within the dome of the right lobe of the liver. 2. Successful ultrasound and CT-guided aspiration of 50 cc of serous fluid from the dominant partially exophytic cyst arising from the left lobe of the liver. 3. Samples from both aspirated cysts were sent separately to the laboratory for analysis. Electronically Signed   By: Simonne Come M.D.   On: 11/16/2018 12:47   Ct Angio Chest/abd/pel For Dissection W And/or W/wo  Result Date: 11/15/2018 CLINICAL DATA:  Right-sided chest and abdominal pain with shortness of breath. EXAM: CT ANGIOGRAPHY CHEST, ABDOMEN AND PELVIS TECHNIQUE: Multidetector CT imaging through the chest, abdomen and pelvis was performed using the standard protocol during bolus administration of intravenous contrast. Multiplanar reconstructed images and MIPs were obtained and reviewed to evaluate the vascular anatomy. CONTRAST:  OMNIPAQUE  IOHEXOL 350 MG/ML SOLN COMPARISON:  CTA chest dated August 11, 2012. CT abdomen pelvis dated Nov 03, 2011. FINDINGS: CTA CHEST FINDINGS Cardiovascular: Preferential opacification of the thoracic aorta. No evidence of thoracic aortic aneurysm or dissection. Unchanged mild cardiomegaly. No pericardial effusion. No pulmonary embolism. Normal caliber pulmonary arteries. Mediastinum/Nodes: No enlarged mediastinal, hilar, or axillary lymph nodes. Thyroid gland, trachea, and esophagus demonstrate no significant findings. Lungs/Pleura: Mild bibasilar atelectasis/scarring. No focal consolidation, pleural effusion, or pneumothorax. No suspicious pulmonary nodule. Musculoskeletal: No chest wall abnormality. No acute or significant osseous findings. Review of the MIP images confirms the above findings. CTA ABDOMEN AND PELVIS FINDINGS VASCULAR Aorta: Normal caliber aorta without aneurysm, dissection, vasculitis or significant stenosis. Celiac: Patent without evidence of aneurysm, dissection, vasculitis or significant stenosis. SMA:  Patent without evidence of aneurysm, dissection, vasculitis or significant stenosis. Renals: Both renal arteries are patent without evidence of aneurysm, dissection, vasculitis, fibromuscular dysplasia or significant stenosis. IMA: Patent without evidence of aneurysm, dissection, vasculitis or significant stenosis. Inflow: Patent without evidence of aneurysm, dissection, vasculitis or significant stenosis. Veins: No obvious venous abnormality within the limitations of this arterial phase study. Review of the MIP images confirms the above findings. NON-VASCULAR Hepatobiliary: Innumerable hepatic simple cysts again noted, the majority of which have increased in size when compared to prior studies. The largest cyst measures up to 8.5 cm. Status post cholecystectomy. No biliary dilatation. Pancreas: Unchanged mild atrophy. No pancreatic ductal dilatation or surrounding inflammatory changes. Spleen: Normal in size without focal abnormality. Adrenals/Urinary Tract: Adrenal glands are unremarkable. Kidneys are normal, without renal calculi, focal lesion, or hydronephrosis. Bladder is unremarkable. Stomach/Bowel: Unchanged surgical clips near the gastroesophageal junction. Stomach is within normal limits. History of prior appendectomy. No evidence of bowel wall thickening, distention, or inflammatory changes. Diffuse colonic diverticulosis. Lymphatic: No enlarged abdominal or pelvic lymph nodes. Reproductive: Status post hysterectomy. No adnexal masses. Other: Unchanged small fat containing umbilical hernia. No free fluid or pneumoperitoneum. Musculoskeletal: No acute or significant osseous findings. Review of the MIP images confirms the above findings. IMPRESSION: Vascular: 1. No evidence of acute aortic syndrome or thoracoabdominal aneurysm. 2.  No evidence of pulmonary embolism. Chest: 1.  No acute intrathoracic process. Abdomen and pelvis: 1.  No acute intra-abdominal process. Electronically Signed   By: Obie Dredge  M.D.   On: 11/15/2018 15:04    Scheduled Meds:  fentaNYL       insulin aspart  0-5 Units Subcutaneous QHS   insulin aspart  0-9 Units Subcutaneous TID WC   metoprolol succinate  25 mg Oral QHS   midazolam       pantoprazole  40 mg Oral BID AC   torsemide  30 mg Oral Daily    Continuous Infusions:   Time spent: I have personally reviewed and interpreted on  11/16/2018 daily labs, imagings as discussed above under date review session and assessment and plans.  I reviewed all nursing notes, pharmacy notes, consultant notes,  vitals, pertinent old records  I have discussed plan of care as described above with RN , patient  on 11/16/2018   Albertine Grates MD, PhD  [facility] Pager [phone]. If 7PM-7AM, please contact night-coverage at [email or web address], password [facility] 11/16/2018, 2:19 PM  LOS: 0 days

## 2018-11-16 NOTE — Progress Notes (Signed)
Patient transported in bed to IR by transporter. Consent to be signed in IR.

## 2018-11-16 NOTE — Consult Note (Signed)
Reason for Consult: Abdominal pain Referring Physician: Hospital team  Jasmine Contreras is an 68 y.o. female.  HPI: Patient known to me from years of various GI issues some due to adhesions and her case discussed with the ER physician yesterday in the hospital team today and her hospital computer chart in our office computer chart was reviewed and her last colonoscopy and endoscopy was 2 years ago and that was the last time I have seen her and she has done pretty well overall with occasional GI issues and she had to quit her job due to taking care of her parents and her husband but had acute onset of right upper quadrant pain radiating to her shoulder for 3 to 4 days.any obvious vomiting fever or change in bowel habits and labs and CT were reviewed and she had 2 cysts aspirated today and she is feeling much better now has no complaints and has no other medical issues since I have seen her Past Medical History:  Diagnosis Date  . Acid reflux   . Anxiety   . Arthritis    trochanteric bursitis  . Back pain   . Chest pain   . Cough   . Depression   . Diarrhea   . Difficulty swallowing   . Dizziness   . Double vision   . Dry mouth   . Fatigue   . GERD (gastroesophageal reflux disease)   . Gout   . Headache   . Heart murmur   . Herpes   . Hiatal hernia   . Irritable bowel syndrome   . Itching   . Joint pain   . MVP (mitral valve prolapse)   . Neck stiffness   . Palpitations   . PUD (peptic ulcer disease)    s/p vagotomy  . Retaining fluid   . Ringing in ears   . Shortness of breath   . Stress   . Swelling of extremity   . Swelling of finger   . TIA (transient ischemic attack)   . Varicose veins   . Vertigo   . Vision changes   . Vitamin D deficiency   . Wheezing     Past Surgical History:  Procedure Laterality Date  . BREAST EXCISIONAL BIOPSY Left   . CHOLECYSTECTOMY    . FOOT SURGERY    . partial gastrectomy with vagotomy    . right arthroscopic knee surgery    .  trochanteric bursectomy    . VAGINAL HYSTERECTOMY     with oophorectomy    Family History  Problem Relation Age of Onset  . Diabetes Mother   . Hypertension Mother   . Kidney disease Mother   . Alcohol abuse Father   . Hyperlipidemia Father   . Hypertension Father   . Stroke Father   . Depression Father   . Sleep apnea Father     Social History:  reports that she has never smoked. She has never used smokeless tobacco. She reports current alcohol use. She reports that she does not use drugs.  Allergies:  Allergies  Allergen Reactions  . Honey Bee Treatment [Bee Venom] Anaphylaxis and Swelling    Body swells  . Other Other (See Comments)    Has diverticulitis- cannot have ANY FOODS WITH SEEDS!!  Marland Kitchen Shellfish-Derived Products Anaphylaxis  . Strawberry Extract Anaphylaxis  . Peanuts [Peanut Oil] Hives  . Abilify [Aripiprazole] Other (See Comments)    Somnolence    . Adhesive [Tape] Hives and Itching  . Codeine  Itching and Nausea And Vomiting  . Hydrocodone Itching and Nausea And Vomiting  . Latex Hives and Itching  . Meclizine Hcl Nausea Only  . Oxycodone Hcl Itching and Nausea And Vomiting  . Prednisone Other (See Comments) and Hypertension    "Makes my face turn red, also"  . Scopace [Scopolamine] Nausea Only  . Sertraline Other (See Comments)    Suicidal thoughts   . Tavist-D [Albertsons Dayhist-D] Other (See Comments)    Makes my nose drain  . Tramadol Nausea Only    Medications: I have reviewed the patient's current medications.  Results for orders placed or performed during the hospital encounter of 11/15/18 (from the past 48 hour(s))  Urinalysis, Routine w reflex microscopic     Status: Abnormal   Collection Time: 11/15/18  1:02 PM  Result Value Ref Range   Color, Urine STRAW (A) YELLOW   APPearance CLEAR CLEAR   Specific Gravity, Urine 1.004 (L) 1.005 - 1.030   pH 6.0 5.0 - 8.0   Glucose, UA NEGATIVE NEGATIVE mg/dL   Hgb urine dipstick SMALL (A)  NEGATIVE   Bilirubin Urine NEGATIVE NEGATIVE   Ketones, ur NEGATIVE NEGATIVE mg/dL   Protein, ur NEGATIVE NEGATIVE mg/dL   Nitrite NEGATIVE NEGATIVE   Leukocytes,Ua NEGATIVE NEGATIVE   RBC / HPF 0-5 0 - 5 RBC/hpf   WBC, UA 0-5 0 - 5 WBC/hpf   Bacteria, UA NONE SEEN NONE SEEN   Squamous Epithelial / LPF 6-10 0 - 5    Comment: Performed at [facility], [address] [place], [place] [address]  CBC with Differential     Status: Abnormal   Collection Time: 11/15/18  1:12 PM  Result Value Ref Range   WBC 7.4 4.0 - 10.5 K/uL   RBC 4.87 3.87 - 5.11 MIL/uL   Hemoglobin 12.2 12.0 - 15.0 g/dL   HCT 41.338.7 24.436.0 - 01.046.0 %   MCV 79.5 (L) 80.0 - 100.0 fL   MCH 25.1 (L) 26.0 - 34.0 pg   MCHC 31.5 30.0 - 36.0 g/dL   RDW 27.216.4 (H) 53.611.5 - 64.415.5 %   Platelets 278 150 - 400 K/uL   nRBC 0.0 0.0 - 0.2 %   Neutrophils Relative % 58 %   Neutro Abs 4.3 1.7 - 7.7 K/uL   Lymphocytes Relative 33 %   Lymphs Abs 2.5 0.7 - 4.0 K/uL   Monocytes Relative 7 %   Monocytes Absolute 0.5 0.1 - 1.0 K/uL   Eosinophils Relative 2 %   Eosinophils Absolute 0.2 0.0 - 0.5 K/uL   Basophils Relative 0 %   Basophils Absolute 0.0 0.0 - 0.1 K/uL   Immature Granulocytes 0 %   Abs Immature Granulocytes 0.02 0.00 - 0.07 K/uL    Comment: Performed at [facility], [address] [place], [place] [address]  Comprehensive metabolic panel     Status: Abnormal   Collection Time: 11/15/18  1:12 PM  Result Value Ref Range   Sodium 141 135 - 145 mmol/L   Potassium 3.3 (L) 3.5 - 5.1 mmol/L   Chloride 102 98 - 111 mmol/L   CO2 25 22 - 32 mmol/L   Glucose, Bld 93 70 - 99 mg/dL   BUN 10 8 - 23 mg/dL   Creatinine, Ser 2.590.76 0.44 - 1.00 mg/dL   Calcium 9.5 8.9 - 56.310.3 mg/dL   Total Protein 8.2 (H) 6.5 - 8.1 g/dL   Albumin 3.9 3.5 - 5.0 g/dL   AST 23 15 -  41 U/L   ALT 19 0 - 44 U/L   Alkaline Phosphatase 100 38 - 126 U/L   Total Bilirubin 1.1 0.3 - 1.2 mg/dL   GFR calc non Af Amer >60 >60 mL/min   GFR calc Af Amer >60  >60 mL/min   Anion gap 14 5 - 15    Comment: Performed at [facility], [address], [place], [place] [address]  Lipase, blood     Status: None   Collection Time: 11/15/18  1:12 PM  Result Value Ref Range   Lipase 25 11 - 51 U/L    Comment: Performed at [facility], [address] [place], [place] [address]  Troponin I - Once     Status: None   Collection Time: 11/15/18  1:12 PM  Result Value Ref Range   Troponin I <0.03 <0.03 ng/mL    Comment: Performed at [facility], [address] [place], [place] [address]  I-stat Creatinine, ED     Status: None   Collection Time: 11/15/18  1:21 PM  Result Value Ref Range   Creatinine, Ser 0.60 0.44 - 1.00 mg/dL  SARS Coronavirus 2 [facility] order, Performed in [facility] hospital lab)     Status: None   Collection Time: 11/15/18  6:10 PM  Result Value Ref Range   SARS Coronavirus 2 NEGATIVE NEGATIVE    Comment: (NOTE) If result is NEGATIVE SARS-CoV-2 target nucleic acids are NOT DETECTED. The SARS-CoV-2 RNA is generally detectable in upper and lower  respiratory specimens during the acute phase of infection. The lowest  concentration of SARS-CoV-2 viral copies this assay can detect is 250  copies / mL. A negative result does not preclude SARS-CoV-2 infection  and should not be used as the sole basis for treatment or other  patient management decisions.  A negative result may occur with  improper specimen collection / handling, submission of specimen other  than nasopharyngeal swab, presence of viral mutation(s) within the  areas targeted by this assay, and inadequate number of viral copies  (<250 copies / mL). A negative result must be combined with clinical  observations, patient history, and epidemiological information. If result is POSITIVE SARS-CoV-2 target nucleic acids are DETECTED. The SARS-CoV-2 RNA is generally detectable in upper and lower  respiratory specimens dur ing the acute phase of infection.   Positive  results are indicative of active infection with SARS-CoV-2.  Clinical  correlation with patient history and other diagnostic information is  necessary to determine patient infection status.  Positive results do  not rule out bacterial infection or co-infection with other viruses. If result is PRESUMPTIVE POSTIVE SARS-CoV-2 nucleic acids MAY BE PRESENT.   A presumptive positive result was obtained on the submitted specimen  and confirmed on repeat testing.  While 2019 novel coronavirus  (SARS-CoV-2) nucleic acids may be present in the submitted sample  additional confirmatory testing may be necessary for epidemiological  and / or clinical management purposes  to differentiate between  SARS-CoV-2 and other Sarbecovirus currently known to infect humans.  If clinically indicated additional testing with an alternate test  methodology [phone]) is advised. The SARS-CoV-2 RNA is generally  detectable in upper and lower respiratory sp ecimens during the acute  phase of infection. The expected result is Negative. Fact Sheet for Patients:  BoilerBrush.com.cy Fact Sheet for Healthcare Providers: https://pope.com/ This test is not yet approved or cleared by the [place] FDA and has been authorized for detection and/or  diagnosis of SARS-CoV-2 by FDA under an Emergency Use Authorization (EUA).  This EUA will remain in effect (meaning this test can be used) for the duration of the COVID-19 declaration under Section 564(b)(1) of the Act, 21 U.S.C. section 360bbb-3(b)(1), unless the authorization is terminated or revoked sooner. Performed at [facility], [address], [place], [place] [address]   Glucose, capillary     Status: Abnormal   Collection Time: 11/15/18  8:57 PM  Result Value Ref Range   Glucose-Capillary 107 (H) 70 - 99 mg/dL  Hemoglobin U0A     Status: Abnormal   Collection Time: 11/16/18  1:51 AM  Result Value  Ref Range   Hgb A1c MFr Bld 6.4 (H) 4.8 - 5.6 %    Comment: (NOTE) Pre diabetes:          5.7%-6.4% Diabetes:              >6.4% Glycemic control for   <7.0% adults with diabetes    Mean Plasma Glucose 136.98 mg/dL    Comment: Performed at [facility], [address], [place], [place] [address]  Comprehensive metabolic panel     Status: Abnormal   Collection Time: 11/16/18  1:51 AM  Result Value Ref Range   Sodium 140 135 - 145 mmol/L   Potassium 3.4 (L) 3.5 - 5.1 mmol/L   Chloride 104 98 - 111 mmol/L   CO2 27 22 - 32 mmol/L   Glucose, Bld 115 (H) 70 - 99 mg/dL   BUN 9 8 - 23 mg/dL   Creatinine, Ser 1.19 0.44 - 1.00 mg/dL   Calcium 8.2 (L) 8.9 - 10.3 mg/dL   Total Protein 6.1 (L) 6.5 - 8.1 g/dL   Albumin 3.1 (L) 3.5 - 5.0 g/dL   AST 15 15 - 41 U/L   ALT 16 0 - 44 U/L   Alkaline Phosphatase 91 38 - 126 U/L   Total Bilirubin 1.1 0.3 - 1.2 mg/dL   GFR calc non Af Amer >60 >60 mL/min   GFR calc Af Amer >60 >60 mL/min   Anion gap 9 5 - 15    Comment: Performed at [facility], [address], [place], [place] [address]  CBC with Differential/Platelet     Status: Abnormal   Collection Time: 11/16/18  1:51 AM  Result Value Ref Range   WBC 7.0 4.0 - 10.5 K/uL   RBC 4.16 3.87 - 5.11 MIL/uL   Hemoglobin 10.6 (L) 12.0 - 15.0 g/dL   HCT 95.6 (L) 21.3 - 08.6 %   MCV 78.1 (L) 80.0 - 100.0 fL   MCH 25.5 (L) 26.0 - 34.0 pg   MCHC 32.6 30.0 - 36.0 g/dL   RDW 57.8 (H) 46.9 - 62.9 %   Platelets 248 150 - 400 K/uL   nRBC 0.0 0.0 - 0.2 %   Neutrophils Relative % 61 %   Neutro Abs 4.3 1.7 - 7.7 K/uL   Lymphocytes Relative 27 %   Lymphs Abs 1.9 0.7 - 4.0 K/uL   Monocytes Relative 9 %   Monocytes Absolute 0.6 0.1 - 1.0 K/uL   Eosinophils Relative 3 %   Eosinophils Absolute 0.2 0.0 - 0.5 K/uL   Basophils Relative 0 %   Basophils Absolute 0.0 0.0 - 0.1 K/uL   Immature Granulocytes 0 %   Abs Immature Granulocytes 0.02 0.00 - 0.07 K/uL    Comment: Performed at [facility], [address] [place],  [place] [address]  Protime-INR     Status: None   Collection Time: 11/16/18  7:13 AM  Result Value Ref Range   Prothrombin Time 14.4 11.4 - 15.2 seconds   INR 1.1 0.8 - 1.2    Comment: (NOTE) INR goal varies based on device and disease states. Performed at [facility], [address] [place], [place] [address]   Glucose, capillary     Status: None   Collection Time: 11/16/18  7:49 AM  Result Value Ref Range   Glucose-Capillary 90 70 - 99 mg/dL  Body fluid cell count with differential     Status: Abnormal   Collection Time: 11/16/18  9:22 AM  Result Value Ref Range   Fluid Type-FCT PERITO    Color, Fluid PINK    Appearance, Fluid HAZY (A) CLEAR   WBC, Fluid 300 0 - 1,000 cu mm   Neutrophil Count, Fluid 73 (H) 0 - 25 %   Lymphs, Fluid 23 %   Monocyte-Macrophage-Serous Fluid 1 (L) 50 - 90 %   Eos, Fluid 3 %    Comment: Performed at [facility], [address], [place], [place] [address]  Gram stain     Status: None   Collection Time: 11/16/18  9:24 AM  Result Value Ref Range   Specimen Description FLUID LIVER CYST    Special Requests NONE    Gram Stain      FEW WBC PRESENT, PREDOMINANTLY MONONUCLEAR NO ORGANISMS SEEN Performed at [facility], [address], [place], [place] [address]    Report Status 11/16/2018 FINAL   Body fluid cell count with differential     Status: Abnormal   Collection Time: 11/16/18 11:13 AM  Result Value Ref Range   Fluid Type-FCT PERITO    Color, Fluid RED    Appearance, Fluid CLOUDY (A) CLEAR   WBC, Fluid 563 0 - 1,000 cu mm   Neutrophil Count, Fluid 16 0 - 25 %   Lymphs, Fluid 77 %   Monocyte-Macrophage-Serous Fluid 5 (L) 50 - 90 %    Comment: Performed at [facility], [address] [place], [place] [address]  Gram stain     Status: None   Collection Time: 11/16/18 11:13 AM  Result Value Ref Range   Specimen Description FLUID LIVER CYST    Special Requests 2    Gram Stain       RARE WBC PRESENT, PREDOMINANTLY MONONUCLEAR NO ORGANISMS SEEN Performed at [facility], [address], [place], [place] [address]    Report Status 11/16/2018 FINAL   Glucose, capillary     Status: None   Collection Time: 11/16/18 12:17 PM  Result Value Ref Range   Glucose-Capillary 86 70 - 99 mg/dL    Ct Aspiration  Result Date: 11/16/2018 INDICATION: Right upper quadrant abdominal pain. Concern for symptomatic hepatic cysts. Please perform image guided hepatic cyst aspiration/drainage catheter placement for diagnostic and therapeutic purposes. EXAM: ULTRASOUND AND CT-GUIDED HEPATIC CYST ASPIRATION X2 COMPARISON:  CT of the chest, abdomen and pelvis - 11/15/2018 MEDICATIONS: The patient is currently admitted to the hospital and receiving intravenous antibiotics. The antibiotics were administered within an appropriate time frame prior to the initiation of the procedure. ANESTHESIA/SEDATION: Moderate (conscious) sedation was employed during this procedure. A total of Versed 2 mg and Fentanyl 100 mcg was administered intravenously. Moderate Sedation Time: 28 minutes. The patient's level of consciousness and vital signs were monitored continuously by radiology nursing throughout  the procedure under my direct supervision. CONTRAST:  None COMPLICATIONS: None immediate. PROCEDURE: Informed written consent was obtained from patient after a discussion of the risks, benefits and alternatives to treatment. The patient was placed supine on the CT gantry and a pre procedural CT was performed re-demonstrating the known multiple hepatic cysts with dominant cyst within the subcapsular aspect of the right lobe of the liver measuring approximately 8.8 x 6.9 cm (image 11, series 2) and additional partially exophytic cyst arising from the medial segment of the left lobe of the liver measuring approximately 5.1 x 4.6 cm (image 35, series 2). The procedures were planned. A timeout was performed prior to the  initiation of the procedure. The skin overlying the right upper abdomen was prepped and draped in the usual sterile fashion. The overlying soft tissues were anesthetized with 1% lidocaine with epinephrine. Under direct ultrasound guidance, the dominant cyst within the dome of the right lobe of the liver was accessed with a 18 gauge trocar needle. Ultrasound image was saved for procedural documentation purposes. An Amplatz wire was coiled within the collection. Appropriate position was confirmed with CT imaging. Next, the trocar needle was exchanged for a Yueh sheath catheter. Approximately 250 cc of blood-tinged serous fluid was aspirated as the catheter was slowly retracted. The identical procedure was repeated for the additional exophytic lesion within the left lobe of the liver yielding 50 cc of serous fluid. Samples from both cysts were sent separately to the laboratory for analysis. Postprocedural CT imaging demonstrates complete resolution of both dominant potentially symptomatic hepatic cysts. Dressings were applied. The patient tolerated the procedure well without immediate post procedural complication. IMPRESSION: 1. Successful ultrasound and CT-guided aspiration 250 cc of blood tinged serous fluid from the dominant cyst within the dome of the right lobe of the liver. 2. Successful ultrasound and CT-guided aspiration of 50 cc of serous fluid from the dominant partially exophytic cyst arising from the left lobe of the liver. 3. Samples from both aspirated cysts were sent separately to the laboratory for analysis. Electronically Signed   By: Simonne Come M.D.   On: 11/16/2018 12:47   Ct Aspiration  Result Date: 11/16/2018 INDICATION: Right upper quadrant abdominal pain. Concern for symptomatic hepatic cysts. Please perform image guided hepatic cyst aspiration/drainage catheter placement for diagnostic and therapeutic purposes. EXAM: ULTRASOUND AND CT-GUIDED HEPATIC CYST ASPIRATION X2 COMPARISON:  CT of the  chest, abdomen and pelvis - 11/15/2018 MEDICATIONS: The patient is currently admitted to the hospital and receiving intravenous antibiotics. The antibiotics were administered within an appropriate time frame prior to the initiation of the procedure. ANESTHESIA/SEDATION: Moderate (conscious) sedation was employed during this procedure. A total of Versed 2 mg and Fentanyl 100 mcg was administered intravenously. Moderate Sedation Time: 28 minutes. The patient's level of consciousness and vital signs were monitored continuously by radiology nursing throughout the procedure under my direct supervision. CONTRAST:  None COMPLICATIONS: None immediate. PROCEDURE: Informed written consent was obtained from patient after a discussion of the risks, benefits and alternatives to treatment. The patient was placed supine on the CT gantry and a pre procedural CT was performed re-demonstrating the known multiple hepatic cysts with dominant cyst within the subcapsular aspect of the right lobe of the liver measuring approximately 8.8 x 6.9 cm (image 11, series 2) and additional partially exophytic cyst arising from the medial segment of the left lobe of the liver measuring approximately 5.1 x 4.6 cm (image 35, series 2). The procedures were planned. A timeout was  performed prior to the initiation of the procedure. The skin overlying the right upper abdomen was prepped and draped in the usual sterile fashion. The overlying soft tissues were anesthetized with 1% lidocaine with epinephrine. Under direct ultrasound guidance, the dominant cyst within the dome of the right lobe of the liver was accessed with a 18 gauge trocar needle. Ultrasound image was saved for procedural documentation purposes. An Amplatz wire was coiled within the collection. Appropriate position was confirmed with CT imaging. Next, the trocar needle was exchanged for a Yueh sheath catheter. Approximately 250 cc of blood-tinged serous fluid was aspirated as the catheter  was slowly retracted. The identical procedure was repeated for the additional exophytic lesion within the left lobe of the liver yielding 50 cc of serous fluid. Samples from both cysts were sent separately to the laboratory for analysis. Postprocedural CT imaging demonstrates complete resolution of both dominant potentially symptomatic hepatic cysts. Dressings were applied. The patient tolerated the procedure well without immediate post procedural complication. IMPRESSION: 1. Successful ultrasound and CT-guided aspiration 250 cc of blood tinged serous fluid from the dominant cyst within the dome of the right lobe of the liver. 2. Successful ultrasound and CT-guided aspiration of 50 cc of serous fluid from the dominant partially exophytic cyst arising from the left lobe of the liver. 3. Samples from both aspirated cysts were sent separately to the laboratory for analysis. Electronically Signed   By: Simonne Come M.D.   On: 11/16/2018 12:47   Dg Chest Port 1 View  Result Date: 11/15/2018 CLINICAL DATA:  Chest pain and shortness of breath EXAM: PORTABLE CHEST 1 VIEW COMPARISON:  09/20/2017. FINDINGS: There is no edema or consolidation. Heart is slightly enlarged with pulmonary vascularity normal. No adenopathy. No bone lesions. No pneumothorax. IMPRESSION: Mild cardiac enlargement.  No edema or consolidation. Electronically Signed   By: Bretta Bang III M.D.   On: 11/15/2018 13:56   Ct Angio Chest/abd/pel For Dissection W And/or W/wo  Result Date: 11/15/2018 CLINICAL DATA:  Right-sided chest and abdominal pain with shortness of breath. EXAM: CT ANGIOGRAPHY CHEST, ABDOMEN AND PELVIS TECHNIQUE: Multidetector CT imaging through the chest, abdomen and pelvis was performed using the standard protocol during bolus administration of intravenous contrast. Multiplanar reconstructed images and MIPs were obtained and reviewed to evaluate the vascular anatomy. CONTRAST:  OMNIPAQUE IOHEXOL 350 MG/ML SOLN  COMPARISON:  CTA chest dated August 11, 2012. CT abdomen pelvis dated Nov 03, 2011. FINDINGS: CTA CHEST FINDINGS Cardiovascular: Preferential opacification of the thoracic aorta. No evidence of thoracic aortic aneurysm or dissection. Unchanged mild cardiomegaly. No pericardial effusion. No pulmonary embolism. Normal caliber pulmonary arteries. Mediastinum/Nodes: No enlarged mediastinal, hilar, or axillary lymph nodes. Thyroid gland, trachea, and esophagus demonstrate no significant findings. Lungs/Pleura: Mild bibasilar atelectasis/scarring. No focal consolidation, pleural effusion, or pneumothorax. No suspicious pulmonary nodule. Musculoskeletal: No chest wall abnormality. No acute or significant osseous findings. Review of the MIP images confirms the above findings. CTA ABDOMEN AND PELVIS FINDINGS VASCULAR Aorta: Normal caliber aorta without aneurysm, dissection, vasculitis or significant stenosis. Celiac: Patent without evidence of aneurysm, dissection, vasculitis or significant stenosis. SMA: Patent without evidence of aneurysm, dissection, vasculitis or significant stenosis. Renals: Both renal arteries are patent without evidence of aneurysm, dissection, vasculitis, fibromuscular dysplasia or significant stenosis. IMA: Patent without evidence of aneurysm, dissection, vasculitis or significant stenosis. Inflow: Patent without evidence of aneurysm, dissection, vasculitis or significant stenosis. Veins: No obvious venous abnormality within the limitations of this arterial phase study. Review of the  MIP images confirms the above findings. NON-VASCULAR Hepatobiliary: Innumerable hepatic simple cysts again noted, the majority of which have increased in size when compared to prior studies. The largest cyst measures up to 8.5 cm. Status post cholecystectomy. No biliary dilatation. Pancreas: Unchanged mild atrophy. No pancreatic ductal dilatation or surrounding inflammatory changes. Spleen: Normal in size without focal  abnormality. Adrenals/Urinary Tract: Adrenal glands are unremarkable. Kidneys are normal, without renal calculi, focal lesion, or hydronephrosis. Bladder is unremarkable. Stomach/Bowel: Unchanged surgical clips near the gastroesophageal junction. Stomach is within normal limits. History of prior appendectomy. No evidence of bowel wall thickening, distention, or inflammatory changes. Diffuse colonic diverticulosis. Lymphatic: No enlarged abdominal or pelvic lymph nodes. Reproductive: Status post hysterectomy. No adnexal masses. Other: Unchanged small fat containing umbilical hernia. No free fluid or pneumoperitoneum. Musculoskeletal: No acute or significant osseous findings. Review of the MIP images confirms the above findings. IMPRESSION: Vascular: 1. No evidence of acute aortic syndrome or thoracoabdominal aneurysm. 2.  No evidence of pulmonary embolism. Chest: 1.  No acute intrathoracic process. Abdomen and pelvis: 1.  No acute intra-abdominal process. Electronically Signed   By: Obie Dredge M.D.   On: 11/15/2018 15:04    ROS negative except above Blood pressure 126/74, pulse 62, temperature 98 F (36.7 C), temperature source Oral, resp. rate 18, height 5\' 8"  (1.727 m), weight 118.8 kg, SpO2 94 %. Physical Exam patient sitting comfortablly in the bed no acute distress vital signs stable abdomen is soft nontender except for over the wounds no guarding or rebound normal bowel sounds labs and CT reviewed as above Assessment/Plan: Seemingly improved pain status post aspiration of enlarging cyst Plan: She will call me next week as needed otherwise call Wednesday for final fluid results if she does not hear from [place] sooner and based on how she is doing I will either see her next week or in a few weeks in the office please call my partner Dr. Bosie Clos if any questions or problems otherwise will expect her to go home tomorrow  [facility] E 11/16/2018, 3:57 PM

## 2018-11-16 NOTE — Progress Notes (Signed)
Patient returned to unit from IR. VS BP (!) 141/66 (BP Location: Left Arm)   Pulse 68   Temp 97.8 F (36.6 C) (Oral)   Resp 16   Ht 5\' 8"  (1.727 m)   Wt 118.8 kg   SpO2 93%   BMI 39.82 kg/m  No complaints of pain. Slight nausea, Zofran administered. Patient settled into bed with call bell in place. Instructed to call for assistance with ambulation. Will continue to monitor for remainder of shift.

## 2018-11-16 NOTE — Procedures (Signed)
Pre Procedure Dx: Symptomatic hepatic cysts Post Procedural Dx: Same  Technically successful CT/US guided aspiration of 250 cc of slightly blood tinged serous fluid from dominant cyst within the dome of the right lobe of the liver.  Technically successful CT/US guided aspiration of 50 cc of serous fluid from dominant cyst within the subcapsular aspect of the left lobe of the liver.  Samples from both collections sent separately to the lab for analysis.   EBL: None  No immediate complications.   Katherina Right, MD Pager #: [phone]

## 2018-11-16 NOTE — Consult Note (Signed)
Chief Complaint: Patient was seen in consultation today for liver cyst aspiration Chief Complaint  Patient presents with  . Flank Pain   at the request of Dr Dorise Bullion  Referring Physician(s): Dr Berton Lan  Supervising Physician: Simonne Come  Patient Status: [facility] - In-pt  History of Present Illness: Jasmine Contreras is a 68 y.o. female    RUQ pain for 5 days Worsening No relief with Tums, etc Post cholecystectomy  CT: Hepatobiliary: Innumerable hepatic simple cysts again noted, the majority of which have increased in size when compared to prior studies. The largest cyst measures up to 8.5 cm. Status post cholecystectomy. No biliary dilatation.  Request for liver cyst aspiration Dr Grace Isaac has reviewed imaging and approves procedure  Past Medical History:  Diagnosis Date  . Acid reflux   . Anxiety   . Arthritis    trochanteric bursitis  . Back pain   . Chest pain   . Cough   . Depression   . Diarrhea   . Difficulty swallowing   . Dizziness   . Double vision   . Dry mouth   . Fatigue   . GERD (gastroesophageal reflux disease)   . Gout   . Headache   . Heart murmur   . Herpes   . Hiatal hernia   . Irritable bowel syndrome   . Itching   . Joint pain   . MVP (mitral valve prolapse)   . Neck stiffness   . Palpitations   . PUD (peptic ulcer disease)    s/p vagotomy  . Retaining fluid   . Ringing in ears   . Shortness of breath   . Stress   . Swelling of extremity   . Swelling of finger   . TIA (transient ischemic attack)   . Varicose veins   . Vertigo   . Vision changes   . Vitamin D deficiency   . Wheezing     Past Surgical History:  Procedure Laterality Date  . BREAST EXCISIONAL BIOPSY Left   . CHOLECYSTECTOMY    . FOOT SURGERY    . partial gastrectomy with vagotomy    . right arthroscopic knee surgery    . trochanteric bursectomy    . VAGINAL HYSTERECTOMY     with oophorectomy    Allergies: Honey bee treatment [bee venom]; Other;  Shellfish-derived products; Strawberry extract; Peanuts [peanut oil]; Abilify [aripiprazole]; Adhesive [tape]; Codeine; Hydrocodone; Latex; Meclizine hcl; Oxycodone hcl; Prednisone; Scopace [scopolamine]; Sertraline; Tavist-d [albertsons dayhist-d]; and Tramadol  Medications: Prior to Admission medications   Medication Sig Start Date End Date Taking? Authorizing Provider  acetaminophen (TYLENOL) 325 MG tablet Take 325-650 mg by mouth every 6 (six) hours as needed (for pain or headaches).   Yes [provider]  BIOTIN FORTE PO Take 1 capsule by mouth every other day.    Yes [provider]  Cholecalciferol (VITAMIN D3) 250 MCG (10000 UT) TABS Take 20,000 Units by mouth 2 (two) times a day.   Yes [provider]  colchicine 0.6 MG tablet Take 0.6 mg by mouth as needed (as directed for gout flares).    Yes [provider]  EPINEPHrine 0.3 mg/0.3 mL IJ SOAJ injection Inject 0.3 mg into the muscle once as needed for anaphylaxis.    Yes [provider]  ferrous gluconate (IRON 27) 240 (27 FE) MG tablet Take 240 mg by mouth daily.   Yes [provider]  hyoscyamine (LEVSIN SL) 0.125 MG SL tablet Place 0.125  mg under the tongue every 4 (four) hours as needed for cramping (or "stomach spasms").    Yes [provider]  indomethacin (INDOCIN) 50 MG capsule Take 50 mg by mouth 2 (two) times daily as needed (as directed for pain/gout flares).    Yes [provider]  metoprolol succinate (TOPROL-XL) 25 MG 24 hr tablet Take 25 mg by mouth at bedtime.  09/24/15  Yes [provider]  nitroGLYCERIN (NITROSTAT) 0.4 MG SL tablet Place 0.4 mg under the tongue every 5 (five) minutes as needed for chest pain.   Yes [provider]  NON FORMULARY Place 2 drops under the tongue See admin instructions. CBD oil: Place 2 drops under the tongue at bedtime as needed for sleep   Yes [provider]  ofloxacin (FLOXIN) 0.3 % OTIC  solution See admin instructions. Instill as directed for infections   Yes [provider]  pantoprazole (PROTONIX) 40 MG tablet Take 30- 60 min before your first and last meals of the day Patient taking differently: Take 40 mg by mouth See admin instructions. Take 40 mg by mouth 30- 60 minutes before breakfast and supper (evening meal) 01/16/18  Yes Nyoka Cowden, MD  potassium chloride SA (K-DUR,KLOR-CON) 20 MEQ tablet Take 20 mEq by mouth 2 (two) times daily.    Yes [provider]  scopolamine (TRANSDERM-SCOP) 1 MG/3DAYS Place 1 patch onto the skin every 3 (three) days as needed (for motion sickness).    Yes [provider]  torsemide (DEMADEX) 20 MG tablet Take 30 mg by mouth daily.   Yes [provider]  vitamin B-12 (CYANOCOBALAMIN) 1000 MCG tablet Take 1,000 mcg by mouth 2 (two) times a week.    Yes [provider]  metFORMIN (GLUCOPHAGE) 500 MG tablet Take 1 tablet (500 mg total) by mouth daily with breakfast. Patient not taking: Reported on 11/15/2018 08/27/18   Wilder Glade, MD     Family History  Problem Relation Age of Onset  . Diabetes Mother   . Hypertension Mother   . Kidney disease Mother   . Alcohol abuse Father   . Hyperlipidemia Father   . Hypertension Father   . Stroke Father   . Depression Father   . Sleep apnea Father     Social History   Socioeconomic History  . Marital status: Married    Spouse name: Fayrene Fearing  . Number of children: 3  . Years of education: College  . Highest education level: Not on file  Occupational History  . Occupation: Retired   Engineer, production  . Financial resource strain: Not on file  . Food insecurity:    Worry: Not on file    Inability: Not on file  . Transportation needs:    Medical: Not on file    Non-medical: Not on file  Tobacco Use  . Smoking status: Never Smoker  . Smokeless tobacco: Never Used  Substance and Sexual Activity  . Alcohol use: Yes    Alcohol/week: 0.0 standard  drinks    Comment: 1 glass of wine with dinner  . Drug use: No  . Sexual activity: Not Currently    Birth control/protection: Surgical  Lifestyle  . Physical activity:    Days per week: Not on file    Minutes per session: Not on file  . Stress: Not on file  Relationships  . Social connections:    Talks on phone: Not on file    Gets together: Not on file  Attends religious service: Not on file    Active member of club or organization: Not on file    Attends meetings of clubs or organizations: Not on file    Relationship status: Not on file  Other Topics Concern  . Not on file  Social History Narrative   Patient drinks a pepsi or glass of tea in the morning.      Review of Systems: A 12 point ROS discussed and pertinent positives are indicated in the HPI above.  All other systems are negative.  Review of Systems  Constitutional: Positive for activity change and appetite change. Negative for fatigue and fever.  Respiratory: Negative for shortness of breath.   Cardiovascular: Negative for chest pain.  Gastrointestinal: Positive for abdominal pain and nausea.  Psychiatric/Behavioral: Negative for behavioral problems and confusion.    Vital Signs: BP 137/73 (BP Location: Left Arm)   Pulse 69   Temp 98 F (36.7 C) (Oral)   Resp 14   Ht 5\' 8"  (1.727 m)   Wt 261 lb 14.5 oz (118.8 kg)   SpO2 94%   BMI 39.82 kg/m   Physical Exam Vitals signs reviewed.  Cardiovascular:     Rate and Rhythm: Normal rate and regular rhythm.     Heart sounds: Normal heart sounds.  Pulmonary:     Breath sounds: Normal breath sounds.  Abdominal:     General: Bowel sounds are normal.     Tenderness: There is abdominal tenderness.  Musculoskeletal: Normal range of motion.  Skin:    General: Skin is warm and dry.  Neurological:     Mental Status: She is alert and oriented to person, place, and time.  Psychiatric:        Mood and Affect: Mood normal.        Behavior: Behavior normal.         Thought Content: Thought content normal.        Judgment: Judgment normal.     Imaging: Dg Chest Port 1 View  Result Date: 11/15/2018 CLINICAL DATA:  Chest pain and shortness of breath EXAM: PORTABLE CHEST 1 VIEW COMPARISON:  09/20/2017. FINDINGS: There is no edema or consolidation. Heart is slightly enlarged with pulmonary vascularity normal. No adenopathy. No bone lesions. No pneumothorax. IMPRESSION: Mild cardiac enlargement.  No edema or consolidation. Electronically Signed   By: Bretta BangWilliam  Woodruff III M.D.   On: 11/15/2018 13:56   Ct Angio Chest/abd/pel For Dissection W And/or W/wo  Result Date: 11/15/2018 CLINICAL DATA:  Right-sided chest and abdominal pain with shortness of breath. EXAM: CT ANGIOGRAPHY CHEST, ABDOMEN AND PELVIS TECHNIQUE: Multidetector CT imaging through the chest, abdomen and pelvis was performed using the standard protocol during bolus administration of intravenous contrast. Multiplanar reconstructed images and MIPs were obtained and reviewed to evaluate the vascular anatomy. CONTRAST:  100mL OMNIPAQUE IOHEXOL 350 MG/ML SOLN COMPARISON:  CTA chest dated August 11, 2012. CT abdomen pelvis dated Nov 03, 2011. FINDINGS: CTA CHEST FINDINGS Cardiovascular: Preferential opacification of the thoracic aorta. No evidence of thoracic aortic aneurysm or dissection. Unchanged mild cardiomegaly. No pericardial effusion. No pulmonary embolism. Normal caliber pulmonary arteries. Mediastinum/Nodes: No enlarged mediastinal, hilar, or axillary lymph nodes. Thyroid gland, trachea, and esophagus demonstrate no significant findings. Lungs/Pleura: Mild bibasilar atelectasis/scarring. No focal consolidation, pleural effusion, or pneumothorax. No suspicious pulmonary nodule. Musculoskeletal: No chest wall abnormality. No acute or significant osseous findings. Review of the MIP images confirms the above findings. CTA ABDOMEN AND PELVIS FINDINGS VASCULAR Aorta: Normal caliber aorta without  aneurysm,  dissection, vasculitis or significant stenosis. Celiac: Patent without evidence of aneurysm, dissection, vasculitis or significant stenosis. SMA: Patent without evidence of aneurysm, dissection, vasculitis or significant stenosis. Renals: Both renal arteries are patent without evidence of aneurysm, dissection, vasculitis, fibromuscular dysplasia or significant stenosis. IMA: Patent without evidence of aneurysm, dissection, vasculitis or significant stenosis. Inflow: Patent without evidence of aneurysm, dissection, vasculitis or significant stenosis. Veins: No obvious venous abnormality within the limitations of this arterial phase study. Review of the MIP images confirms the above findings. NON-VASCULAR Hepatobiliary: Innumerable hepatic simple cysts again noted, the majority of which have increased in size when compared to prior studies. The largest cyst measures up to 8.5 cm. Status post cholecystectomy. No biliary dilatation. Pancreas: Unchanged mild atrophy. No pancreatic ductal dilatation or surrounding inflammatory changes. Spleen: Normal in size without focal abnormality. Adrenals/Urinary Tract: Adrenal glands are unremarkable. Kidneys are normal, without renal calculi, focal lesion, or hydronephrosis. Bladder is unremarkable. Stomach/Bowel: Unchanged surgical clips near the gastroesophageal junction. Stomach is within normal limits. History of prior appendectomy. No evidence of bowel wall thickening, distention, or inflammatory changes. Diffuse colonic diverticulosis. Lymphatic: No enlarged abdominal or pelvic lymph nodes. Reproductive: Status post hysterectomy. No adnexal masses. Other: Unchanged small fat containing umbilical hernia. No free fluid or pneumoperitoneum. Musculoskeletal: No acute or significant osseous findings. Review of the MIP images confirms the above findings. IMPRESSION: Vascular: 1. No evidence of acute aortic syndrome or thoracoabdominal aneurysm. 2.  No evidence of pulmonary  embolism. Chest: 1.  No acute intrathoracic process. Abdomen and pelvis: 1.  No acute intra-abdominal process. Electronically Signed   By: Obie Dredge M.D.   On: 11/15/2018 15:04    Labs:  CBC: Recent Labs    01/16/18 1652 11/15/18 1312 11/16/18 0151  WBC 9.1 7.4 7.0  HGB 12.3 12.2 10.6*  HCT 37.6 38.7 32.5*  PLT 275.0 278 248    COAGS: Recent Labs    11/16/18 0713  INR 1.1    BMP: Recent Labs    03/28/18 1032 08/01/18 1303 11/15/18 1312 11/15/18 1321 11/16/18 0151  NA 141 139 141  --  140  K 3.7 3.7 3.3*  --  3.4*  CL 100 100 102  --  104  CO2 27 23 25   --  27  GLUCOSE 87 119* 93  --  115*  BUN 9 5* 10  --  9  CALCIUM 8.8 9.2 9.5  --  8.2*  CREATININE 0.70 0.90 0.76 0.60 0.94  GFRNONAA 90 66 >60  --  >60  GFRAA 104 77 >60  --  >60    LIVER FUNCTION TESTS: Recent Labs    03/28/18 1032 08/01/18 1303 11/15/18 1312 11/16/18 0151  BILITOT 0.9 0.5 1.1 1.1  AST 15 16 23 15   ALT 16 19 19 16   ALKPHOS 105 108 100 91  PROT 6.9 7.2 8.2* 6.1*  ALBUMIN 4.2 4.4 3.9 3.1*    TUMOR MARKERS: No results for input(s): AFPTM, CEA, CA199, CHROMGRNA in the last 8760 hours.  Assessment and Plan:  RUQ pain Liver cysts on imaging Plan for liver cyst aspiration in IR Risks and benefits of liver cyst aspiration was discussed with the patient and/or patient's family including, but not limited to bleeding, infection, damage to adjacent structures or low yield requiring additional tests.  All of the questions were answered and there is agreement to proceed. Consent signed and in chart.  Thank you for this interesting consult.  I greatly enjoyed meeting Jasmine Contreras  and look forward to participating in their care.  A copy of this report was sent to the requesting provider on this date.  Electronically Signed: Robet Leu, PA-C 11/16/2018, 9:27 AM   I spent a total of 40 Minutes    in face to face in clinical consultation, greater than 50% of which was  counseling/coordinating care for liver cyst aspiration

## 2018-11-17 LAB — CBC WITH DIFFERENTIAL/PLATELET
Abs Immature Granulocytes: 0.01 10*3/uL (ref 0.00–0.07)
Basophils Absolute: 0 10*3/uL (ref 0.0–0.1)
Basophils Relative: 0 %
Eosinophils Absolute: 0.1 10*3/uL (ref 0.0–0.5)
Eosinophils Relative: 2 %
HCT: 34.5 % — ABNORMAL LOW (ref 36.0–46.0)
Hemoglobin: 11.2 g/dL — ABNORMAL LOW (ref 12.0–15.0)
Immature Granulocytes: 0 %
Lymphocytes Relative: 22 %
Lymphs Abs: 1.4 10*3/uL (ref 0.7–4.0)
MCH: 25.5 pg — ABNORMAL LOW (ref 26.0–34.0)
MCHC: 32.5 g/dL (ref 30.0–36.0)
MCV: 78.6 fL — ABNORMAL LOW (ref 80.0–100.0)
Monocytes Absolute: 0.5 10*3/uL (ref 0.1–1.0)
Monocytes Relative: 8 %
Neutro Abs: 4.5 10*3/uL (ref 1.7–7.7)
Neutrophils Relative %: 68 %
Platelets: 224 10*3/uL (ref 150–400)
RBC: 4.39 MIL/uL (ref 3.87–5.11)
RDW: 16.1 % — ABNORMAL HIGH (ref 11.5–15.5)
WBC: 6.6 10*3/uL (ref 4.0–10.5)
nRBC: 0 % (ref 0.0–0.2)

## 2018-11-17 LAB — COMPREHENSIVE METABOLIC PANEL
ALT: 26 U/L (ref 0–44)
AST: 29 U/L (ref 15–41)
Albumin: 3.1 g/dL — ABNORMAL LOW (ref 3.5–5.0)
Alkaline Phosphatase: 89 U/L (ref 38–126)
Anion gap: 10 (ref 5–15)
BUN: 12 mg/dL (ref 8–23)
CO2: 27 mmol/L (ref 22–32)
Calcium: 8.4 mg/dL — ABNORMAL LOW (ref 8.9–10.3)
Chloride: 104 mmol/L (ref 98–111)
Creatinine, Ser: 0.85 mg/dL (ref 0.44–1.00)
GFR calc Af Amer: >60 mL/min (ref >60)
GFR calc non Af Amer: >60 mL/min (ref >60)
Glucose, Bld: 125 mg/dL — ABNORMAL HIGH (ref 70–99)
Potassium: 3.4 mmol/L — ABNORMAL LOW (ref 3.5–5.1)
Sodium: 141 mmol/L (ref 135–145)
Total Bilirubin: 1 mg/dL (ref 0.3–1.2)
Total Protein: 6.5 g/dL (ref 6.5–8.1)

## 2018-11-17 LAB — MAGNESIUM: Magnesium: 1.7 mg/dL (ref 1.7–2.4)

## 2018-11-17 MED ORDER — POTASSIUM CHLORIDE CRYS ER 20 MEQ PO TBCR
40.0000 meq | EXTENDED_RELEASE_TABLET | Freq: Once | ORAL | Status: AC
  Administered 2018-11-17: 40 meq via ORAL
  Filled 2018-11-17: qty 2

## 2018-11-17 MED ORDER — MAGNESIUM OXIDE 400 (241.3 MG) MG PO TABS
400.0000 mg | ORAL_TABLET | Freq: Every day | ORAL | Status: DC
  Administered 2018-11-17: 400 mg via ORAL
  Filled 2018-11-17: qty 1

## 2018-11-17 NOTE — Progress Notes (Signed)
Discharged pt to home. Pt is alert, oriented and stable. No signs of distress. Instructions given and explained and belongings were returned accordingly.

## 2018-11-17 NOTE — Discharge Summary (Addendum)
Discharge Summary  Jasmine Contreras AOZ:[id number] DOB: 1950-12-26  PCP: Laurann Montana, MD  Admit date: 11/15/2018 Discharge date: 11/17/2018  Time spent: .  Recommendations for Outpatient Follow-up:  1. F/u with PCP within a week  for hospital discharge follow up, repeat cbc/bmp at follow up 2. F/u with GI Dr Ewing Schlein  Discharge Diagnoses:  Active Hospital Problems   Diagnosis Date Noted   Right upper quadrant abdominal pain    Hypokalemia    Obesity (BMI 30-39.9)    Liver cyst 11/15/2018    Resolved Hospital Problems  No resolved problems to display.    Discharge Condition: stable  Diet recommendation: heart healthy/carb modified  Filed Weights   11/15/18 1255 11/17/18 0500  Weight: 118.8 kg 118 kg    History of present illness: (per admitting MD Dr Margo Aye) PCP: Laurann Montana, MD  Outpatient Specialists: None Patient coming from: Home  Chief Complaint: Severe abdominal pain  HPI: Jasmine Contreras is a 68 y.o. female with medical history significant for hypertension, type 2 diabetes, gout, chronic diastolic CHF, GERD/PUD status post vagotomy, TIA, mitral valve prolapse, post cholecystectomy, who presented to [facility] ED with complaints of severe right upper quadrant abdominal pain of 5 days duration.  Patient states symptoms started on Sunday after eating a hot dog and gradually worsened.  She has been having intermittent sharp right upper quadrant abdominal pain improved with belching worsened by certain position or certain foods.  Her pain radiates into her right shoulder blade.  Denies fevers or chills.  She contacted her primary care provider who recommended that she comes to the ED for further evaluation.  ED Course: On presentation to the ED vital signs and lab studies stable.  CT angios chest/abdomen and pelvis revealed innumerable hepatic cysts the majority of which have increased in size when compared to prior studies.  The largest cyst measures up to 8.5  cm.  No biliary dilatation.  No evidence of pulmonary embolism, no evidence of acute aortic syndrome or thoracoabdominal aneurysm.  ED contacted Dr. Ewing Schlein [facility]) her GI physician who recommended liver cyst drainage by interventional radiology.  TRH called to admit.   Hospital Course:  Active Problems:   Liver cyst   Right upper quadrant abdominal pain   Hypokalemia   Obesity (BMI 30-39.9)   Innumerable liver cysts with largest measuring 8.5 cm, LFTs unremarkable Severe right upper quadrant pain likely secondary to large liver cyst measuring up to 8.5 cm Dr. Erskine Speed Enid Baas contacted by ED provider who recommended liver cyst drainage S/p CT/US guided aspiration of two cysts: 250cc slightly blood tinged seroud fluids from dominant  cyst within the dome of the right lobe of the liver. aspiration of 50 cc of serous fluid from dominant cyst within the subcapsular aspect of the left lobe of the liver. Fluids study gram stain negative for organism, culture and cytology in process Required fentanyl for pain control initially, pain has much improved after aspiration. Case discussed with DR Dr Ewing Schlein and IR Dr Grace Isaac Appreciate GI and IR input She is to follow up with GI in 1-2 weeks, f/u with IR as needed.    Hypokalemia Potassium 3.3, Repleted Mag 1.7, repleted  Chronic diastolic CHF, per chart she denies h/o heart failure She reports on demadex for ankle edema, she reports has been on metoprolol for palpitation but not for heart disease or HTN, she reports has not used sublingual nitro for the last 33yrs She reports used to see cardiology Dr Donnie Aho  She  does not want heart healthy diet in the hospital  GERD/PUD post vagotomy Continue PPI  Prediabetes She is on metformin hemoglobin A1c 6.4 She dose not want to have frequent FSBG checked, she does not want sliding scale insulin She agrees to carb modified diet  Obesity, she is [facility] Body mass index is 39.82 kg/m.    Code Status: full  Family Communication: patient   Disposition Plan: home    Consultants:  IR  [facility] Dr Ewing Schlein  Procedures:  CT/Us guided liver cyst aspiration  Antibiotics:  none   Discharge Exam: BP 140/77 (BP Location: Left Arm)    Pulse 67    Temp 98.2 F (36.8 C) (Oral)    Resp 18    Ht  (1.727 m)    Wt 118 kg    SpO2 93%    BMI 39.55 kg/m   General: NAD Cardiovascular: RRR, soft murmur right upper sternal border Respiratory: CTABL Ab: nontender, +bs Extremity: no edema  Discharge Instructions You were cared for by a hospitalist during your hospital stay. If you have any questions about your discharge medications or the care you received while you were in the hospital after you are discharged, you can call the unit and asked to speak with the hospitalist on call if the hospitalist that took care of you is not available. Once you are discharged, your primary care physician will handle any further medical issues. Please note that NO REFILLS for any discharge medications will be authorized once you are discharged, as it is imperative that you return to your primary care physician (or establish a relationship with a primary care physician if you do not have one) for your aftercare needs so that they can reassess your need for medications and monitor your lab values.  Discharge Instructions    Diet - low sodium heart healthy   Complete by:  As directed    Carb modified diet   Increase activity slowly   Complete by:  As directed      Allergies as of 11/17/2018      Reactions   Honey Bee Treatment [bee Venom] Anaphylaxis, Swelling   Body swells   Other Other (See Comments)   Has diverticulitis- cannot have ANY FOODS WITH SEEDS!!   Shellfish-derived Products Anaphylaxis   Strawberry Extract Anaphylaxis   Peanuts [peanut Oil] Hives   Abilify [aripiprazole] Other (See Comments)   Somnolence    Adhesive [tape] Hives, Itching   Codeine Itching, Nausea And Vomiting   Hydrocodone Itching, Nausea And Vomiting   Latex Hives, Itching   Meclizine Hcl Nausea Only   Oxycodone Hcl Itching, Nausea And Vomiting   Prednisone Other (See Comments), Hypertension   "Makes my face turn red, also"   Scopace [scopolamine] Nausea Only   Sertraline Other (See Comments)   Suicidal thoughts   Tavist-d [albertsons Dayhist-d] Other (See Comments)   Makes my nose drain   Tramadol Nausea Only      Medication List    TAKE these medications   acetaminophen 325 MG tablet Commonly known as:  TYLENOL Take 325-650 mg by mouth every 6 (six) hours as needed (for pain or headaches).   BIOTIN FORTE PO Take 1 capsule by mouth every other day.   colchicine 0.6 MG tablet Take 0.6 mg by mouth as needed (as directed for gout flares).   EPINEPHrine 0.3 mg/0.3 mL Soaj injection Commonly known as:  EPI-PEN Inject 0.3 mg into the  muscle once as needed for anaphylaxis.   hyoscyamine 0.125 MG SL tablet Commonly known as:  LEVSIN SL Place 0.125 mg under the tongue every 4 (four) hours as needed for cramping (or "stomach spasms").   indomethacin 50 MG capsule Commonly known as:  INDOCIN Take 50 mg by mouth 2 (two) times daily as needed (as directed for pain/gout flares).   Iron 27 240 (27 FE) MG tablet Generic drug:  ferrous gluconate Take 240 mg by mouth daily.   metFORMIN 500 MG tablet Commonly known as:  GLUCOPHAGE Take 1 tablet (500 mg total) by mouth daily with breakfast.   metoprolol succinate 25 MG 24 hr tablet Commonly known as:  TOPROL-XL Take 25 mg by mouth at bedtime.   nitroGLYCERIN 0.4 MG SL tablet Commonly known as:  NITROSTAT Place 0.4 mg under the tongue every 5 (five) minutes as needed for chest pain.   NON FORMULARY Place 2 drops under the tongue See admin instructions. CBD oil: Place 2 drops under the tongue at bedtime as needed for sleep   ofloxacin 0.3 % OTIC  solution Commonly known as:  FLOXIN See admin instructions. Instill as directed for infections   pantoprazole 40 MG tablet Commonly known as:  Protonix Take 30- 60 min before your first and last meals of the day What changed:    how much to take  how to take this  when to take this  additional instructions   potassium chloride SA 20 MEQ tablet Commonly known as:  K-DUR Take 20 mEq by mouth 2 (two) times daily.   scopolamine 1 MG/3DAYS Commonly known as:  TRANSDERM-SCOP Place 1 patch onto the skin every 3 (three) days as needed (for motion sickness).   torsemide 20 MG tablet Commonly known as:  DEMADEX Take 30 mg by mouth daily.   vitamin B-12 1000 MCG tablet Commonly known as:  CYANOCOBALAMIN Take 1,000 mcg by mouth 2 (two) times a week.   Vitamin D3 250 MCG (10000 UT) Tabs Take 20,000 Units by mouth 2 (two) times a day.      Allergies  Allergen Reactions   Honey Bee Treatment [Bee Venom] Anaphylaxis and Swelling    Body swells   Other Other (See Comments)    Has diverticulitis- cannot have ANY FOODS WITH SEEDS!!   Shellfish-Derived Products Anaphylaxis   Strawberry Extract Anaphylaxis   Peanuts [Peanut Oil] Hives   Abilify [Aripiprazole] Other (See Comments)    Somnolence     Adhesive [Tape] Hives and Itching   Codeine Itching and Nausea And Vomiting   Hydrocodone Itching and Nausea And Vomiting   Latex Hives and Itching   Meclizine Hcl Nausea Only   Oxycodone Hcl Itching and Nausea And Vomiting   Prednisone Other (See Comments) and Hypertension    "Makes my face turn red, also"   Scopace [Scopolamine] Nausea Only   Sertraline Other (See Comments)    Suicidal thoughts    Tavist-D [Albertsons Dayhist-D] Other (See Comments)    Makes my nose drain   Tramadol Nausea Only   Follow-up Information    Laurann Montana, MD Follow up.   Specialty:  Family Medicine Contact information: [address] [place]  [address] [phone]        Vida Rigger, MD Follow up in 1 week(s).   Specialty:  Gastroenterology Contact information: [address] [place] [address] [phone]            The results of significant diagnostics  from this hospitalization (including imaging, microbiology, ancillary and laboratory) are listed below for reference.    Significant Diagnostic Studies: Ct Aspiration  Result Date: 11/16/2018 INDICATION: Right upper quadrant abdominal pain. Concern for symptomatic hepatic cysts. Please perform image guided hepatic cyst aspiration/drainage catheter placement for diagnostic and therapeutic purposes. EXAM: ULTRASOUND AND CT-GUIDED HEPATIC CYST ASPIRATION X2 COMPARISON:  CT of the chest, abdomen and pelvis - 11/15/2018 MEDICATIONS: The patient is currently admitted to the hospital and receiving intravenous antibiotics. The antibiotics were administered within an appropriate time frame prior to the initiation of the procedure. ANESTHESIA/SEDATION: Moderate (conscious) sedation was employed during this procedure. A total of Versed 2 mg and Fentanyl 100 mcg was administered intravenously. Moderate Sedation Time: 28 minutes. The patient's level of consciousness and vital signs were monitored continuously by radiology nursing throughout the procedure under my direct supervision. CONTRAST:  None COMPLICATIONS: None immediate. PROCEDURE: Informed written consent was obtained from patient after a discussion of the risks, benefits and alternatives to treatment. The patient was placed supine on the CT gantry and a pre procedural CT was performed re-demonstrating the known multiple hepatic cysts with dominant cyst within the subcapsular aspect of the right lobe of the liver measuring approximately 8.8 x 6.9 cm (image 11, series 2) and additional partially exophytic cyst arising from the medial segment of the left lobe of the liver measuring approximately 5.1 x 4.6 cm (image 35,  series 2). The procedures were planned. A timeout was performed prior to the initiation of the procedure. The skin overlying the right upper abdomen was prepped and draped in the usual sterile fashion. The overlying soft tissues were anesthetized with 1% lidocaine with epinephrine. Under direct ultrasound guidance, the dominant cyst within the dome of the right lobe of the liver was accessed with a 18 gauge trocar needle. Ultrasound image was saved for procedural documentation purposes. An Amplatz wire was coiled within the collection. Appropriate position was confirmed with CT imaging. Next, the trocar needle was exchanged for a Yueh sheath catheter. Approximately 250 cc of blood-tinged serous fluid was aspirated as the catheter was slowly retracted. The identical procedure was repeated for the additional exophytic lesion within the left lobe of the liver yielding 50 cc of serous fluid. Samples from both cysts were sent separately to the laboratory for analysis. Postprocedural CT imaging demonstrates complete resolution of both dominant potentially symptomatic hepatic cysts. Dressings were applied. The patient tolerated the procedure well without immediate post procedural complication. IMPRESSION: 1. Successful ultrasound and CT-guided aspiration 250 cc of blood tinged serous fluid from the dominant cyst within the dome of the right lobe of the liver. 2. Successful ultrasound and CT-guided aspiration of 50 cc of serous fluid from the dominant partially exophytic cyst arising from the left lobe of the liver. 3. Samples from both aspirated cysts were sent separately to the laboratory for analysis. Electronically Signed   By: Simonne Come M.D.   On: 11/16/2018 12:47   Ct Aspiration  Result Date: 11/16/2018 INDICATION: Right upper quadrant abdominal pain. Concern for symptomatic hepatic cysts. Please perform image guided hepatic cyst aspiration/drainage catheter placement for diagnostic and therapeutic purposes.  EXAM: ULTRASOUND AND CT-GUIDED HEPATIC CYST ASPIRATION X2 COMPARISON:  CT of the chest, abdomen and pelvis - 11/15/2018 MEDICATIONS: The patient is currently admitted to the hospital and receiving intravenous antibiotics. The antibiotics were administered within an appropriate time frame prior to the initiation of the procedure. ANESTHESIA/SEDATION: Moderate (conscious) sedation was employed during this procedure. A total of  Versed 2 mg and Fentanyl 100 mcg was administered intravenously. Moderate Sedation Time: 28 minutes. The patient's level of consciousness and vital signs were monitored continuously by radiology nursing throughout the procedure under my direct supervision. CONTRAST:  None COMPLICATIONS: None immediate. PROCEDURE: Informed written consent was obtained from patient after a discussion of the risks, benefits and alternatives to treatment. The patient was placed supine on the CT gantry and a pre procedural CT was performed re-demonstrating the known multiple hepatic cysts with dominant cyst within the subcapsular aspect of the right lobe of the liver measuring approximately 8.8 x 6.9 cm (image 11, series 2) and additional partially exophytic cyst arising from the medial segment of the left lobe of the liver measuring approximately 5.1 x 4.6 cm (image 35, series 2). The procedures were planned. A timeout was performed prior to the initiation of the procedure. The skin overlying the right upper abdomen was prepped and draped in the usual sterile fashion. The overlying soft tissues were anesthetized with 1% lidocaine with epinephrine. Under direct ultrasound guidance, the dominant cyst within the dome of the right lobe of the liver was accessed with a 18 gauge trocar needle. Ultrasound image was saved for procedural documentation purposes. An Amplatz wire was coiled within the collection. Appropriate position was confirmed with CT imaging. Next, the trocar needle was exchanged for a Yueh sheath  catheter. Approximately 250 cc of blood-tinged serous fluid was aspirated as the catheter was slowly retracted. The identical procedure was repeated for the additional exophytic lesion within the left lobe of the liver yielding 50 cc of serous fluid. Samples from both cysts were sent separately to the laboratory for analysis. Postprocedural CT imaging demonstrates complete resolution of both dominant potentially symptomatic hepatic cysts. Dressings were applied. The patient tolerated the procedure well without immediate post procedural complication. IMPRESSION: 1. Successful ultrasound and CT-guided aspiration 250 cc of blood tinged serous fluid from the dominant cyst within the dome of the right lobe of the liver. 2. Successful ultrasound and CT-guided aspiration of 50 cc of serous fluid from the dominant partially exophytic cyst arising from the left lobe of the liver. 3. Samples from both aspirated cysts were sent separately to the laboratory for analysis. Electronically Signed   By: Simonne Come M.D.   On: 11/16/2018 12:47   Dg Chest Port 1 View  Result Date: 11/15/2018 CLINICAL DATA:  Chest pain and shortness of breath EXAM: PORTABLE CHEST 1 VIEW COMPARISON:  09/20/2017. FINDINGS: There is no edema or consolidation. Heart is slightly enlarged with pulmonary vascularity normal. No adenopathy. No bone lesions. No pneumothorax. IMPRESSION: Mild cardiac enlargement.  No edema or consolidation. Electronically Signed   By: Bretta Bang III M.D.   On: 11/15/2018 13:56   Ct Angio Chest/abd/pel For Dissection W And/or W/wo  Result Date: 11/15/2018 CLINICAL DATA:  Right-sided chest and abdominal pain with shortness of breath. EXAM: CT ANGIOGRAPHY CHEST, ABDOMEN AND PELVIS TECHNIQUE: Multidetector CT imaging through the chest, abdomen and pelvis was performed using the standard protocol during bolus administration of intravenous contrast. Multiplanar reconstructed images and MIPs were obtained and reviewed to  evaluate the vascular anatomy. CONTRAST:  OMNIPAQUE IOHEXOL 350 MG/ML SOLN COMPARISON:  CTA chest dated August 11, 2012. CT abdomen pelvis dated Nov 03, 2011. FINDINGS: CTA CHEST FINDINGS Cardiovascular: Preferential opacification of the thoracic aorta. No evidence of thoracic aortic aneurysm or dissection. Unchanged mild cardiomegaly. No pericardial effusion. No pulmonary embolism. Normal caliber pulmonary arteries. Mediastinum/Nodes: No enlarged mediastinal, hilar, or  axillary lymph nodes. Thyroid gland, trachea, and esophagus demonstrate no significant findings. Lungs/Pleura: Mild bibasilar atelectasis/scarring. No focal consolidation, pleural effusion, or pneumothorax. No suspicious pulmonary nodule. Musculoskeletal: No chest wall abnormality. No acute or significant osseous findings. Review of the MIP images confirms the above findings. CTA ABDOMEN AND PELVIS FINDINGS VASCULAR Aorta: Normal caliber aorta without aneurysm, dissection, vasculitis or significant stenosis. Celiac: Patent without evidence of aneurysm, dissection, vasculitis or significant stenosis. SMA: Patent without evidence of aneurysm, dissection, vasculitis or significant stenosis. Renals: Both renal arteries are patent without evidence of aneurysm, dissection, vasculitis, fibromuscular dysplasia or significant stenosis. IMA: Patent without evidence of aneurysm, dissection, vasculitis or significant stenosis. Inflow: Patent without evidence of aneurysm, dissection, vasculitis or significant stenosis. Veins: No obvious venous abnormality within the limitations of this arterial phase study. Review of the MIP images confirms the above findings. NON-VASCULAR Hepatobiliary: Innumerable hepatic simple cysts again noted, the majority of which have increased in size when compared to prior studies. The largest cyst measures up to 8.5 cm. Status post cholecystectomy. No biliary dilatation. Pancreas: Unchanged mild atrophy. No pancreatic ductal  dilatation or surrounding inflammatory changes. Spleen: Normal in size without focal abnormality. Adrenals/Urinary Tract: Adrenal glands are unremarkable. Kidneys are normal, without renal calculi, focal lesion, or hydronephrosis. Bladder is unremarkable. Stomach/Bowel: Unchanged surgical clips near the gastroesophageal junction. Stomach is within normal limits. History of prior appendectomy. No evidence of bowel wall thickening, distention, or inflammatory changes. Diffuse colonic diverticulosis. Lymphatic: No enlarged abdominal or pelvic lymph nodes. Reproductive: Status post hysterectomy. No adnexal masses. Other: Unchanged small fat containing umbilical hernia. No free fluid or pneumoperitoneum. Musculoskeletal: No acute or significant osseous findings. Review of the MIP images confirms the above findings. IMPRESSION: Vascular: 1. No evidence of acute aortic syndrome or thoracoabdominal aneurysm. 2.  No evidence of pulmonary embolism. Chest: 1.  No acute intrathoracic process. Abdomen and pelvis: 1.  No acute intra-abdominal process. Electronically Signed   By: Obie DredgeWilliam T Derry M.D.   On: 11/15/2018 15:04    Microbiology: Recent Results (from the past 240 hour(s))  SARS Coronavirus 2 [facility](Hospital order, Performed [facility] lab)     Status: None   Collection Time: 11/15/18  6:10 PM  Result Value Ref Range Status   SARS Coronavirus 2 NEGATIVE NEGATIVE Final    Comment: (NOTE) If result is NEGATIVE SARS-CoV-2 target nucleic acids are NOT DETECTED. The SARS-CoV-2 RNA is generally detectable in upper and lower  respiratory specimens during the acute phase of infection. The lowest  concentration of SARS-CoV-2 viral copies this assay can detect is 250  copies / mL. A negative result does not preclude SARS-CoV-2 infection  and should not be used as the sole basis for treatment or other  patient management decisions.  A negative result may occur with  improper specimen collection / handling,  submission of specimen other  than nasopharyngeal swab, presence of viral mutation(s) within the  areas targeted by this assay, and inadequate number of viral copies  (<250 copies / mL). A negative result must be combined with clinical  observations, patient history, and epidemiological information. If result is POSITIVE SARS-CoV-2 target nucleic acids are DETECTED. The SARS-CoV-2 RNA is generally detectable in upper and lower  respiratory specimens dur ing the acute phase of infection.  Positive  results are indicative of active infection with SARS-CoV-2.  Clinical  correlation with patient history and other diagnostic information is  necessary to determine patient infection status.  Positive results do  not rule  out bacterial infection or co-infection with other viruses. If result is PRESUMPTIVE POSTIVE SARS-CoV-2 nucleic acids MAY BE PRESENT.   A presumptive positive result was obtained on the submitted specimen  and confirmed on repeat testing.  While 2019 novel coronavirus  (SARS-CoV-2) nucleic acids may be present in the submitted sample  additional confirmatory testing may be necessary for epidemiological  and / or clinical management purposes  to differentiate between  SARS-CoV-2 and other Sarbecovirus currently known to infect humans.  If clinically indicated additional testing with an alternate test  methodology [phone]) is advised. The SARS-CoV-2 RNA is generally  detectable in upper and lower respiratory sp ecimens during the acute  phase of infection. The expected result is Negative. Fact Sheet for Patients:  BoilerBrush.com.cy Fact Sheet for Healthcare Providers: https://pope.com/ This test is not yet approved or cleared by the [place] FDA and has been authorized for detection and/or diagnosis of SARS-CoV-2 by FDA under an Emergency Use Authorization (EUA).  This EUA will remain in effect (meaning this test can be  used) for the duration of the COVID-19 declaration under Section 564(b)(1) of the Act, 21 U.S.C. section 360bbb-3(b)(1), unless the authorization is terminated or revoked sooner. Performed at [facility], [address], [place], [place] [address]   Gram stain     Status: None   Collection Time: 11/16/18  9:24 AM  Result Value Ref Range Status   Specimen Description FLUID LIVER CYST  Final   Special Requests NONE  Final   Gram Stain   Final    FEW WBC PRESENT, PREDOMINANTLY MONONUCLEAR NO ORGANISMS SEEN Performed at [facility], [address] [place], [place] [address]    Report Status 11/16/2018 FINAL  Final  Gram stain     Status: None   Collection Time: 11/16/18 11:13 AM  Result Value Ref Range Status   Specimen Description FLUID LIVER CYST  Final   Special Requests 2  Final   Gram Stain   Final    RARE WBC PRESENT, PREDOMINANTLY MONONUCLEAR NO ORGANISMS SEEN Performed at [facility], [address], [place], [place] [address]    Report Status 11/16/2018 FINAL  Final     Labs: Basic Metabolic Panel: Recent Labs  Lab 11/15/18 1312 11/15/18 1321 11/16/18 0151 11/17/18 0312  NA 141  --  140 141  K 3.3*  --  3.4* 3.4*  CL 102  --  104 104  CO2 25  --  27 27  GLUCOSE 93  --  115* 125*  BUN 10  --  9 12  CREATININE 0.76 0.60 0.94 0.85  CALCIUM 9.5  --  8.2* 8.4*  MG  --   --   --  1.7   Liver Function Tests: Recent Labs  Lab 11/15/18 1312 11/16/18 0151 11/17/18 0312  AST ALT ALKPHOS 100 91 89  BILITOT 1.1 1.1 1.0  PROT 8.2* 6.1* 6.5  ALBUMIN 3.9 3.1* 3.1*   Recent Labs  Lab 11/15/18 1312  LIPASE 25   No results for input(s): AMMONIA in the last 168 hours. CBC: Recent Labs  Lab 11/15/18 1312 11/16/18 0151 11/17/18 0312  WBC 7.4 7.0 6.6  NEUTROABS 4.3 4.3 4.5  HGB 12.2 10.6* 11.2*  HCT 38.7 32.5* 34.5*  MCV 79.5* 78.1* 78.6*  PLT 278 248 224   Cardiac Enzymes: Recent Labs  Lab 11/15/18 1312   TROPONINI <0.03   BNP: BNP (last 3  results) No results for input(s): BNP in the last 8760 hours.  ProBNP (last 3 results) No results for input(s): PROBNP in the last 8760 hours.  CBG: Recent Labs  Lab 11/16/18 0749 11/16/18 1217 11/16/18 1610 11/16/18 1810 11/16/18 2138  GLUCAP 90 86 116* 97 117*       Signed:  Albertine Grates MD, PhD  [facility] 11/17/2018, 8:38 AM

## 2018-11-19 LAB — PATHOLOGIST SMEAR REVIEW

## 2018-11-21 LAB — CULTURE, BODY FLUID W GRAM STAIN -BOTTLE
Culture: NO GROWTH
Culture: NO GROWTH
Special Requests: 2

## 2018-11-29 ENCOUNTER — Other Ambulatory Visit: Payer: Self-pay | Admitting: Gastroenterology

## 2018-11-29 DIAGNOSIS — K7689 Other specified diseases of liver: Secondary | ICD-10-CM

## 2018-11-29 DIAGNOSIS — R932 Abnormal findings on diagnostic imaging of liver and biliary tract: Secondary | ICD-10-CM | POA: Diagnosis not present

## 2018-11-29 DIAGNOSIS — R1011 Right upper quadrant pain: Secondary | ICD-10-CM | POA: Diagnosis not present

## 2018-11-29 DIAGNOSIS — K219 Gastro-esophageal reflux disease without esophagitis: Secondary | ICD-10-CM | POA: Diagnosis not present

## 2018-12-03 ENCOUNTER — Other Ambulatory Visit: Payer: Self-pay

## 2018-12-03 ENCOUNTER — Ambulatory Visit
Admission: RE | Admit: 2018-12-03 | Discharge: 2018-12-03 | Disposition: A | Discharge status: Home / Self Care | Payer: Medicare Other | Source: Ambulatory Visit | Attending: Gastroenterology | Admitting: Gastroenterology

## 2018-12-03 DIAGNOSIS — R1011 Right upper quadrant pain: Secondary | ICD-10-CM | POA: Diagnosis not present

## 2018-12-03 DIAGNOSIS — K7689 Other specified diseases of liver: Secondary | ICD-10-CM | POA: Diagnosis not present

## 2019-01-02 DIAGNOSIS — R932 Abnormal findings on diagnostic imaging of liver and biliary tract: Secondary | ICD-10-CM | POA: Diagnosis not present

## 2019-01-02 DIAGNOSIS — R1011 Right upper quadrant pain: Secondary | ICD-10-CM | POA: Diagnosis not present

## 2019-03-04 DIAGNOSIS — N898 Other specified noninflammatory disorders of vagina: Secondary | ICD-10-CM | POA: Diagnosis not present

## 2019-03-04 DIAGNOSIS — Z23 Encounter for immunization: Secondary | ICD-10-CM | POA: Diagnosis not present

## 2019-06-11 ENCOUNTER — Other Ambulatory Visit: Payer: Self-pay | Admitting: Gastroenterology

## 2019-06-11 DIAGNOSIS — R109 Unspecified abdominal pain: Secondary | ICD-10-CM

## 2019-06-11 DIAGNOSIS — R197 Diarrhea, unspecified: Secondary | ICD-10-CM | POA: Diagnosis not present

## 2019-06-11 DIAGNOSIS — R1011 Right upper quadrant pain: Secondary | ICD-10-CM | POA: Diagnosis not present

## 2019-06-11 DIAGNOSIS — R932 Abnormal findings on diagnostic imaging of liver and biliary tract: Secondary | ICD-10-CM

## 2019-06-20 ENCOUNTER — Ambulatory Visit
Admission: RE | Admit: 2019-06-20 | Discharge: 2019-06-20 | Disposition: A | Discharge status: Home / Self Care | Payer: Medicare Other | Source: Ambulatory Visit | Attending: Gastroenterology | Admitting: Gastroenterology

## 2019-06-20 DIAGNOSIS — R932 Abnormal findings on diagnostic imaging of liver and biliary tract: Secondary | ICD-10-CM

## 2019-06-20 DIAGNOSIS — R109 Unspecified abdominal pain: Secondary | ICD-10-CM

## 2019-07-25 ENCOUNTER — Ambulatory Visit: Payer: PRIVATE HEALTH INSURANCE | Attending: Internal Medicine

## 2019-07-25 DIAGNOSIS — Z23 Encounter for immunization: Secondary | ICD-10-CM | POA: Insufficient documentation

## 2019-07-25 NOTE — Progress Notes (Signed)
   Covid-19 Vaccination Clinic  Name:  Jasmine Contreras    MRN: [id number] DOB: 1951-05-01  07/25/2019  [name] was observed post Covid-19 immunization for 30 minutes based on pre-vaccination screening without incidence. She was provided with Vaccine Information Sheet and instruction to access the V-Safe system.   [name] was instructed to call 911 with any severe reactions post vaccine: Marland Kitchen Difficulty breathing  . Swelling of your face and throat  . A fast heartbeat  . A bad rash all over your body  . Dizziness and weakness    Immunizations Administered    Name Date Dose VIS Date Route   Pfizer COVID-19 Vaccine 07/25/2019  3:06 PM 0.3 mL 06/14/2019 Intramuscular   Manufacturer: [employer], [employer]   Lot: [id number]   NDC: [address]-4

## 2019-08-15 ENCOUNTER — Ambulatory Visit: Payer: PRIVATE HEALTH INSURANCE | Attending: Internal Medicine

## 2019-08-15 DIAGNOSIS — Z23 Encounter for immunization: Secondary | ICD-10-CM | POA: Insufficient documentation

## 2019-08-15 NOTE — Progress Notes (Signed)
   Covid-19 Vaccination Clinic  Name:  Jasmine Contreras    MRN: [id number] DOB: 25-Apr-1951  08/15/2019  Jasmine Contreras was observed post Covid-19 immunization for 30 minutes based on pre-vaccination screening without incidence. She was provided with Vaccine Information Sheet and instruction to access the V-Safe system.   Jasmine Contreras was instructed to call 911 with any severe reactions post vaccine: Marland Kitchen Difficulty breathing  . Swelling of your face and throat  . A fast heartbeat  . A bad rash all over your body  . Dizziness and weakness    Immunizations Administered    Name Date Dose VIS Date Route   Pfizer COVID-19 Vaccine 08/15/2019  4:32 PM 0.3 mL 06/14/2019 Intramuscular   Manufacturer: [employer], [employer]   Lot: [id number]   NDC: [address]-1

## 2019-08-27 ENCOUNTER — Other Ambulatory Visit: Payer: Self-pay | Admitting: Family Medicine

## 2019-08-27 DIAGNOSIS — Z1231 Encounter for screening mammogram for malignant neoplasm of breast: Secondary | ICD-10-CM

## 2019-08-28 ENCOUNTER — Other Ambulatory Visit: Payer: Self-pay | Admitting: Family Medicine

## 2019-08-28 DIAGNOSIS — R2230 Localized swelling, mass and lump, unspecified upper limb: Secondary | ICD-10-CM

## 2019-08-28 DIAGNOSIS — R222 Localized swelling, mass and lump, trunk: Secondary | ICD-10-CM

## 2019-09-03 ENCOUNTER — Other Ambulatory Visit: Payer: Self-pay | Admitting: Surgery

## 2019-09-03 DIAGNOSIS — Z9889 Other specified postprocedural states: Secondary | ICD-10-CM

## 2019-09-11 ENCOUNTER — Ambulatory Visit
Admission: RE | Admit: 2019-09-11 | Discharge: 2019-09-11 | Disposition: A | Discharge status: Home / Self Care | Payer: PRIVATE HEALTH INSURANCE | Source: Ambulatory Visit | Attending: Family Medicine | Admitting: Family Medicine

## 2019-09-11 ENCOUNTER — Other Ambulatory Visit: Payer: Self-pay

## 2019-09-11 ENCOUNTER — Other Ambulatory Visit: Payer: Self-pay | Admitting: Family Medicine

## 2019-09-11 ENCOUNTER — Ambulatory Visit
Admission: RE | Admit: 2019-09-11 | Discharge: 2019-09-11 | Disposition: A | Discharge status: Home / Self Care | Payer: Medicare Other | Source: Ambulatory Visit | Attending: Family Medicine | Admitting: Family Medicine

## 2019-09-11 DIAGNOSIS — R2230 Localized swelling, mass and lump, unspecified upper limb: Secondary | ICD-10-CM

## 2019-09-11 DIAGNOSIS — R222 Localized swelling, mass and lump, trunk: Secondary | ICD-10-CM

## 2019-09-11 DIAGNOSIS — R599 Enlarged lymph nodes, unspecified: Secondary | ICD-10-CM

## 2019-09-12 ENCOUNTER — Ambulatory Visit
Admission: RE | Admit: 2019-09-12 | Discharge: 2019-09-12 | Disposition: A | Discharge status: Home / Self Care | Payer: PRIVATE HEALTH INSURANCE | Source: Ambulatory Visit | Attending: Surgery | Admitting: Surgery

## 2019-09-12 ENCOUNTER — Other Ambulatory Visit: Payer: PRIVATE HEALTH INSURANCE

## 2019-09-12 DIAGNOSIS — Z9889 Other specified postprocedural states: Secondary | ICD-10-CM

## 2019-09-25 ENCOUNTER — Ambulatory Visit: Payer: Self-pay | Admitting: Surgery

## 2019-10-28 ENCOUNTER — Encounter (INDEPENDENT_AMBULATORY_CARE_PROVIDER_SITE_OTHER): Payer: Self-pay | Admitting: Otolaryngology

## 2019-10-28 ENCOUNTER — Other Ambulatory Visit: Payer: Self-pay

## 2019-10-28 ENCOUNTER — Ambulatory Visit (INDEPENDENT_AMBULATORY_CARE_PROVIDER_SITE_OTHER): Payer: Medicare Other | Admitting: Otolaryngology

## 2019-10-28 VITALS — Temp 97.7°F

## 2019-10-28 DIAGNOSIS — H6123 Impacted cerumen, bilateral: Secondary | ICD-10-CM | POA: Diagnosis not present

## 2019-10-28 NOTE — Progress Notes (Signed)
HPI: Jasmine Contreras is a 69 y.o. female who presents for evaluation of blocked hearing especially on the right side.  She states that the ears feel "clogged"..  Past Medical History:  Diagnosis Date  . Acid reflux   . Anxiety   . Arthritis    trochanteric bursitis  . Back pain   . Chest pain   . Cough   . Depression   . Diarrhea   . Difficulty swallowing   . Dizziness   . Double vision   . Dry mouth   . Fatigue   . GERD (gastroesophageal reflux disease)   . Gout   . Headache   . Heart murmur   . Herpes   . Hiatal hernia   . Irritable bowel syndrome   . Itching   . Joint pain   . MVP (mitral valve prolapse)   . Neck stiffness   . Palpitations   . PUD (peptic ulcer disease)    s/p vagotomy  . Retaining fluid   . Ringing in ears   . Shortness of breath   . Stress   . Swelling of extremity   . Swelling of finger   . TIA (transient ischemic attack)   . Varicose veins   . Vertigo   . Vision changes   . Vitamin D deficiency   . Wheezing    Past Surgical History:  Procedure Laterality Date  . BREAST EXCISIONAL BIOPSY Left   . CHOLECYSTECTOMY    . FOOT SURGERY    . partial gastrectomy with vagotomy    . right arthroscopic knee surgery    . trochanteric bursectomy    . VAGINAL HYSTERECTOMY     with oophorectomy   Social History   Socioeconomic History  . Marital status: Married    Spouse name: Fayrene Fearing  . Number of children: 3  . Years of education: College  . Highest education level: Not on file  Occupational History  . Occupation: Retired   Tobacco Use  . Smoking status: Never Smoker  . Smokeless tobacco: Never Used  Substance and Sexual Activity  . Alcohol use: Yes    Alcohol/week: 0.0 standard drinks    Comment: 1 glass of wine with dinner  . Drug use: No  . Sexual activity: Not Currently    Birth control/protection: Surgical  Other Topics Concern  . Not on file  Social History Narrative   Patient drinks a pepsi or glass of tea in the  morning.    Social Determinants of Health   Financial Resource Strain:   . Difficulty of Paying Living Expenses:   Food Insecurity:   . Worried About Programme researcher, broadcasting/film/video in the Last Year:   . Barista in the Last Year:   Transportation Needs:   . Freight forwarder (Medical):   Marland Kitchen Lack of Transportation (Non-Medical):   Physical Activity:   . Days of Exercise per Week:   . Minutes of Exercise per Session:   Stress:   . Feeling of Stress :   Social Connections:   . Frequency of Communication with Friends and Family:   . Frequency of Social Gatherings with Friends and Family:   . Attends Religious Services:   . Active Member of Clubs or Organizations:   . Attends Banker Meetings:   Marland Kitchen Marital Status:    Family History  Problem Relation Age of Onset  . Diabetes Mother   . Hypertension Mother   . Kidney disease Mother   .  Alcohol abuse Father   . Hyperlipidemia Father   . Hypertension Father   . Stroke Father   . Depression Father   . Sleep apnea Father    Allergies  Allergen Reactions  . Honey Bee Treatment [Bee Venom] Anaphylaxis and Swelling    Body swells  . Other Other (See Comments)    Has diverticulitis- cannot have Schuyler!!  Marland Kitchen Shellfish-Derived Products Anaphylaxis  . Strawberry Extract Anaphylaxis  . Peanuts [Peanut Oil] Hives  . Abilify [Aripiprazole] Other (See Comments)    Somnolence    . Adhesive [Tape] Hives and Itching  . Codeine Itching and Nausea And Vomiting  . Hydrocodone Itching and Nausea And Vomiting  . Latex Hives and Itching  . Meclizine Hcl Nausea Only  . Oxycodone Hcl Itching and Nausea And Vomiting  . Prednisone Other (See Comments) and Hypertension    "Makes my face turn red, also"  . Scopace [Scopolamine] Nausea Only  . Sertraline Other (See Comments)    Suicidal thoughts   . Tavist-D [Albertsons Dayhist-D] Other (See Comments)    Makes my nose drain  . Tramadol Nausea Only   Prior to  Admission medications   Medication Sig Start Date End Date Taking? Authorizing Provider  acetaminophen (TYLENOL) 325 MG tablet Take 325-650 mg by mouth every 6 (six) hours as needed (for pain or headaches).   Yes [provider]  allopurinol (ZYLOPRIM) 100 MG tablet Take 100 mg by mouth daily.   Yes [provider]  BIOTIN FORTE PO Take 1 capsule by mouth every other day.    Yes [provider]  Cholecalciferol (VITAMIN D3) 250 MCG (10000 UT) TABS Take 20,000 Units by mouth daily.    Yes [provider]  colchicine 0.6 MG tablet Take 0.6 mg by mouth daily as needed (as directed for gout flares).    Yes [provider]  EPINEPHrine 0.3 mg/0.3 mL IJ SOAJ injection Inject 0.3 mg into the muscle once as needed for anaphylaxis.    Yes [provider]  indomethacin (INDOCIN) 50 MG capsule Take 50 mg by mouth 2 (two) times daily as needed (as directed for pain/gout flares).    Yes [provider]  loratadine (CLARITIN) 10 MG tablet Take 10 mg by mouth daily.   Yes [provider]  metFORMIN (GLUCOPHAGE) 500 MG tablet Take 1 tablet (500 mg total) by mouth daily with breakfast. 08/27/18  Yes Leafy Ro, Caren D, MD  metoprolol succinate (TOPROL-XL) 25 MG 24 hr tablet Take 25 mg by mouth at bedtime.  09/24/15  Yes [provider]  nitroGLYCERIN (NITROSTAT) 0.4 MG SL tablet Place 0.4 mg under the tongue every 5 (five) minutes as needed for chest pain.   Yes [provider]  NON FORMULARY Place 2 drops under the tongue See admin instructions. CBD oil: Place 2 drops under the tongue at bedtime as needed for sleep   Yes [provider]  pantoprazole (PROTONIX) 40 MG tablet Take 30- 60 min before your first and last meals of the day Patient taking differently: Take 40 mg by mouth See admin instructions. Take 40 mg by mouth 30- 60 minutes before breakfast and supper (evening meal) 01/16/18  Yes Tanda Rockers, MD  potassium  chloride SA (K-DUR,KLOR-CON) 20 MEQ tablet Take 40 mEq by mouth daily.    Yes [provider]  scopolamine (TRANSDERM-SCOP) 1 MG/3DAYS Place 1 patch onto the skin every 3 (three) days as needed (for motion sickness).  Yes [provider]  sucralfate (CARAFATE) 1 g tablet Take 1 g by mouth 2 (two) times daily.   Yes [provider]  torsemide (DEMADEX) 20 MG tablet Take 30 mg by mouth daily.   Yes [provider]  vitamin B-12 (CYANOCOBALAMIN) 1000 MCG tablet Take 1,000 mcg by mouth 2 (two) times a week.    Yes [provider]  zinc gluconate 50 MG tablet Take 50 mg by mouth daily.   Yes [provider]     Positive ROS: Otherwise negative   All other systems have been reviewed and were otherwise negative with the exception of those mentioned in the HPI and as above.  Physical Exam: Constitutional: Alert, well-appearing, no acute distress Ears: External ears without lesions or tenderness. Ear canals are small bilaterally with wax obstructing both ear canals right side worse than left.  This was cleaned in the office using forceps and curettes.  TMs were clear bilaterally.. Nasal: External nose without lesions. Clear nasal passages Oral: Oropharynx clear. Neck: No palpable adenopathy or masses Respiratory: Breathing comfortably  Skin: No facial/neck lesions or rash noted.  Cerumen impaction removal  Date/Time: 10/28/2019 6:01 PM Performed by: Drema Halon, MD Authorized by: Drema Halon, MD   Consent:    Consent obtained:  Verbal   Consent given by:  Patient   Risks discussed:  Pain and bleeding Procedure details:    Location:  L ear and R ear   Procedure type: curette and forceps   Post-procedure details:    Inspection:  TM intact and canal normal   Hearing quality:  Improved   Patient tolerance of procedure:  Tolerated well, no immediate complications Comments:     TMs are clear  bilaterally    Assessment: Bilateral cerumen impactions  Plan: She will follow-up as needed  Narda Bonds, MD

## 2019-10-29 NOTE — Patient Instructions (Addendum)
DUE TO COVID-19 ONLY ONE VISITOR IS ALLOWED TO COME WITH YOU AND STAY IN THE WAITING ROOM ONLY DURING PRE OP AND PROCEDURE DAY OF SURGERY. THE 1 VISITOR MAY VISIT WITH YOU AFTER SURGERY IN YOUR PRIVATE ROOM DURING VISITING HOURS ONLY!  YOU NEED TO HAVE A COVID 19 TEST ON: 11/05/19 @ 9:00 am  , THIS TEST MUST BE DONE BEFORE SURGERY, COME  [address], [place] , [address].  [facility]) ONCE YOUR COVID TEST IS COMPLETED, PLEASE BEGIN THE QUARANTINE INSTRUCTIONS AS OUTLINED IN YOUR HANDOUT.                Jazmynn Pho Drumgoole    Your procedure is scheduled on: 11/08/19   Report to [facility] Main  Entrance   Report to short stay at: 5:30 AM     Call this number if you have problems the morning of surgery [phone]    Remember: Do not eat food or drink liquids :After Midnight.   BRUSH YOUR TEETH MORNING OF SURGERY AND RINSE YOUR MOUTH OUT, NO CHEWING GUM CANDY OR MINTS.     Take these medicines the morning of surgery with A SIP OF WATER: allopurinol,loratadine,metoprolol,pantoprazole.Colchicine as needed. How to Manage Your Diabetes Before and After Surgery  Why is it important to control my blood sugar before and after surgery? . Improving blood sugar levels before and after surgery helps healing and can limit problems. . A way of improving blood sugar control is eating a healthy diet by: o  Eating less sugar and carbohydrates o  Increasing activity/exercise o  Talking with your doctor about reaching your blood sugar goals . High blood sugars (greater than 180 mg/dL) can raise your risk of infections and slow your recovery, so you will need to focus on controlling your diabetes during the weeks before surgery. . Make sure that the doctor who takes care of your diabetes knows about your planned surgery including the date and location.  How do I manage my blood sugar before surgery? . Check your blood sugar at least 4 times a day, starting 2 days before  surgery, to make sure that the level is not too high or low. o Check your blood sugar the morning of your surgery when you wake up and every 2 hours until you get to the Short Stay unit. . If your blood sugar is less than 70 mg/dL, you will need to treat for low blood sugar: o Do not take insulin. o Treat a low blood sugar (less than 70 mg/dL) with  cup of clear juice (cranberry or apple), 4 glucose tablets, OR glucose gel. o Recheck blood sugar in 15 minutes after treatment (to make sure it is greater than 70 mg/dL). If your blood sugar is not greater than 70 mg/dL on recheck, call [phone] for further instructions. . Report your blood sugar to the short stay nurse when you get to Short Stay.  . If you are admitted to the hospital after surgery: o Your blood sugar will be checked by the staff and you will probably be given insulin after surgery (instead of oral diabetes medicines) to make sure you have good blood sugar levels. o The goal for blood sugar control after surgery is 80-180 mg/dL.   WHAT DO I DO ABOUT MY DIABETES MEDICATION?  Marland Kitchen Do not take oral diabetes medicines (pills) the morning of surgery.  . THE DAY BEFORE SURGERY, take Metformin as usual.       .  THE MORNING OF SURGERY, DO NOT take  Metformin.   DO NOT TAKE ANY DIABETIC MEDICATIONS DAY OF YOUR SURGERY                               You may not have any metal on your body including hair pins and              piercings  Do not wear jewelry, make-up, lotions, powders or perfumes, deodorant             Do not wear nail polish on your fingernails.  Do not shave  48 hours prior to surgery.               Do not bring valuables to the hospital. [place].  Contacts, dentures or bridgework may not be worn into surgery.  Leave suitcase in the car. After surgery it may be brought to your room.     Patients discharged the day of surgery will not be allowed to drive home.  IF YOU ARE HAVING SURGERY AND GOING HOME THE SAME DAY, YOU MUST HAVE AN ADULT TO DRIVE YOU HOME AND BE WITH YOU FOR 24 HOURS. YOU MAY GO HOME BY TAXI OR UBER OR ORTHERWISE, BUT AN ADULT MUST ACCOMPANY YOU HOME AND STAY WITH YOU FOR 24 HOURS.  Name and phone number of your driver:  Special Instructions: N/A              Please read over the following fact sheets you were given: _____________________________________________________________________             [facility] - Preparing for Surgery Before surgery, you can play an important role.  Because skin is not sterile, your skin needs to be as free of germs as possible.  You can reduce the number of germs on your skin by washing with CHG (chlorahexidine gluconate) soap before surgery.  CHG is an antiseptic cleaner which kills germs and bonds with the skin to continue killing germs even after washing. Please DO NOT use if you have an allergy to CHG or antibacterial soaps.  If your skin becomes reddened/irritated stop using the CHG and inform your nurse when you arrive at Short Stay. Do not shave (including legs and underarms) for at least 48 hours prior to the first CHG shower.  You may shave your face/neck. Please follow these instructions carefully:  1.  Shower with CHG Soap the night before surgery and the  morning of Surgery.  2.  If you choose to wash your hair, wash your hair first as usual with your  normal  shampoo.  3.  After you shampoo, rinse your hair and body thoroughly to remove the  shampoo.                           4.  Use CHG as you would any other liquid soap.  You can apply chg directly  to the skin and wash                       Gently with a scrungie or clean washcloth.  5.  Apply the CHG Soap to your body ONLY FROM THE NECK DOWN.   Do not use on face/ open  Wound or open sores. Avoid contact with eyes, ears mouth and genitals (private parts).                       Wash face,  Genitals (private  parts) with your normal soap.             6.  Wash thoroughly, paying special attention to the area where your surgery  will be performed.  7.  Thoroughly rinse your body with warm water from the neck down.  8.  DO NOT shower/wash with your normal soap after using and rinsing off  the CHG Soap.                9.  Pat yourself dry with a clean towel.            10.  Wear clean pajamas.            11.  Place clean sheets on your bed the night of your first shower and do not  sleep with pets. Day of Surgery : Do not apply any lotions/deodorants the morning of surgery.  Please wear clean clothes to the hospital/surgery center.  FAILURE TO FOLLOW THESE INSTRUCTIONS MAY RESULT IN THE CANCELLATION OF YOUR SURGERY PATIENT SIGNATURE_________________________________  NURSE SIGNATURE__________________________________  ________________________________________________________________________

## 2019-10-31 ENCOUNTER — Other Ambulatory Visit: Payer: Self-pay

## 2019-10-31 ENCOUNTER — Encounter (HOSPITAL_COMMUNITY): Payer: Self-pay

## 2019-10-31 ENCOUNTER — Encounter (HOSPITAL_COMMUNITY)
Admission: RE | Admit: 2019-10-31 | Discharge: 2019-10-31 | Disposition: A | Discharge status: Home / Self Care | Payer: Medicare Other | Source: Ambulatory Visit | Attending: Surgery | Admitting: Surgery

## 2019-10-31 DIAGNOSIS — Z01812 Encounter for preprocedural laboratory examination: Secondary | ICD-10-CM | POA: Insufficient documentation

## 2019-10-31 HISTORY — DX: Unspecified asthma, uncomplicated: J45.909

## 2019-10-31 HISTORY — DX: Cardiac arrhythmia, unspecified: I49.9

## 2019-10-31 HISTORY — DX: Other complications of anesthesia, initial encounter: T88.59XA

## 2019-10-31 HISTORY — DX: Anemia, unspecified: D64.9

## 2019-10-31 HISTORY — DX: Inflammatory liver disease, unspecified: K75.9

## 2019-10-31 HISTORY — DX: Essential (primary) hypertension: I10

## 2019-10-31 HISTORY — DX: Pneumonia, unspecified organism: J18.9

## 2019-10-31 LAB — BASIC METABOLIC PANEL
Anion gap: 10 (ref 5–15)
BUN: 9 mg/dL (ref 8–23)
CO2: 29 mmol/L (ref 22–32)
Calcium: 9.2 mg/dL (ref 8.9–10.3)
Chloride: 103 mmol/L (ref 98–111)
Creatinine, Ser: 0.64 mg/dL (ref 0.44–1.00)
GFR calc Af Amer: >60 mL/min (ref >60)
GFR calc non Af Amer: >60 mL/min (ref >60)
Glucose, Bld: 92 mg/dL (ref 70–99)
Potassium: 3.4 mmol/L — ABNORMAL LOW (ref 3.5–5.1)
Sodium: 142 mmol/L (ref 135–145)

## 2019-10-31 LAB — CBC
HCT: 38.1 % (ref 36.0–46.0)
Hemoglobin: 12.1 g/dL (ref 12.0–15.0)
MCH: 26 pg (ref 26.0–34.0)
MCHC: 31.8 g/dL (ref 30.0–36.0)
MCV: 81.8 fL (ref 80.0–100.0)
Platelets: 259 10*3/uL (ref 150–400)
RBC: 4.66 MIL/uL (ref 3.87–5.11)
RDW: 16.4 % — ABNORMAL HIGH (ref 11.5–15.5)
WBC: 7.5 10*3/uL (ref 4.0–10.5)
nRBC: 0 % (ref 0.0–0.2)

## 2019-10-31 LAB — GLUCOSE, CAPILLARY: Glucose-Capillary: 80 mg/dL (ref 70–99)

## 2019-10-31 NOTE — Progress Notes (Signed)
PCP - Dr. Laurann Montana Cardiologist -   Chest x-ray - 11/15/18 EPIC EKG - 11/15/18 EPIC Stress Test -  ECHO -  Cardiac Cath -   Sleep Study - yes CPAP - no  Fasting Blood Sugar -  Checks Blood Sugar _____ times a day  Blood Thinner Instructions: Aspirin Instructions: Last Dose:  Anesthesia review:   Patient denies shortness of breath, fever, cough and chest pain at PAT appointment   Patient verbalized understanding of instructions that were given to them at the PAT appointment. Patient was also instructed that they will need to review over the PAT instructions again at home before surgery.

## 2019-11-05 ENCOUNTER — Other Ambulatory Visit (HOSPITAL_COMMUNITY)
Admission: RE | Admit: 2019-11-05 | Discharge: 2019-11-05 | Disposition: A | Discharge status: Home / Self Care | Payer: Medicare Other | Source: Ambulatory Visit | Attending: Surgery | Admitting: Surgery

## 2019-11-05 DIAGNOSIS — Z01812 Encounter for preprocedural laboratory examination: Secondary | ICD-10-CM | POA: Diagnosis present

## 2019-11-05 DIAGNOSIS — Z20822 Contact with and (suspected) exposure to covid-19: Secondary | ICD-10-CM | POA: Diagnosis not present

## 2019-11-05 LAB — SARS CORONAVIRUS 2 (TAT 6-24 HRS): SARS Coronavirus 2: NEGATIVE

## 2019-11-06 ENCOUNTER — Inpatient Hospital Stay: Admission: RE | Admit: 2019-11-06 | Payer: Medicare Other | Source: Ambulatory Visit

## 2019-11-06 NOTE — H&P (Signed)
Jasmine Contreras Patient #: [id number] DOB: Nov 11, 1950 Married / Language: English / Race: Black or African American Female   History of Present Illness Molli Hazard B. Daphine Deutscher MD; 09/25/2019 2:16 PM) Patient words:69 year old female with recurrent painful liver cysts.   UGI reviewed and negative. Her RUQ pain is her liver cyst and on her CT this is a right lateral cyst that is seen. There are numerous cysts as well as a large one in her left lateral segment. I have discussed laparoscopy and unroofing of as many of these as we can.  This may be limited by adhesions from her prior surgeries   Allergies Doristine Devoid, CMA; 09/25/2019 1:39 PM) BEE VENOM  Anaphylaxis. SHELLFISH  Anaphylaxis. Strawberry (Diagnostic) *DIAGNOSTIC PRODUCTS*  Anaphylaxis. Peanuts  Hives. Abilify *ANTIPSYCHOTICS/ANTIMANIC AGENTS*  Adhesive Tape  Codeine Sulfate *ANALGESICS - OPIOID*  Latex  Meclizine *ANTIEMETICS*  oxyCODONE HCl *ANALGESICS - OPIOID*  predniSONE *CORTICOSTEROIDS*  Scopace *ANTIEMETICS*  Sertraline HCl *ANTIDEPRESSANTS*  traMADol HCl *ANALGESICS - OPIOID*   Medication History (Chemira Jones, CMA; 09/25/2019 1:39 PM) Tylenol (325MG  Tablet, Oral) Active. Biotin (Oral) Specific strength unknown - Active. D 10000 (250 MCG(10000 UT) Capsule, Oral) Active. EPINEPHrine (0.3MG /0.3ML Soln Auto-inj, Injection) Active. Iron (240 (27 Fe)MG Tablet, Oral) Active. Nitroglycerin (0.4MG  Tab Sublingual, Sublingual) Active. Floxin Otic (0.3% Solution, Otic) Active. Pantoprazole Sodium (40MG  Tablet DR, Oral) Active. Potassium (Oral) Specific strength unknown - Active. Colchicine (0.6MG  Tablet, Oral) Active. Indomethacin (50MG  Capsule, Oral) Active. Torsemide (20MG  Tablet, Oral) Active. Vitamin B12 ( Tablet ER, Oral) Active. Klor-Con M20 ( Tablet ER, Oral) Active. Metoprolol Succinate ER (25MG  Tablet ER 24HR, Oral) Active. Vitamin D3 (25 MCG(1000 UT) Capsule, Oral)  Active. Medications Reconciled  Vitals (Chemira Jones CMA; 09/25/2019 1:39 PM) 09/25/2019 1:38 PM Weight: 263.4 lb Height: 69in Body Surface Area: 2.32 m Body Mass Index: 38.9 kg/m  BP: 118/70(Sitting, Left Arm, Standard)  HEENT unremarkable Neck supple Chest clear Heart SR Abdomen Note: Ruq discomfort. This is more pronounced than before. Otherwise no change.  Upper midline incision.     Assessment & Plan B. MD; 09/25/2019 2:20 PM) HEPATIC CYST (K76.89) Impression: painful hepatic cysts especially in the right lobe laterally. plan laparoscopic or open unroofing of hepatic cysts. All depends on the density of adhesions  Matt B. 09/27/2019, MD, FACS

## 2019-11-07 MED ORDER — BUPIVACAINE LIPOSOME 1.3 % IJ SUSP
20.0000 mL | INTRAMUSCULAR | Status: DC
  Filled 2019-11-07: qty 20

## 2019-11-07 NOTE — Anesthesia Preprocedure Evaluation (Addendum)
Anesthesia Evaluation    Reviewed: Allergy & Precautions, H&P , Patient's Chart, lab work & pertinent test results  History of Anesthesia Complications (+) PONV and history of anesthetic complications  Airway Mallampati: I  TM Distance: >3 FB Neck ROM: Full    Dental no notable dental hx. (+) Missing, Poor Dentition,    Pulmonary    Pulmonary exam normal breath sounds clear to auscultation       Cardiovascular Exercise Tolerance: Good hypertension, + DOE  Normal cardiovascular exam+ dysrhythmias  Rhythm:Regular Rate:Normal     Neuro/Psych  Headaches, PSYCHIATRIC DISORDERS Anxiety Depression TIA   GI/Hepatic hiatal hernia, PUD, GERD  Medicated and Controlled,(+) Hepatitis -  Endo/Other  Morbid obesity  Renal/GU negative Renal ROS  negative genitourinary   Musculoskeletal  (+) Arthritis , Osteoarthritis,    Abdominal   Peds  Hematology  (+) Blood dyscrasia, anemia ,   Anesthesia Other Findings   Reproductive/Obstetrics negative OB ROS                            Anesthesia Physical Anesthesia Plan  ASA: III  Anesthesia Plan: General   Post-op Pain Management:    Induction: Intravenous  PONV Risk Score and Plan: 3 and Ondansetron and Dexamethasone  Airway Management Planned: Oral ETT  Additional Equipment:   Intra-op Plan:   Post-operative Plan: Extubation in OR  Informed Consent: I have reviewed the patients History and Physical, chart, labs and discussed the procedure including the risks, benefits and alternatives for the proposed anesthesia with the patient or authorized representative who has indicated his/her understanding and acceptance.       Plan Discussed with: Anesthesiologist, CRNA and Surgeon  Anesthesia Plan Comments: (  )       Anesthesia Quick Evaluation

## 2019-11-08 ENCOUNTER — Inpatient Hospital Stay (HOSPITAL_COMMUNITY): Payer: Medicare Other | Admitting: Anesthesiology

## 2019-11-08 ENCOUNTER — Encounter (HOSPITAL_COMMUNITY)
Admission: RE | Disposition: A | Discharge status: Home / Self Care | Payer: Self-pay | Source: Home / Self Care | Attending: Surgery

## 2019-11-08 ENCOUNTER — Encounter (HOSPITAL_COMMUNITY): Payer: Self-pay | Admitting: Surgery

## 2019-11-08 ENCOUNTER — Inpatient Hospital Stay (HOSPITAL_COMMUNITY): Payer: Medicare Other | Admitting: Physician Assistant

## 2019-11-08 ENCOUNTER — Inpatient Hospital Stay (HOSPITAL_COMMUNITY)
Admission: RE | Admit: 2019-11-08 | Discharge: 2019-11-10 | DRG: 443 | Disposition: A | Discharge status: Home / Self Care | Payer: Medicare Other | Attending: Surgery | Admitting: Surgery

## 2019-11-08 ENCOUNTER — Other Ambulatory Visit: Payer: Self-pay

## 2019-11-08 DIAGNOSIS — Q446 Cystic disease of liver: Principal | ICD-10-CM

## 2019-11-08 DIAGNOSIS — I10 Essential (primary) hypertension: Secondary | ICD-10-CM | POA: Diagnosis present

## 2019-11-08 DIAGNOSIS — Z6838 Body mass index (BMI) 38.0-38.9, adult: Secondary | ICD-10-CM

## 2019-11-08 DIAGNOSIS — Z885 Allergy status to narcotic agent status: Secondary | ICD-10-CM

## 2019-11-08 DIAGNOSIS — Z91040 Latex allergy status: Secondary | ICD-10-CM

## 2019-11-08 DIAGNOSIS — Z888 Allergy status to other drugs, medicaments and biological substances status: Secondary | ICD-10-CM

## 2019-11-08 HISTORY — PX: CHOLECYSTECTOMY: SHX55

## 2019-11-08 LAB — CBC
HCT: 36.9 % (ref 36.0–46.0)
Hemoglobin: 11.3 g/dL — ABNORMAL LOW (ref 12.0–15.0)
MCH: 25.1 pg — ABNORMAL LOW (ref 26.0–34.0)
MCHC: 30.6 g/dL (ref 30.0–36.0)
MCV: 81.8 fL (ref 80.0–100.0)
Platelets: 259 10*3/uL (ref 150–400)
RBC: 4.51 MIL/uL (ref 3.87–5.11)
RDW: 16.4 % — ABNORMAL HIGH (ref 11.5–15.5)
WBC: 10.2 10*3/uL (ref 4.0–10.5)
nRBC: 0 % (ref 0.0–0.2)

## 2019-11-08 LAB — GLUCOSE, CAPILLARY: Glucose-Capillary: 143 mg/dL — ABNORMAL HIGH (ref 70–99)

## 2019-11-08 LAB — CREATININE, SERUM
Creatinine, Ser: 0.84 mg/dL (ref 0.44–1.00)
GFR calc Af Amer: >60 mL/min (ref >60)
GFR calc non Af Amer: >60 mL/min (ref >60)

## 2019-11-08 LAB — HEMOGLOBIN A1C
Hgb A1c MFr Bld: 6.3 % — ABNORMAL HIGH (ref 4.8–5.6)
Mean Plasma Glucose: 134.11 mg/dL

## 2019-11-08 SURGERY — LAPAROSCOPIC CHOLECYSTECTOMY
Anesthesia: General | Site: Abdomen

## 2019-11-08 MED ORDER — HYDROMORPHONE HCL 1 MG/ML IJ SOLN
INTRAMUSCULAR | Status: AC
  Filled 2019-11-08: qty 1

## 2019-11-08 MED ORDER — ROCURONIUM BROMIDE 10 MG/ML (PF) SYRINGE
PREFILLED_SYRINGE | INTRAVENOUS | Status: DC | PRN
  Administered 2019-11-08: 20 mg via INTRAVENOUS
  Administered 2019-11-08: 60 mg via INTRAVENOUS

## 2019-11-08 MED ORDER — FENTANYL CITRATE (PF) 100 MCG/2ML IJ SOLN
12.5000 ug | INTRAMUSCULAR | Status: DC | PRN
  Administered 2019-11-08 – 2019-11-09 (×3): 12.5 ug via INTRAVENOUS
  Filled 2019-11-08 (×3): qty 2

## 2019-11-08 MED ORDER — MEPERIDINE HCL 50 MG/ML IJ SOLN: 6.2500 mg | INTRAMUSCULAR | Status: DC | PRN

## 2019-11-08 MED ORDER — PHENYLEPHRINE HCL-NACL 10-0.9 MG/250ML-% IV SOLN
INTRAVENOUS | Status: AC
  Filled 2019-11-08: qty 500

## 2019-11-08 MED ORDER — LIDOCAINE 2% (20 MG/ML) 5 ML SYRINGE
INTRAMUSCULAR | Status: DC | PRN
  Administered 2019-11-08: 100 mg via INTRAVENOUS

## 2019-11-08 MED ORDER — OXYCODONE HCL 5 MG/5ML PO SOLN: 5.0000 mg | Freq: Once | ORAL | Status: DC | PRN

## 2019-11-08 MED ORDER — PROPOFOL 10 MG/ML IV BOLUS
INTRAVENOUS | Status: DC | PRN
  Administered 2019-11-08: 200 mg via INTRAVENOUS

## 2019-11-08 MED ORDER — ONDANSETRON 4 MG PO TBDP: 4.0000 mg | ORAL_TABLET | Freq: Four times a day (QID) | ORAL | Status: DC | PRN

## 2019-11-08 MED ORDER — SODIUM CHLORIDE 0.9 % IV SOLN
2.0000 g | INTRAVENOUS | Status: AC
  Administered 2019-11-08: 2 g via INTRAVENOUS
  Filled 2019-11-08: qty 2

## 2019-11-08 MED ORDER — ONDANSETRON HCL 4 MG/2ML IJ SOLN
INTRAMUSCULAR | Status: DC | PRN
  Administered 2019-11-08 (×2): 4 mg via INTRAVENOUS

## 2019-11-08 MED ORDER — FENTANYL CITRATE (PF) 100 MCG/2ML IJ SOLN
INTRAMUSCULAR | Status: AC
  Filled 2019-11-08: qty 2

## 2019-11-08 MED ORDER — ROCURONIUM BROMIDE 10 MG/ML (PF) SYRINGE
PREFILLED_SYRINGE | INTRAVENOUS | Status: AC
  Filled 2019-11-08: qty 30

## 2019-11-08 MED ORDER — ONDANSETRON HCL 4 MG/2ML IJ SOLN: 4.0000 mg | Freq: Once | INTRAMUSCULAR | Status: DC | PRN

## 2019-11-08 MED ORDER — HEPARIN SODIUM (PORCINE) 5000 UNIT/ML IJ SOLN
5000.0000 [IU] | Freq: Once | INTRAMUSCULAR | Status: AC
  Administered 2019-11-08: 5000 [IU] via SUBCUTANEOUS
  Filled 2019-11-08: qty 1

## 2019-11-08 MED ORDER — PROPOFOL 10 MG/ML IV BOLUS
INTRAVENOUS | Status: AC
  Filled 2019-11-08: qty 40

## 2019-11-08 MED ORDER — FENTANYL CITRATE (PF) 100 MCG/2ML IJ SOLN
25.0000 ug | INTRAMUSCULAR | Status: DC | PRN
  Administered 2019-11-08 (×4): 50 ug via INTRAVENOUS

## 2019-11-08 MED ORDER — GABAPENTIN 300 MG PO CAPS
300.0000 mg | ORAL_CAPSULE | Freq: Two times a day (BID) | ORAL | Status: DC
  Administered 2019-11-08 – 2019-11-09 (×3): 300 mg via ORAL
  Filled 2019-11-08 (×3): qty 1

## 2019-11-08 MED ORDER — ACETAMINOPHEN 10 MG/ML IV SOLN
1000.0000 mg | Freq: Once | INTRAVENOUS | Status: AC
  Administered 2019-11-08: 1000 mg via INTRAVENOUS

## 2019-11-08 MED ORDER — ACETAMINOPHEN 325 MG PO TABS: 325.0000 mg | ORAL_TABLET | ORAL | Status: DC | PRN

## 2019-11-08 MED ORDER — HEPARIN SODIUM (PORCINE) 5000 UNIT/ML IJ SOLN
5000.0000 [IU] | Freq: Three times a day (TID) | INTRAMUSCULAR | Status: DC
  Administered 2019-11-08 – 2019-11-10 (×6): 5000 [IU] via SUBCUTANEOUS
  Filled 2019-11-08 (×6): qty 1

## 2019-11-08 MED ORDER — LACTATED RINGERS IR SOLN
Status: DC | PRN
  Administered 2019-11-08: 1000 mL

## 2019-11-08 MED ORDER — LIDOCAINE 2% (20 MG/ML) 5 ML SYRINGE
INTRAMUSCULAR | Status: DC | PRN
  Administered 2019-11-08: 1.5 mg/kg/h via INTRAVENOUS

## 2019-11-08 MED ORDER — ESMOLOL HCL 100 MG/10ML IV SOLN
INTRAVENOUS | Status: DC | PRN
  Administered 2019-11-08: 5 mg via INTRAVENOUS
  Administered 2019-11-08: 10 mg via INTRAVENOUS

## 2019-11-08 MED ORDER — LIDOCAINE 2% (20 MG/ML) 5 ML SYRINGE
INTRAMUSCULAR | Status: AC
  Filled 2019-11-08: qty 15

## 2019-11-08 MED ORDER — OXYCODONE HCL 5 MG PO TABS: 5.0000 mg | ORAL_TABLET | Freq: Once | ORAL | Status: DC | PRN

## 2019-11-08 MED ORDER — BUPIVACAINE LIPOSOME 1.3 % IJ SUSP
INTRAMUSCULAR | Status: DC | PRN
  Administered 2019-11-08: 20 mL

## 2019-11-08 MED ORDER — CHLORHEXIDINE GLUCONATE CLOTH 2 % EX PADS: 6.0000 | MEDICATED_PAD | Freq: Once | CUTANEOUS | Status: DC

## 2019-11-08 MED ORDER — FENTANYL CITRATE (PF) 100 MCG/2ML IJ SOLN
INTRAMUSCULAR | Status: AC
  Filled 2019-11-08: qty 4

## 2019-11-08 MED ORDER — METOPROLOL TARTRATE 5 MG/5ML IV SOLN
5.0000 mg | Freq: Four times a day (QID) | INTRAVENOUS | Status: DC | PRN
  Filled 2019-11-08: qty 5

## 2019-11-08 MED ORDER — ONDANSETRON HCL 4 MG/2ML IJ SOLN: 4.0000 mg | Freq: Four times a day (QID) | INTRAMUSCULAR | Status: DC | PRN

## 2019-11-08 MED ORDER — ESMOLOL HCL 100 MG/10ML IV SOLN
INTRAVENOUS | Status: AC
  Filled 2019-11-08: qty 10

## 2019-11-08 MED ORDER — EPHEDRINE SULFATE-NACL 50-0.9 MG/10ML-% IV SOSY
PREFILLED_SYRINGE | INTRAVENOUS | Status: DC | PRN
  Administered 2019-11-08: 10 mg via INTRAVENOUS

## 2019-11-08 MED ORDER — PROMETHAZINE HCL 25 MG/ML IJ SOLN
12.5000 mg | Freq: Four times a day (QID) | INTRAMUSCULAR | Status: DC | PRN
  Administered 2019-11-08: 12.5 mg via INTRAVENOUS

## 2019-11-08 MED ORDER — LACTATED RINGERS IV SOLN: INTRAVENOUS | Status: DC

## 2019-11-08 MED ORDER — FENTANYL CITRATE (PF) 100 MCG/2ML IJ SOLN
INTRAMUSCULAR | Status: DC | PRN
  Administered 2019-11-08: 50 ug via INTRAVENOUS
  Administered 2019-11-08: 100 ug via INTRAVENOUS
  Administered 2019-11-08: 50 ug via INTRAVENOUS

## 2019-11-08 MED ORDER — INSULIN ASPART 100 UNIT/ML ~~LOC~~ SOLN: 0.0000 [IU] | SUBCUTANEOUS | Status: DC

## 2019-11-08 MED ORDER — PANTOPRAZOLE SODIUM 40 MG IV SOLR
40.0000 mg | Freq: Every day | INTRAVENOUS | Status: DC
  Administered 2019-11-08 – 2019-11-09 (×2): 40 mg via INTRAVENOUS
  Filled 2019-11-08 (×2): qty 40

## 2019-11-08 MED ORDER — ACETAMINOPHEN 160 MG/5ML PO SOLN: 325.0000 mg | ORAL | Status: DC | PRN

## 2019-11-08 MED ORDER — MIDAZOLAM HCL 2 MG/2ML IJ SOLN
INTRAMUSCULAR | Status: AC
  Filled 2019-11-08: qty 2

## 2019-11-08 MED ORDER — FENTANYL CITRATE (PF) 100 MCG/2ML IJ SOLN
25.0000 ug | INTRAMUSCULAR | Status: DC | PRN
  Administered 2019-11-08 (×2): 50 ug via INTRAVENOUS

## 2019-11-08 MED ORDER — 0.9 % SODIUM CHLORIDE (POUR BTL) OPTIME
TOPICAL | Status: DC | PRN
  Administered 2019-11-08: 1000 mL

## 2019-11-08 MED ORDER — ONDANSETRON HCL 4 MG/2ML IJ SOLN
INTRAMUSCULAR | Status: AC
  Administered 2019-11-08: 4 mg via INTRAVENOUS
  Filled 2019-11-08: qty 2

## 2019-11-08 MED ORDER — MIDAZOLAM HCL 5 MG/5ML IJ SOLN
INTRAMUSCULAR | Status: DC | PRN
  Administered 2019-11-08: 1 mg via INTRAVENOUS

## 2019-11-08 MED ORDER — LIDOCAINE HCL 2 % IJ SOLN
INTRAMUSCULAR | Status: AC
  Filled 2019-11-08: qty 20

## 2019-11-08 MED ORDER — FLEET ENEMA 7-19 GM/118ML RE ENEM: 1.0000 | ENEMA | Freq: Once | RECTAL | Status: DC

## 2019-11-08 MED ORDER — ACETAMINOPHEN 325 MG PO TABS
650.0000 mg | ORAL_TABLET | Freq: Four times a day (QID) | ORAL | Status: DC | PRN
  Administered 2019-11-08 – 2019-11-09 (×2): 650 mg via ORAL
  Filled 2019-11-08 (×3): qty 2

## 2019-11-08 MED ORDER — ONDANSETRON HCL 4 MG/2ML IJ SOLN
INTRAMUSCULAR | Status: AC
  Filled 2019-11-08: qty 2

## 2019-11-08 MED ORDER — KCL IN DEXTROSE-NACL 20-5-0.45 MEQ/L-%-% IV SOLN
INTRAVENOUS | Status: DC
  Filled 2019-11-08 (×2): qty 1000

## 2019-11-08 MED ORDER — ACETAMINOPHEN 10 MG/ML IV SOLN
INTRAVENOUS | Status: AC
  Filled 2019-11-08: qty 100

## 2019-11-08 MED ORDER — SUGAMMADEX SODIUM 500 MG/5ML IV SOLN
INTRAVENOUS | Status: AC
  Filled 2019-11-08: qty 5

## 2019-11-08 MED ORDER — SODIUM CHLORIDE 0.9 % IV SOLN
2.0000 g | Freq: Two times a day (BID) | INTRAVENOUS | Status: AC
  Administered 2019-11-08: 2 g via INTRAVENOUS
  Filled 2019-11-08: qty 2

## 2019-11-08 MED ORDER — SUGAMMADEX SODIUM 500 MG/5ML IV SOLN
INTRAVENOUS | Status: DC | PRN
  Administered 2019-11-08: 300 mg via INTRAVENOUS

## 2019-11-08 MED ORDER — PROMETHAZINE HCL 25 MG/ML IJ SOLN
12.5000 mg | Freq: Four times a day (QID) | INTRAMUSCULAR | Status: DC | PRN
  Filled 2019-11-08: qty 1

## 2019-11-08 SURGICAL SUPPLY — 40 items
ADH SKN CLS APL DERMABOND .7 (GAUZE/BANDAGES/DRESSINGS) ×1
APL SKNCLS STERI-STRIP NONHPOA (GAUZE/BANDAGES/DRESSINGS)
APL SWBSTK 6 STRL LF DISP (MISCELLANEOUS) ×2
APPLICATOR COTTON TIP 6 STRL (MISCELLANEOUS) ×2 IMPLANT
APPLICATOR COTTON TIP 6IN STRL (MISCELLANEOUS) ×6
APPLIER CLIP 5 13 M/L LIGAMAX5 (MISCELLANEOUS)
APPLIER CLIP ROT 10 11.4 M/L (STAPLE)
APR CLP MED LRG 11.4X10 (STAPLE)
APR CLP MED LRG 5 ANG JAW (MISCELLANEOUS)
BENZOIN TINCTURE PRP APPL 2/3 (GAUZE/BANDAGES/DRESSINGS) IMPLANT
CLIP APPLIE 5 13 M/L LIGAMAX5 (MISCELLANEOUS) IMPLANT
CLIP APPLIE ROT 10 11.4 M/L (STAPLE) IMPLANT
COVER SURGICAL LIGHT HANDLE (MISCELLANEOUS) ×3 IMPLANT
COVER WAND RF STERILE (DRAPES) IMPLANT
DERMABOND ADVANCED (GAUZE/BANDAGES/DRESSINGS) ×2
DERMABOND ADVANCED .7 DNX12 (GAUZE/BANDAGES/DRESSINGS) ×1 IMPLANT
DRAIN CHANNEL 19F RND (DRAIN) ×2 IMPLANT
ELECT L-HOOK LAP 45CM DISP (ELECTROSURGICAL)
ELECT PENCIL ROCKER SW 15FT (MISCELLANEOUS) IMPLANT
ELECT REM PT RETURN 15FT ADLT (MISCELLANEOUS) ×3 IMPLANT
ELECTRODE L-HOOK LAP 45CM DISP (ELECTROSURGICAL) IMPLANT
EVACUATOR SILICONE 100CC (DRAIN) ×2 IMPLANT
GOWN STRL REUS W/TWL XL LVL3 (GOWN DISPOSABLE) ×9 IMPLANT
HEMOSTAT SURGICEL 4X8 (HEMOSTASIS) IMPLANT
KIT BASIN (CUSTOM PROCEDURE TRAY) ×3 IMPLANT
KIT TURNOVER KIT A (KITS) ×2 IMPLANT
SCISSORS LAP 5X45 EPIX DISP (ENDOMECHANICALS) ×3 IMPLANT
SET IRRIG TUBING LAPAROSCOPIC (IRRIGATION / IRRIGATOR) ×3 IMPLANT
SET TUBE SMOKE EVAC HIGH FLOW (TUBING) ×3 IMPLANT
SHEARS HARMONIC ACE PLUS 45CM (MISCELLANEOUS) ×2 IMPLANT
SLEEVE XCEL OPT CAN 5 100 (ENDOMECHANICALS) ×5 IMPLANT
SPONGE DRAIN TRACH 4X4 STRL 2S (GAUZE/BANDAGES/DRESSINGS) ×2 IMPLANT
SUT ETHILON 2 0 PS N (SUTURE) ×2 IMPLANT
SUT MNCRL AB 4-0 PS2 18 (SUTURE) ×6 IMPLANT
SYR 20ML LL LF (SYRINGE) ×3 IMPLANT
TOWEL OR 17X26 10 PK STRL BLUE (TOWEL DISPOSABLE) ×3 IMPLANT
TRAY LAPAROSCOPIC (CUSTOM PROCEDURE TRAY) ×3 IMPLANT
TROCAR BLADELESS OPT 5 100 (ENDOMECHANICALS) ×5 IMPLANT
TROCAR XCEL BLUNT TIP 100MML (ENDOMECHANICALS) IMPLANT
TROCAR XCEL NON-BLD 11X100MML (ENDOMECHANICALS) IMPLANT

## 2019-11-08 NOTE — Anesthesia Postprocedure Evaluation (Signed)
Anesthesia Post Note  Patient: Jasmine Contreras  Procedure(s) Performed: LAPAROSCOPIC TO DRAIN HEPATIC CYSTS AND UPPER ENDOSCOPY (N/A Abdomen)     Patient location during evaluation: PACU Anesthesia Type: General Level of consciousness: awake and alert Pain management: pain level controlled Vital Signs Assessment: post-procedure vital signs reviewed and stable Respiratory status: spontaneous breathing, nonlabored ventilation, respiratory function stable and patient connected to nasal cannula oxygen Cardiovascular status: blood pressure returned to baseline and stable Postop Assessment: no apparent nausea or vomiting Anesthetic complications: no    Last Vitals:  Vitals:   11/08/19 1405 11/08/19 1450  BP: (!) 152/83 (!) 163/80  Pulse: 62 64  Resp:    Temp:  (!) 36.3 C  SpO2: 99% 100%    Last Pain:  Vitals:   11/08/19 1450  TempSrc: Oral  PainSc:                  Manie Bealer

## 2019-11-08 NOTE — Op Note (Addendum)
Jasmine Contreras  Oct 05, 1950 Nov 08, 2019  PCP:  Laurann Montana, MD   Surgeon: Wenda Low, MD, FACS  Asst:  Phylliss Blakes, MD, FACS  Anes:  general  Preop Dx: Multiple liver cyst Postop Dx: Hepatotomy x9 (unroofing of 9 bilobar hepatic cysts)  Procedure: Laparoscopy with Harmonic scalpel hepatotomy and  unroofing of 9 cysts including the two right lateral ones that were causing her the most pain.  Enterolysis;  Upper endoscopy Location Surgery: [facility] Complications: None noted  EBL:   25  cc  Drains: 19 blake drain in the right upper quadrant into cyst containing bilious material  Description of Procedure:  The patient was taken to OR 1 .  After anesthesia was administered and the patient was prepped  with betadine  and a timeout was performed.  Access to the abdomen was achieved with a 5 mm Optiview through the right upper quadrant.  Six of the 5 mm trocars were eventually placed.  At the end of the case these were injected with Exparel under laparoscopic vision.  Midline adhesions were taken down with scissors and the Harmonic scalpel.  Most but not all were lysed and no bowel injury was detected.    The cyst in the right lobe were addressed first.  Laterally two cysts were unroofed with the Harmonic scalpel.  In the middle of the right lobe there were two more cyst drained in this fashion.  All had clear fluid.  In the medial aspect of the left lobe there were three cysts that were opened.  The first had light green bilious material.  Inside there was an [place] of liver; no ongoing bilious drainage was seen.  Two other cysts nearby were opened and drained.  On the left lateral segement there were two more cysts that were unroofed and drained.    Endoscopy was performed by me and the resected stomach and gastrojejunostomy were visualized and insufflated.  Simulatanously were were looking for bubbling from the cyst that had the bilious drainage and saw no evidence of enteric connection.   The gastrojejunostomy appeared to be a roux en Y and this was away from the cyst with the bilious material.    A 19 Blake drain was inserted through the right lateral port in to the bilious cyst.  It was secured with a 2-0 nylon.  Abdomen was desufflated and the incisions were closed with 4-0 Monocryl and Dermabond.    The patient tolerated the procedure well and was taken to the PACU in stable condition.     Matt B. Daphine Deutscher, MD, [facility], [place] [phone]

## 2019-11-08 NOTE — Interval H&P Note (Signed)
History and Physical Interval Note:  11/08/2019 7:12 AM  Jasmine Contreras  has presented today for surgery, with the diagnosis of PAINFUL HEPATIC CYSTS.  The various methods of treatment have been discussed with the patient and family. After consideration of risks, benefits and other options for treatment, the patient has consented to  Procedure(s): LAPAROSCOPIC OR LAPAROTOMY TO DRAIN HEPATIC CYSTS (N/A) as a surgical intervention.  The patient's history has been reviewed, patient examined, no change in status, stable for surgery.  I have reviewed the patient's chart and labs.  Questions were answered to the patient's satisfaction.     Valarie Merino

## 2019-11-08 NOTE — Progress Notes (Signed)
May give an additional fentanyl, IV tylenol 1g, and then may give Dilaudid per pacu protocol - per Dr Miguel Rota.

## 2019-11-08 NOTE — Transfer of Care (Signed)
Immediate Anesthesia Transfer of Care Note  Patient: Jasmine Contreras  Procedure(s) Performed: Procedure(s): LAPAROSCOPIC TO DRAIN HEPATIC CYSTS AND UPPER ENDOSCOPY (N/A)  Patient Location: PACU  Anesthesia Type:General  Level of Consciousness:  sedated, patient cooperative and responds to stimulation  Airway & Oxygen Therapy:Patient Spontanous Breathing and Patient connected to face mask oxgen  Post-op Assessment:  Report given to PACU RN and Post -op Vital signs reviewed and stable  Post vital signs:  Reviewed and stable  Last Vitals:  Vitals:   11/08/19 0615  BP: (!) 156/88  Pulse: 64  Resp: 18  Temp: 36.9 C  SpO2: 100%    Complications: No apparent anesthesia complications

## 2019-11-08 NOTE — Anesthesia Procedure Notes (Signed)
Procedure Name: Intubation Date/Time: 11/08/2019 7:48 AM Performed by: Lavina Hamman, CRNA Pre-anesthesia Checklist: Patient identified, Emergency Drugs available, Suction available, Patient being monitored and Timeout performed Patient Re-evaluated:Patient Re-evaluated prior to induction Oxygen Delivery Method: Circle system utilized Preoxygenation: Pre-oxygenation with 100% oxygen Induction Type: IV induction Ventilation: Mask ventilation without difficulty Laryngoscope Size: Mac and 4 Grade View: Grade I Tube type: Oral Tube size: 7.5 mm Number of attempts: 1 Airway Equipment and Method: Stylet Placement Confirmation: ETT inserted through vocal cords under direct vision,  positive ETCO2,  CO2 detector and breath sounds checked- equal and bilateral Secured at: 22 cm Tube secured with: Tape Dental Injury: Teeth and Oropharynx as per pre-operative assessment

## 2019-11-08 NOTE — Progress Notes (Signed)
Patient refusing finger sticks and insulin, Patient educated but insist not to have finger sticks tonight.

## 2019-11-09 DIAGNOSIS — Z91040 Latex allergy status: Secondary | ICD-10-CM | POA: Diagnosis not present

## 2019-11-09 DIAGNOSIS — Q446 Cystic disease of liver: Secondary | ICD-10-CM | POA: Diagnosis present

## 2019-11-09 DIAGNOSIS — Z885 Allergy status to narcotic agent status: Secondary | ICD-10-CM | POA: Diagnosis not present

## 2019-11-09 DIAGNOSIS — I10 Essential (primary) hypertension: Secondary | ICD-10-CM | POA: Diagnosis present

## 2019-11-09 DIAGNOSIS — Z6838 Body mass index (BMI) 38.0-38.9, adult: Secondary | ICD-10-CM | POA: Diagnosis not present

## 2019-11-09 DIAGNOSIS — Z888 Allergy status to other drugs, medicaments and biological substances status: Secondary | ICD-10-CM | POA: Diagnosis not present

## 2019-11-09 LAB — COMPREHENSIVE METABOLIC PANEL
ALT: 27 U/L (ref 0–44)
AST: 34 U/L (ref 15–41)
Albumin: 3 g/dL — ABNORMAL LOW (ref 3.5–5.0)
Alkaline Phosphatase: 83 U/L (ref 38–126)
Anion gap: 7 (ref 5–15)
BUN: 8 mg/dL (ref 8–23)
CO2: 26 mmol/L (ref 22–32)
Calcium: 7.8 mg/dL — ABNORMAL LOW (ref 8.9–10.3)
Chloride: 109 mmol/L (ref 98–111)
Creatinine, Ser: 0.73 mg/dL (ref 0.44–1.00)
GFR calc Af Amer: >60 mL/min (ref >60)
GFR calc non Af Amer: >60 mL/min (ref >60)
Glucose, Bld: 110 mg/dL — ABNORMAL HIGH (ref 70–99)
Potassium: 3 mmol/L — ABNORMAL LOW (ref 3.5–5.1)
Sodium: 142 mmol/L (ref 135–145)
Total Bilirubin: 0.9 mg/dL (ref 0.3–1.2)
Total Protein: 6.1 g/dL — ABNORMAL LOW (ref 6.5–8.1)

## 2019-11-09 LAB — CBC
HCT: 32.3 % — ABNORMAL LOW (ref 36.0–46.0)
Hemoglobin: 9.9 g/dL — ABNORMAL LOW (ref 12.0–15.0)
MCH: 25 pg — ABNORMAL LOW (ref 26.0–34.0)
MCHC: 30.7 g/dL (ref 30.0–36.0)
MCV: 81.6 fL (ref 80.0–100.0)
Platelets: 238 10*3/uL (ref 150–400)
RBC: 3.96 MIL/uL (ref 3.87–5.11)
RDW: 16.7 % — ABNORMAL HIGH (ref 11.5–15.5)
WBC: 7.1 10*3/uL (ref 4.0–10.5)
nRBC: 0 % (ref 0.0–0.2)

## 2019-11-09 MED ORDER — TORSEMIDE 20 MG PO TABS
30.0000 mg | ORAL_TABLET | Freq: Every day | ORAL | Status: DC
  Administered 2019-11-09: 30 mg via ORAL
  Filled 2019-11-09 (×3): qty 1

## 2019-11-09 MED ORDER — METOPROLOL SUCCINATE ER 25 MG PO TB24
25.0000 mg | ORAL_TABLET | Freq: Every day | ORAL | Status: DC
  Administered 2019-11-09: 25 mg via ORAL
  Filled 2019-11-09: qty 1

## 2019-11-09 MED ORDER — POTASSIUM CHLORIDE CRYS ER 20 MEQ PO TBCR
40.0000 meq | EXTENDED_RELEASE_TABLET | Freq: Two times a day (BID) | ORAL | Status: DC
  Administered 2019-11-09: 40 meq via ORAL
  Filled 2019-11-09: qty 2

## 2019-11-09 NOTE — Progress Notes (Signed)
Patient c/o feeling fluid buildup in legs and chest; small amount of crackles heard; pt not dyspneic.

## 2019-11-09 NOTE — Progress Notes (Signed)
1 Day Post-Op   Subjective/Chief Complaint: Still with moderate pain today   Objective: Vital signs in last 24 hours: Temp:  [97.4 F (36.3 C)-98.9 F (37.2 C)] 98.2 F (36.8 C) (05/08 0549) Pulse Rate:  [56-78] 70 (05/08 0549) Resp:  [12-22] 17 (05/08 0549) BP: (128-171)/(74-88) 142/76 (05/08 0549) SpO2:  [92 %-100 %] 92 % (05/08 0549) Last BM Date: 11/07/19  Intake/Output from previous day: 05/07 0701 - 05/08 0700 In: 3254.7 [P.O.:600; I.V.:2554.7; IV Piggyback:100] Out: 745 [Urine:550; Drains:145; Blood:50] Intake/Output this shift: No intake/output data recorded.  Exam: Awake and alert Abdomen soft, tender, drain serosang  Lab Results:  Recent Labs    11/08/19 1320 11/09/19 0449  WBC 10.2 7.1  HGB 11.3* 9.9*  HCT 36.9 32.3*  PLT 259 238   BMET Recent Labs    11/08/19 1320 11/09/19 0449  NA  --  142  K  --  3.0*  CL  --  109  CO2  --  26  GLUCOSE  --  110*  BUN  --  8  CREATININE 0.84 0.73  CALCIUM  --  7.8*   PT/INR No results for input(s): LABPROT, INR in the last 72 hours. ABG No results for input(s): PHART, HCO3 in the last 72 hours.  Invalid input(s): PCO2, PO2  Studies/Results: No results found.  Anti-infectives: Anti-infectives (From admission, onward)   Start     Dose/Rate Route Frequency Ordered Stop   11/08/19 2000  cefoTEtan (CEFOTAN) 2 g in sodium chloride 0.9 % 100 mL IVPB     2 g 200 mL/hr over 30 Minutes Intravenous Every 12 hours 11/08/19 1219 11/08/19 2022   11/08/19 0600  cefoTEtan (CEFOTAN) 2 g in sodium chloride 0.9 % 100 mL IVPB     2 g 200 mL/hr over 30 Minutes Intravenous On call to O.R. 11/08/19 0534 11/08/19 1604      Assessment/Plan: s/p Procedure(s): LAPAROSCOPIC TO DRAIN HEPATIC CYSTS AND UPPER ENDOSCOPY (N/A)  Continue pain control Decrease IVF Follow Hgb To go home with drain at discharge  LOS: 1 day    Abigail Miyamoto 11/09/2019

## 2019-11-09 NOTE — Progress Notes (Signed)
Patient refusing CBG checks and insulin. Stated that she is not a diabetic and does not need insulin or CBGs. Edison Nasuti RN MSN

## 2019-11-10 LAB — CBC
HCT: 35.2 % — ABNORMAL LOW (ref 36.0–46.0)
Hemoglobin: 11 g/dL — ABNORMAL LOW (ref 12.0–15.0)
MCH: 25.5 pg — ABNORMAL LOW (ref 26.0–34.0)
MCHC: 31.3 g/dL (ref 30.0–36.0)
MCV: 81.5 fL (ref 80.0–100.0)
Platelets: 252 10*3/uL (ref 150–400)
RBC: 4.32 MIL/uL (ref 3.87–5.11)
RDW: 16.7 % — ABNORMAL HIGH (ref 11.5–15.5)
WBC: 6.5 10*3/uL (ref 4.0–10.5)
nRBC: 0 % (ref 0.0–0.2)

## 2019-11-10 NOTE — Progress Notes (Signed)
Patient ID: Jasmine Contreras, female   DOB: [birth year], 69 y.o.   MRN: [id number]  Doing well Wants to go home Abdomen soft, minimally tender Drain serosang  Plan: Discharge home

## 2019-11-10 NOTE — Progress Notes (Signed)
Went over drain care.

## 2019-11-10 NOTE — Discharge Summary (Signed)
Physician Discharge Summary  Patient ID: Jasmine Contreras MRN: [id number] DOB/AGE: 1950/12/23 69 y.o.  Admit date: 11/08/2019 Discharge date: 11/10/2019  Admission Diagnoses:  Discharge Diagnoses:  Active Problems:   Polycystic liver disease, congenital   Polycystic liver disease   Discharged Condition: good  Hospital Course: uneventful postop recovery. Discharged home POD#2  Consults: None  Significant Diagnostic Studies:   Treatments: surgery: laparoscopic liver cyst unroofing  Discharge Exam: Blood pressure (!) 141/68, pulse 65, temperature 97.7 F (36.5 C), temperature source Oral, resp. rate 14, height 5\' 10"  (1.778 m), weight 122.5 kg, SpO2 95 %. General appearance: alert, cooperative and no distress Resp: clear to auscultation bilaterally Cardio: regular rate and rhythm, S1, S2 normal, no murmur, click, rub or gallop Incision/Wound:abdomen soft, incisions clean, drain serosang  Disposition: Discharge disposition: 01-Home or Self Care        Allergies as of 11/10/2019      Reactions   Honey Bee Treatment [bee Venom] Anaphylaxis, Swelling   Body swells   Other Other (See Comments)   Has diverticulitis- cannot have ANY FOODS WITH SEEDS!!   Shellfish-derived Products Anaphylaxis   Strawberry Extract Anaphylaxis   Peanuts [peanut Oil] Hives   Abilify [aripiprazole] Other (See Comments)   Somnolence   Adhesive [tape] Hives, Itching   Codeine Itching, Nausea And Vomiting   Hydrocodone Itching, Nausea And Vomiting   Latex Hives, Itching   Meclizine Hcl Nausea Only   Oxycodone Hcl Itching, Nausea And Vomiting   Prednisone Other (See Comments), Hypertension   "Makes my face turn red, also"   Scopace [scopolamine] Nausea Only   Sertraline Other (See Comments)   Suicidal thoughts   Tavist-d [albertsons Dayhist-d] Other (See Comments)   Makes my nose drain   Tramadol Nausea Only      Medication List    TAKE these medications   acetaminophen 325 MG  tablet Commonly known as: TYLENOL Take 325-650 mg by mouth every 6 (six) hours as needed (for pain or headaches).   allopurinol 100 MG tablet Commonly known as: ZYLOPRIM Take 100 mg by mouth daily.   BIOTIN FORTE PO Take 1 capsule by mouth every other day.   colchicine 0.6 MG tablet Take 0.6 mg by mouth daily as needed (as directed for gout flares).   EPINEPHrine 0.3 mg/0.3 mL Soaj injection Commonly known as: EPI-PEN Inject 0.3 mg into the muscle once as needed for anaphylaxis.   indomethacin 50 MG capsule Commonly known as: INDOCIN Take 50 mg by mouth 2 (two) times daily as needed (as directed for pain/gout flares).   loratadine 10 MG tablet Commonly known as: CLARITIN Take 10 mg by mouth daily.   metFORMIN 500 MG tablet Commonly known as: GLUCOPHAGE Take 1 tablet (500 mg total) by mouth daily with breakfast.   metoprolol succinate 25 MG 24 hr tablet Commonly known as: TOPROL-XL Take 25 mg by mouth at bedtime.   nitroGLYCERIN 0.4 MG SL tablet Commonly known as: NITROSTAT Place 0.4 mg under the tongue every 5 (five) minutes as needed for chest pain.   NON FORMULARY Place 2 drops under the tongue See admin instructions. CBD oil: Place 2 drops under the tongue at bedtime as needed for sleep   pantoprazole 40 MG tablet Commonly known as: Protonix Take 30- 60 min before your first and last meals of the day What changed:   how much to take  how to take this  when to take this  additional instructions   potassium chloride SA 20 MEQ  tablet Commonly known as: KLOR-CON Take 40 mEq by mouth daily.   scopolamine 1 MG/3DAYS Commonly known as: TRANSDERM-SCOP Place 1 patch onto the skin every 3 (three) days as needed (for motion sickness).   sucralfate 1 g tablet Commonly known as: CARAFATE Take 1 g by mouth 2 (two) times daily.   torsemide 20 MG tablet Commonly known as: DEMADEX Take 30 mg by mouth daily.   vitamin B-12 1000 MCG tablet Commonly known as:  CYANOCOBALAMIN Take 1,000 mcg by mouth 2 (two) times a week.   Vitamin D3 250 MCG (10000 UT) Tabs Take 20,000 Units by mouth daily.   zinc gluconate 50 MG tablet Take 50 mg by mouth daily.      Follow-up Information    Johnathan Hausen, MD. Schedule an appointment as soon as possible for a visit in 1 week(s).   Specialty: General Surgery Why: call office to set up appointment to see Dr. Hassell Done this week Contact information: [place] [address] [phone]           Signed: Coralie Keens 11/10/2019, 8:06 AM

## 2019-11-10 NOTE — Discharge Instructions (Signed)
[facility] ______[facility] °LAPAROSCOPIC SURGERY: POST OP INSTRUCTIONS °Always review your discharge instruction sheet given to you by the facility where your surgery was performed. °IF YOU HAVE DISABILITY OR FAMILY LEAVE FORMS, YOU MUST BRING THEM TO THE OFFICE FOR PROCESSING.   °DO NOT GIVE THEM TO YOUR DOCTOR. ° °1. A prescription for pain medication may be given to you upon discharge.  Take your pain medication as prescribed, if needed.  If narcotic pain medicine is not needed, then you may take acetaminophen (Tylenol) or ibuprofen (Advil) as needed. °2. Take your usually prescribed medications unless otherwise directed. °3. If you need a refill on your pain medication, please contact your pharmacy.  They will contact our office to request authorization. Prescriptions will not be filled after 5pm or on week-ends. °4. You should follow a light diet the first few days after arrival home, such as soup and crackers, etc.  Be sure to include lots of fluids daily. °5. Most patients will experience some swelling and bruising in the area of the incisions.  Ice packs will help.  Swelling and bruising can take several days to resolve.  °6. It is common to experience some constipation if taking pain medication after surgery.  Increasing fluid intake and taking a stool softener (such as Colace) will usually help or prevent this problem from occurring.  A mild laxative (Milk of Magnesia or Miralax) should be taken according to package instructions if there are no bowel movements after 48 hours. °7. Unless discharge instructions indicate otherwise, you may remove your bandages 24-48 hours after surgery, and you may shower at that time.  You may have steri-strips (small skin tapes) in place directly over the incision.  These strips should be left on the skin for 7-10 days.  If your surgeon used skin glue on the incision, you may shower in 24 hours.  The glue will flake off over the next 2-3 weeks.  Any sutures or  staples will be removed at the office during your follow-up visit. °8. ACTIVITIES:  You may resume regular (light) daily activities beginning the next day--such as daily self-care, walking, climbing stairs--gradually increasing activities as tolerated.  You may have sexual intercourse when it is comfortable.  Refrain from any heavy lifting or straining until approved by your doctor. °a. You may drive when you are no longer taking prescription pain medication, you can comfortably wear a seatbelt, and you can safely maneuver your car and apply brakes. °b. RETURN TO WORK:  __________________________________________________________ °9. You should see your doctor in the office for a follow-up appointment approximately 2-3 weeks after your surgery.  Make sure that you call for this appointment within a day or two after you arrive home to insure a convenient appointment time. °10. OTHER INSTRUCTIONS: __________________________________________________________________________________________________________________________ __________________________________________________________________________________________________________________________ °WHEN TO CALL YOUR DOCTOR: °1. Fever over 101.0 °2. Inability to urinate °3. Continued bleeding from incision. °4. Increased pain, redness, or drainage from the incision. °5. Increasing abdominal pain ° °The clinic staff is available to answer your questions during regular business hours.  Please don’t hesitate to call and ask to speak to one of the nurses for clinical concerns.  If you have a medical emergency, go to the nearest emergency room or call 911.  A surgeon from [facility] is always on call at the hospital. °[address], [address], [place], [place]  [address] ? [address] [address], [place], [place]   [address] °[phone] ? [phone] ? FAX [phone] °Web site:   [email or web address] °

## 2019-11-10 NOTE — Progress Notes (Signed)
Mailing discharge packet to home address per pt request. Went over paperwork via telephone. Husband performed dressing change and emptied drain this morning.

## 2019-12-16 IMAGING — CT CT GUIDANCE NEEDLE PLACEMENT
1 of 5 series · 8 of 32 positions shown, 13 images · non-contrast
Comparison: CT of the chest, abdomen and pelvis - 11/15/2018

INDICATION: Right upper quadrant abdominal pain. Concern for symptomatic hepatic
cysts.

Please perform image guided hepatic cyst aspiration/drainage
catheter placement for diagnostic and therapeutic purposes.
EXAM:
ULTRASOUND AND CT-GUIDED HEPATIC CYST ASPIRATION X2

[Series 2: i-spiral 5.0 b40f · axial · 0.78mm/px · z∈[+1285,+1436]mm · 8 of 57 slices shown, 13 images]
[im 7/57  soft-tissue]
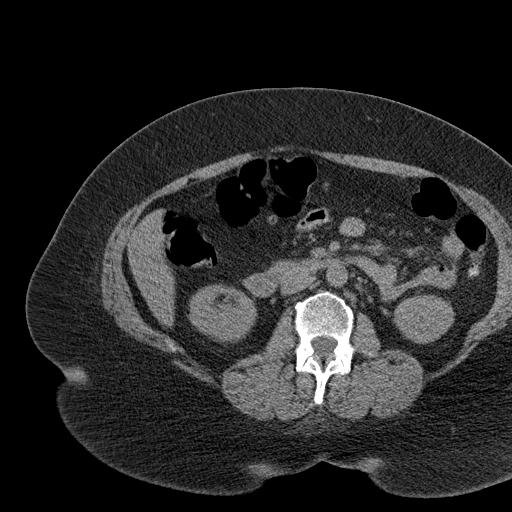
[im 7/57  bone]
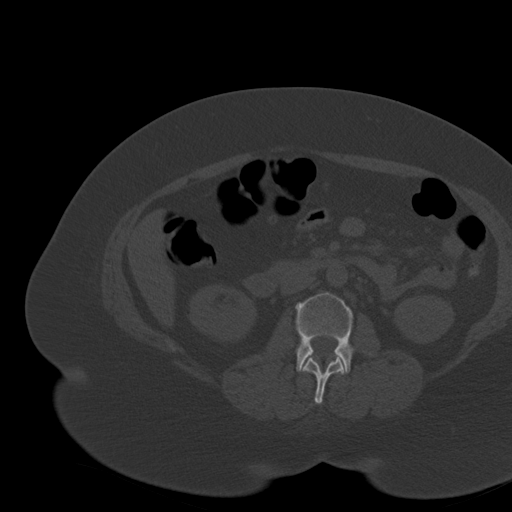
[im 13/57  soft-tissue]
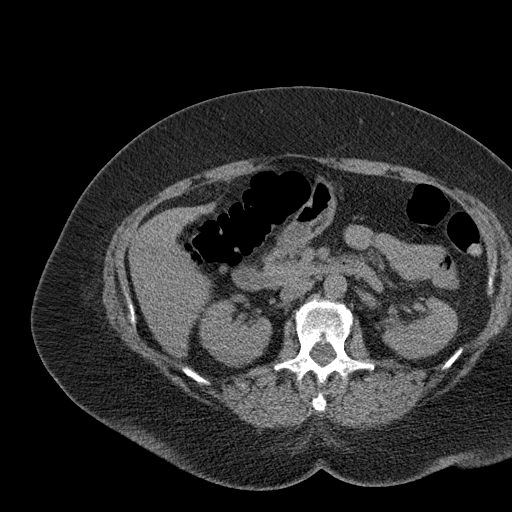
[im 19/57  soft-tissue]
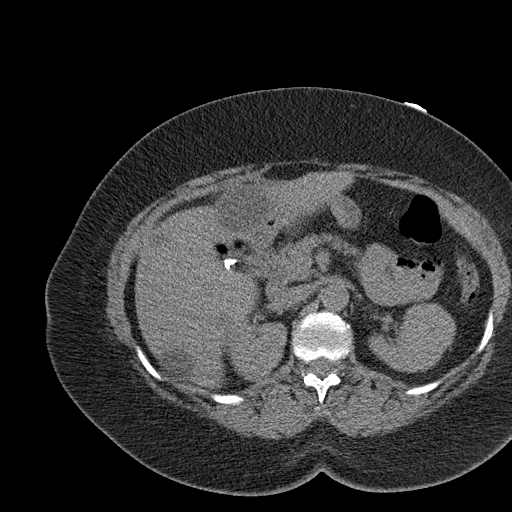
[im 25/57  soft-tissue]
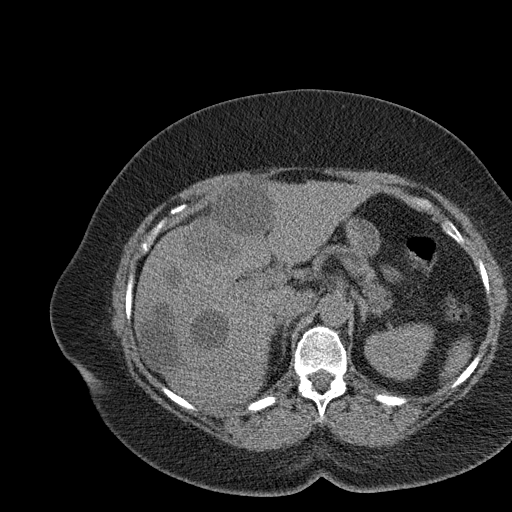
[im 32/57  soft-tissue]
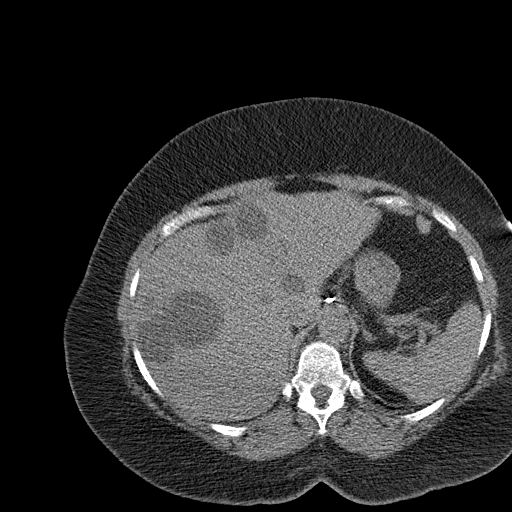
[im 32/57  lung]
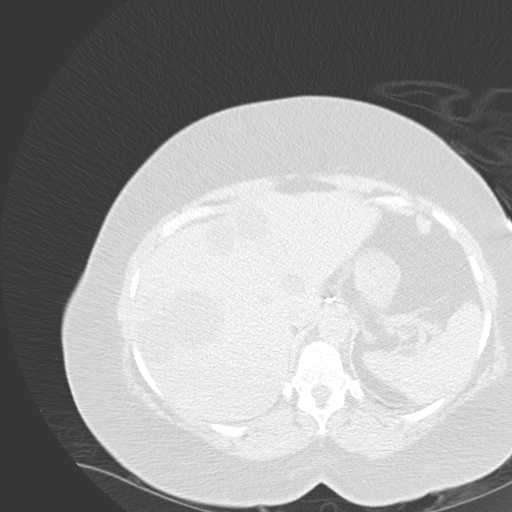
[im 38/57  soft-tissue]
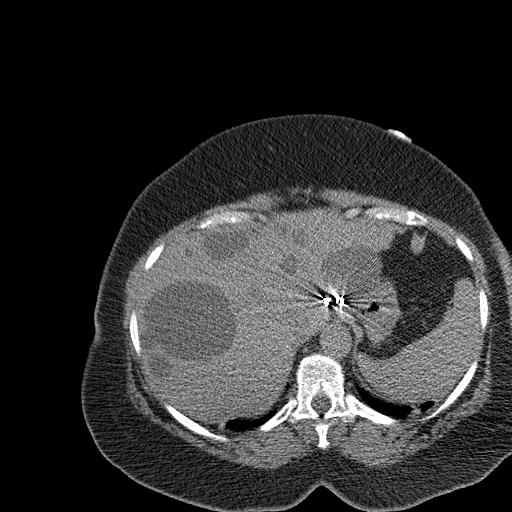
[im 38/57  lung]
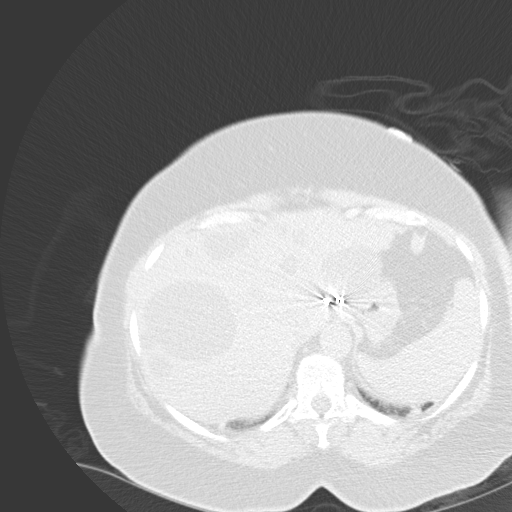
[im 44/57  soft-tissue]
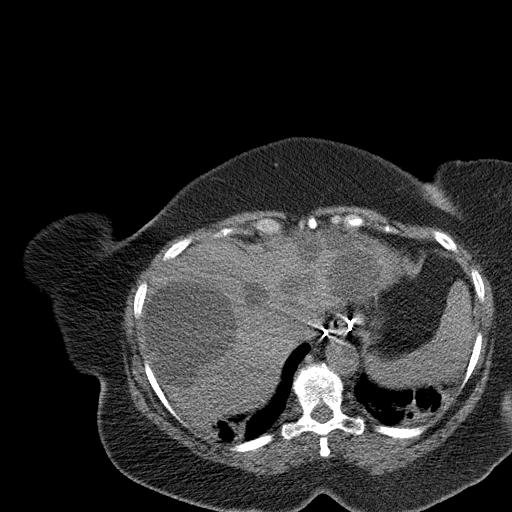
[im 44/57  lung]
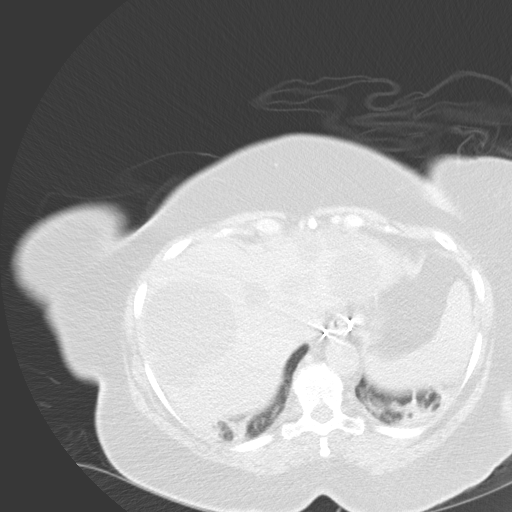
[im 50/57  soft-tissue]
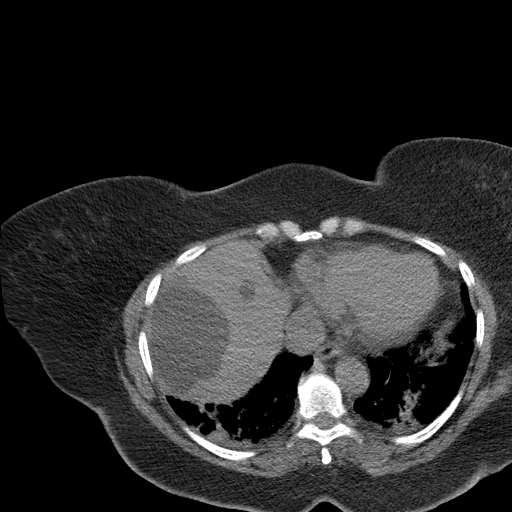
[im 50/57  lung]
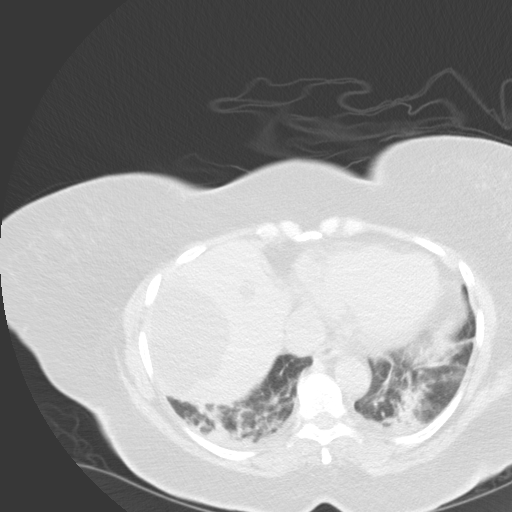

[8 of 32 positions shown; findings below may reference images not displayed]

MEDICATIONS:
The patient is currently admitted to the hospital and receiving
intravenous antibiotics. The antibiotics were administered within an
appropriate time frame prior to the initiation of the procedure.

ANESTHESIA/SEDATION:
Moderate (conscious) sedation was employed during this procedure. A
total of Versed 2 mg and Fentanyl 100 mcg was administered
intravenously.

Moderate Sedation Time: 28 minutes. The patient's level of
consciousness and vital signs were monitored continuously by
radiology nursing throughout the procedure under my direct
supervision.

CONTRAST:  None

COMPLICATIONS:
None immediate.

PROCEDURE:
Informed written consent was obtained from patient after a
discussion of the risks, benefits and alternatives to treatment. The
patient was placed supine on the CT gantry and a pre procedural CT
was performed re-demonstrating the known multiple hepatic cysts with
dominant cyst within the subcapsular aspect of the right lobe of the
liver measuring approximately 8.8 x 6.9 cm (image 11, series 2) and
additional partially exophytic cyst arising from the medial segment
of the left lobe of the liver measuring approximately 5.1 x 4.6 cm
(image 35, series 2). The procedures were planned. A timeout was
performed prior to the initiation of the procedure.

The skin overlying the right upper abdomen was prepped and draped in
the usual sterile fashion. The overlying soft tissues were
anesthetized with 1% lidocaine with epinephrine.

Under direct ultrasound guidance, the dominant cyst within the dome
of the right lobe of the liver was accessed with a 18 gauge trocar
needle. Ultrasound image was saved for procedural documentation
purposes. An Amplatz wire was coiled within the collection.
Appropriate position was confirmed with CT imaging. Next, the trocar
needle was exchanged for Kaki Jim catheter. Approximately 250
cc of blood-tinged serous fluid was aspirated as the catheter was
slowly retracted.

The identical procedure was repeated for the additional exophytic
lesion within the left lobe of the liver yielding 50 cc of serous
fluid.

Samples from both cysts were sent separately to the laboratory for
analysis.

Postprocedural CT imaging demonstrates complete resolution of both
dominant potentially symptomatic hepatic cysts.

Dressings were applied. The patient tolerated the procedure well
without immediate post procedural complication.
IMPRESSION: 1. Successful ultrasound and CT-guided aspiration 250 cc of blood
tinged serous fluid from the dominant cyst within the dome of the
right lobe of the liver.
2. Successful ultrasound and CT-guided aspiration of 50 cc of serous
fluid from the dominant partially exophytic cyst arising from the
left lobe of the liver.
3. Samples from both aspirated cysts were sent separately to the
laboratory for analysis.

## 2020-01-02 IMAGING — US ULTRASOUND ABDOMEN LIMITED
1 series · 13 of 25 positions shown · non-contrast
Comparison: 11/15/2018

CLINICAL DATA: Status post liver cyst aspiration 11/16/2018

EXAM:
ULTRASOUND ABDOMEN LIMITED RIGHT UPPER QUADRANT

[Series 1: ultrasound abdomen limited · 0.23mm/px · 52 acquisitions, 13 frames shown]
[im 1/52]
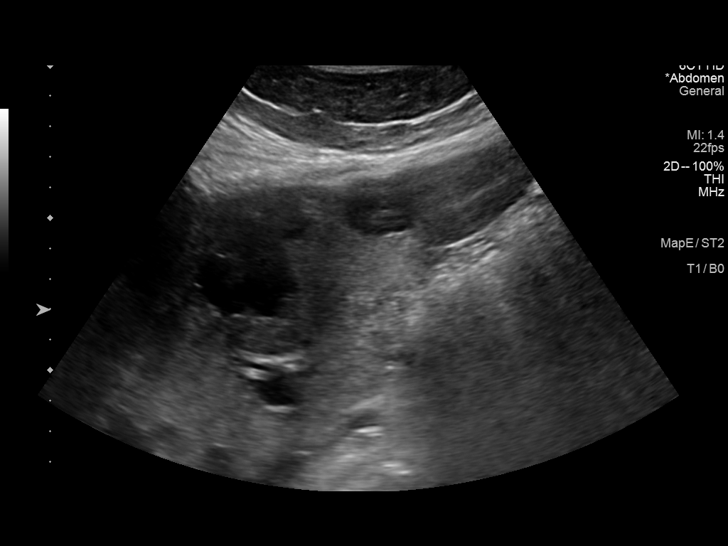
[im 5/52]
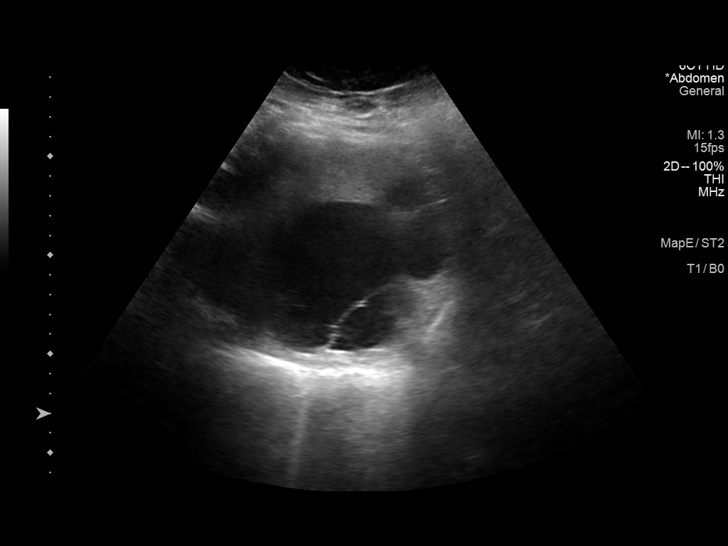
[im 9/52]
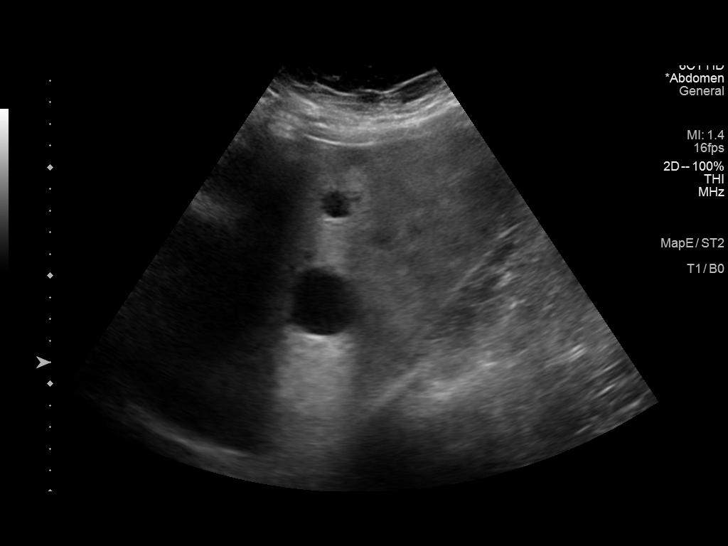
[im 13/52]
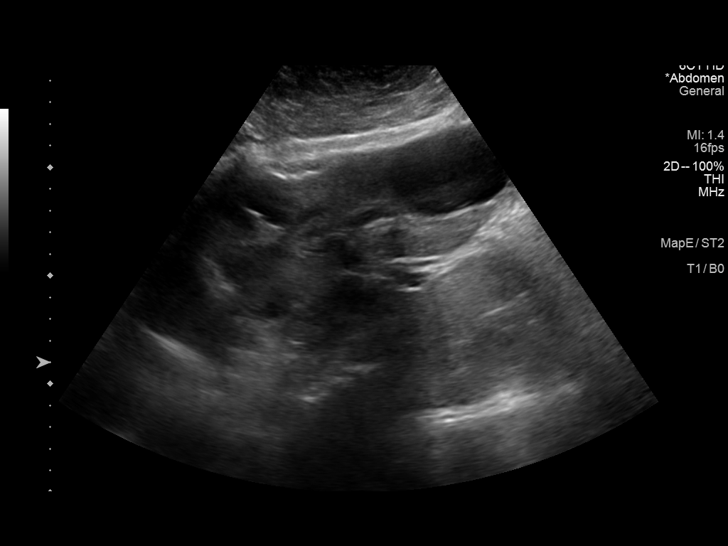
[im 18/52]
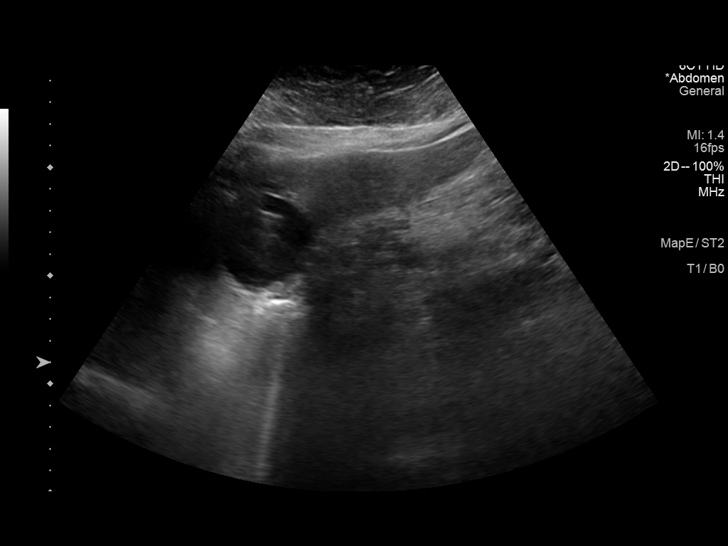
[im 22/52]
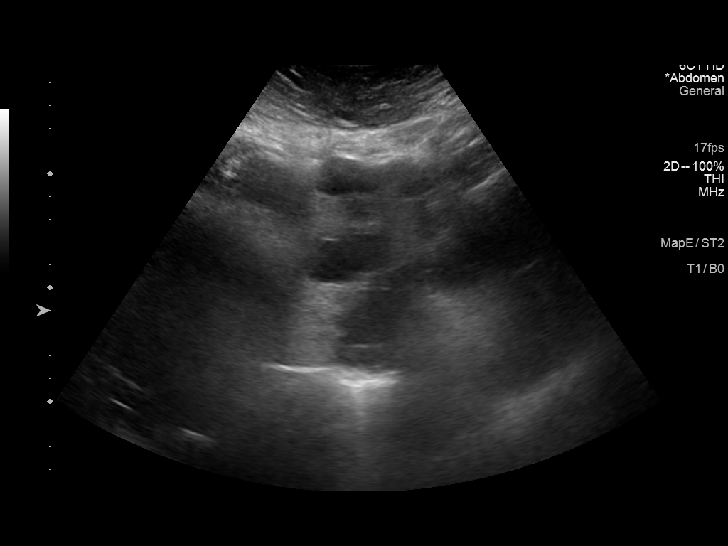
[im 26/52]
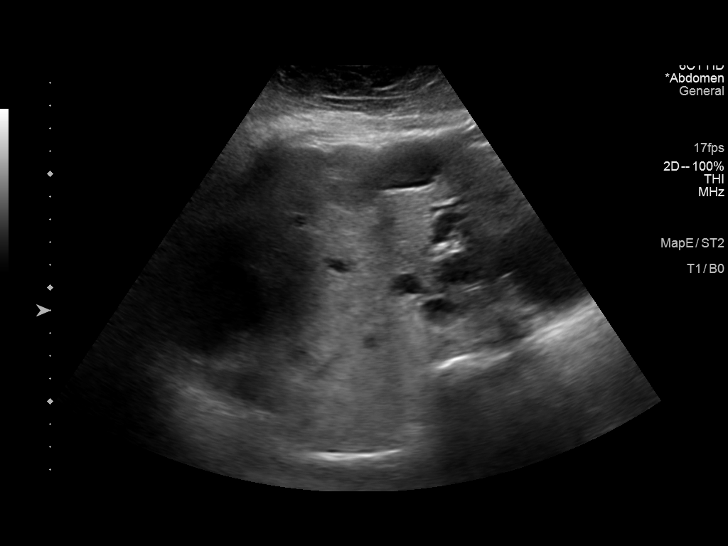
[im 30/52]
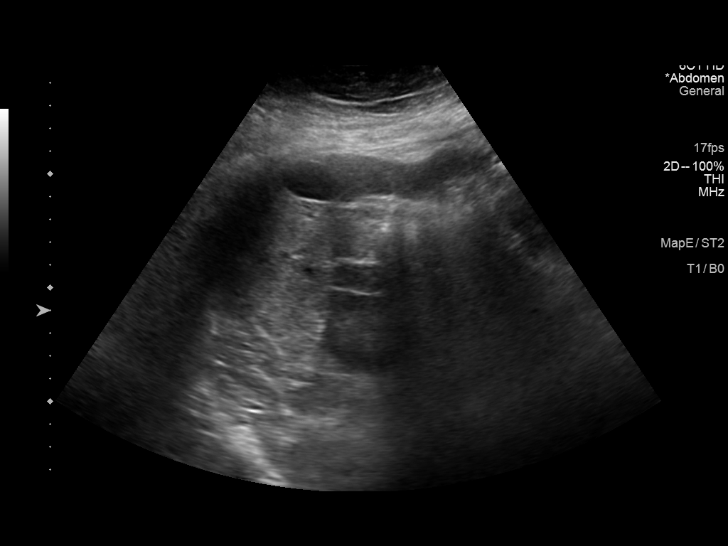
[im 35/52]
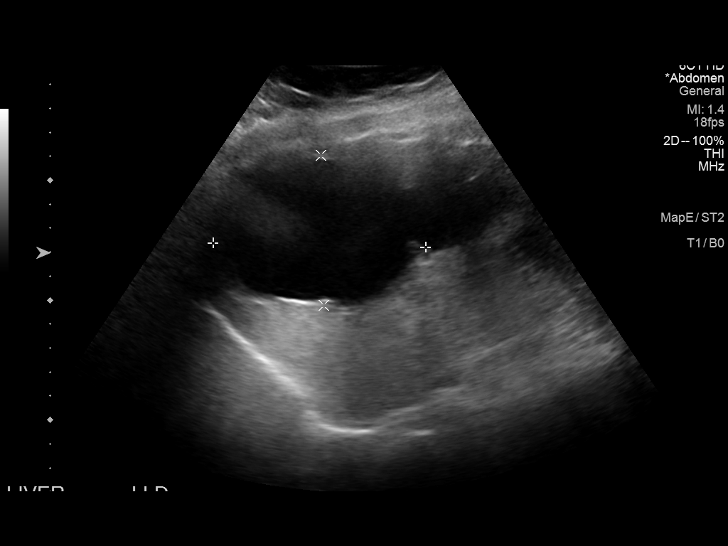
[im 39/52]
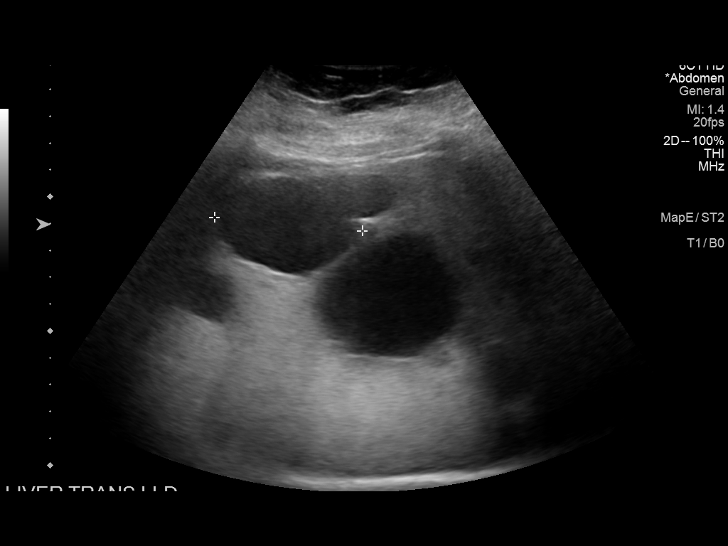
[im 43/52]
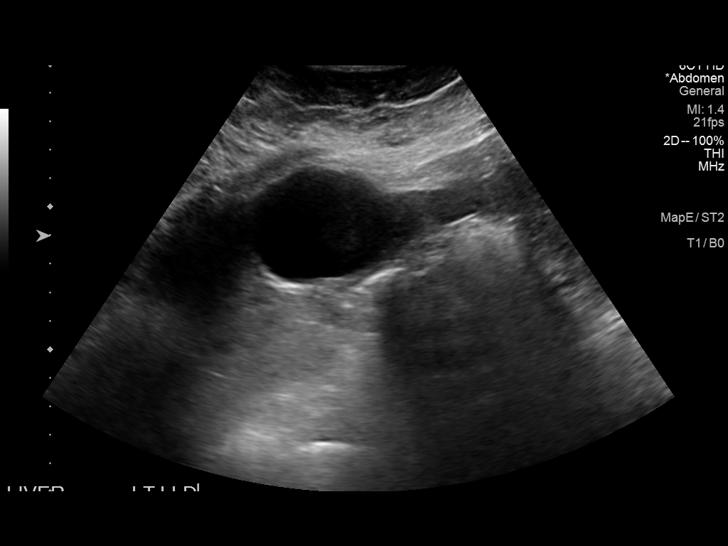
[im 47/52]
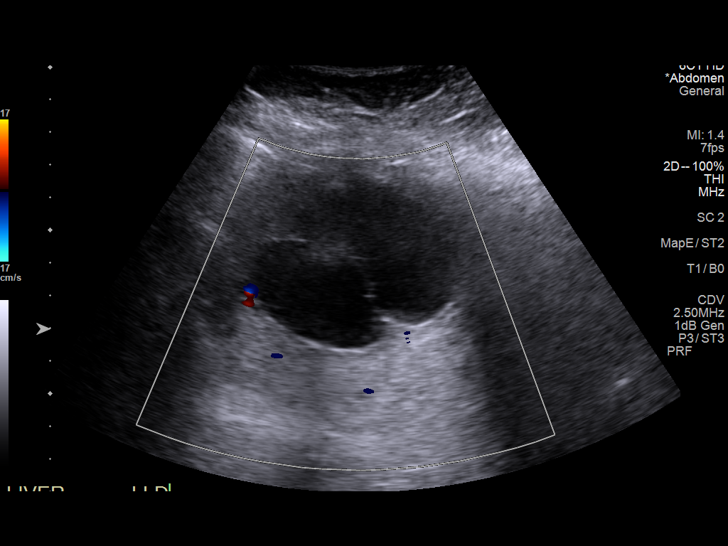
[im 52/52]
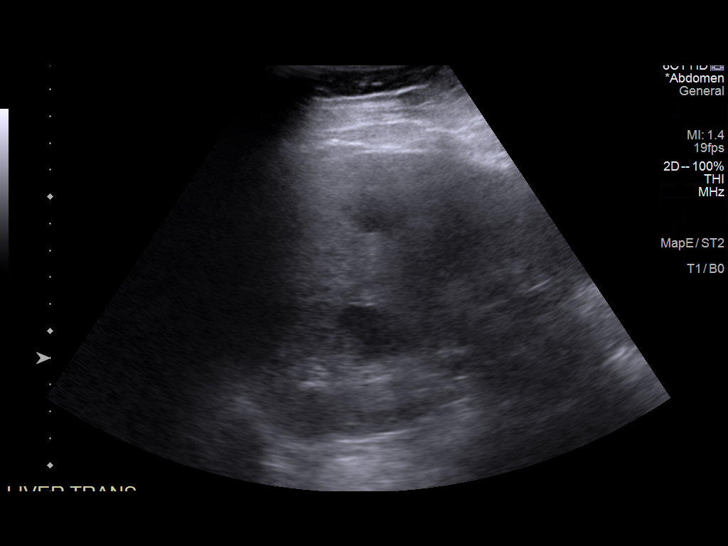

[13 of 25 positions shown; findings below may reference images not displayed]

FINDINGS: Gallbladder:

No gallstones or wall thickening visualized. No sonographic Murphy
sign noted by sonographer.

Common bile duct:

Diameter: 3.5 mm in caliber.

Liver:

Innumerable cysts are scattered throughout the liver. There are no
definite solid elements. Some of these cysts do contain septations.
The largest in the right lobe measures 8.9 x 6.3 x 7.6 cm.
Previously, this measured up to 8.8 cm. An adjacent cyst in the
right lobe measures 5.7 x 4.0 x 5.5 cm. A third right lobe cyst
measures 5.6 x 4.4 x 4.5 cm and previously measured up to 5.1 cm. A
4 cysts in the left lobe measures 6.2 x 4.0 x 5.4 cm in previously
measured up to 5.1 cm. No definite solid masses are present.
Cortical echogenicity in the liver is within normal limits. Portal
vein is patent on color Doppler imaging with normal direction of
blood flow towards the liver.
IMPRESSION: Multiple benign appearing cysts are scattered throughout the liver.
They are not significantly changed.

Gallbladder and biliary tree are within normal limits.

## 2020-02-07 ENCOUNTER — Other Ambulatory Visit: Payer: Self-pay | Admitting: Surgery

## 2020-02-07 DIAGNOSIS — K7689 Other specified diseases of liver: Secondary | ICD-10-CM

## 2020-02-10 ENCOUNTER — Other Ambulatory Visit: Payer: Self-pay | Admitting: Surgery

## 2020-02-10 ENCOUNTER — Ambulatory Visit
Admission: RE | Admit: 2020-02-10 | Discharge: 2020-02-10 | Disposition: A | Discharge status: Home / Self Care | Payer: Medicare Other | Source: Ambulatory Visit | Attending: Surgery | Admitting: Surgery

## 2020-02-10 DIAGNOSIS — K7689 Other specified diseases of liver: Secondary | ICD-10-CM

## 2020-05-18 ENCOUNTER — Ambulatory Visit: Payer: Self-pay | Admitting: Surgery

## 2020-06-09 ENCOUNTER — Encounter (HOSPITAL_COMMUNITY): Payer: Self-pay

## 2020-06-09 ENCOUNTER — Other Ambulatory Visit (HOSPITAL_COMMUNITY)
Admission: RE | Admit: 2020-06-09 | Discharge: 2020-06-09 | Disposition: A | Discharge status: Home / Self Care | Payer: Medicare Other | Source: Ambulatory Visit | Attending: Surgery | Admitting: Surgery

## 2020-06-09 ENCOUNTER — Encounter (HOSPITAL_COMMUNITY)
Admission: RE | Admit: 2020-06-09 | Discharge: 2020-06-09 | Disposition: A | Discharge status: Home / Self Care | Payer: Medicare Other | Source: Ambulatory Visit | Attending: Surgery | Admitting: Surgery

## 2020-06-09 ENCOUNTER — Other Ambulatory Visit: Payer: Self-pay

## 2020-06-09 DIAGNOSIS — Z20822 Contact with and (suspected) exposure to covid-19: Secondary | ICD-10-CM | POA: Diagnosis not present

## 2020-06-09 DIAGNOSIS — Z01812 Encounter for preprocedural laboratory examination: Secondary | ICD-10-CM | POA: Diagnosis present

## 2020-06-09 DIAGNOSIS — Z01818 Encounter for other preprocedural examination: Secondary | ICD-10-CM | POA: Insufficient documentation

## 2020-06-09 HISTORY — DX: Tinnitus, unspecified ear: H93.19

## 2020-06-09 LAB — TYPE AND SCREEN
ABO/RH(D): O POS
Antibody Screen: NEGATIVE

## 2020-06-09 LAB — COMPREHENSIVE METABOLIC PANEL
ALT: 16 U/L (ref 0–44)
AST: 16 U/L (ref 15–41)
Albumin: 3.5 g/dL (ref 3.5–5.0)
Alkaline Phosphatase: 90 U/L (ref 38–126)
Anion gap: 9 (ref 5–15)
BUN: 11 mg/dL (ref 8–23)
CO2: 27 mmol/L (ref 22–32)
Calcium: 9 mg/dL (ref 8.9–10.3)
Chloride: 106 mmol/L (ref 98–111)
Creatinine, Ser: 0.68 mg/dL (ref 0.44–1.00)
GFR, Estimated: >60 mL/min (ref >60)
Glucose, Bld: 101 mg/dL — ABNORMAL HIGH (ref 70–99)
Potassium: 4 mmol/L (ref 3.5–5.1)
Sodium: 142 mmol/L (ref 135–145)
Total Bilirubin: 1 mg/dL (ref 0.3–1.2)
Total Protein: 7.2 g/dL (ref 6.5–8.1)

## 2020-06-09 LAB — CBC
HCT: 38.3 % (ref 36.0–46.0)
Hemoglobin: 11.9 g/dL — ABNORMAL LOW (ref 12.0–15.0)
MCH: 25.5 pg — ABNORMAL LOW (ref 26.0–34.0)
MCHC: 31.1 g/dL (ref 30.0–36.0)
MCV: 82.2 fL (ref 80.0–100.0)
Platelets: 278 10*3/uL (ref 150–400)
RBC: 4.66 MIL/uL (ref 3.87–5.11)
RDW: 16.5 % — ABNORMAL HIGH (ref 11.5–15.5)
WBC: 6.7 10*3/uL (ref 4.0–10.5)
nRBC: 0 % (ref 0.0–0.2)

## 2020-06-09 LAB — SARS CORONAVIRUS 2 (TAT 6-24 HRS): SARS Coronavirus 2: NEGATIVE

## 2020-06-09 NOTE — Progress Notes (Addendum)
[facility].  Cardiologist - saw Dr. Mayford Contreras in 2014- for chest pain. A Heart Cath was done and found to be normal.   Chest x-ray - NA  EKG - 06/09/20  Stress Test - no  ECHO - no  Cardiac Cath - 2014  Sleep Study - > than ten years ago.  Drs have sugguested that she have another one. CPAP - no  LABS-CBC, CMP, T/S  ASA-no  ERAS-no  HA1C-05/15/20 6.4 at PCP's office Fasting Blood Sugar - NA Checks Blood Sugar _0___ times a day  Jasmine Contreras denies having Diabetes, I discovered in records from Jasmine Contreras that patient has Pre- Diabetes.   Anesthesia- Jasmine Contreras reports that she has chest pain caused by her liver cyst. The pain is an aching in my mid chest, does not  radiate, no shortness of breath, N/V or diaphoresis with the pain.  Patient does not take NTG.  "The pain is an aching, full feeling."  Jasmine Contreras's doctors are aware per patient.  Jasmine Contreras at 05/15/20 appointment that she has had more palpations since her father died and other sick family members. Jasmine Contreras states in notes that she will increase dose of Metoprolol to 50 MG XL.  The medications record has that patient takes 25mg  XL.  I called Jasmine Contreras and asked her about the 50 mg, patient stated that she has never gotten a prescription for 50 mg, "I can take 2 if I need to, I have not needed to do that."  Jasmine Contreras has Vertigo, last time was a couple weeks ago. The vertigo happens when she moves suddenly and l;ast 3- 4 days; patient takes Zofran when she has an episode.  I asked anesthesia PA-C to review chart. Pt denies having sob, or fever at this time. All instructions explained to the pt, with a verbal understanding of the material. Pt agrees to go over the instructions while at home for a better understanding. Pt also instructed to self quarantine after being tested for COVID-19. The opportunity to ask questions was provided.

## 2020-06-09 NOTE — Pre-Procedure Instructions (Signed)
Jasmine Contreras  06/09/2020      Your procedure is scheduled on Friday, December10.  Report to [facility], Main Entrance or Entrance "A" at  8:00 AM                Your surgery or procedure is scheduled to begin at 10:00 AM   Call this number if you have problems the morning of surgery: [phone]  This is the number for the Pre- Surgical Desk.                For any other questions, please call [phone], Monday - Friday 8 AM - 4 PM.   Remember:  Do not eat or drink after midnight, Thursday, December 9.   Take these medicines the morning of surgery with A SIP OF WATER: loratadine (CLARITIN)      metoprolol succinate (TOPROL-XL)     pantoprazole (PROTONIX)   Take if needed: acetaminophen (TYLENOL) carboxymethylcellulose (REFRESH PLUS) eye drops colchicine  nitroGLYCERIN (NITROSTAT)    1 Week prior to surgery STOP taking Aspirin, Aspirin Products (Goody Powder, Excedrin Migraine), Ibuprofen (Advil), Naproxen (Aleve), indomethacin (INDOCIN),Vitamins and Herbal Products (ie Fish Oil).    Special instructions:    [place]- Preparing For Surgery  Before surgery, you can play an important role. Because skin is not sterile, your skin needs to be as free of germs as possible. You can reduce the number of germs on your skin by washing with CHG (chlorahexidine gluconate) Soap before surgery.  CHG is an antiseptic cleaner which kills germs and bonds with the skin to continue killing germs even after washing.    Oral Hygiene is also important to reduce your risk of infection.  Remember - BRUSH YOUR TEETH THE MORNING OF SURGERY WITH YOUR REGULAR TOOTHPASTE  Please do not use if you have an allergy to CHG or antibacterial soaps. If your skin becomes reddened/irritated stop using the CHG.  Do not shave (including legs and underarms) for at least 48 hours prior to first CHG shower. It is OK to shave your face.  Please follow these instructions  carefully.   1. Shower the NIGHT BEFORE SURGERY and the MORNING OF SURGERY with CHG.   2. If you chose to wash your hair, wash your hair first as usual with your normal shampoo.  3. After you shampoo, wash your face and private area with the soap you use at home, then rinse your hair and body thoroughly to remove the shampoo and soap.  4. Use CHG as you would any other liquid soap. You can apply CHG directly to the skin and wash gently with a scrungie or a clean washcloth.   5. Apply the CHG Soap to your body ONLY FROM THE NECK DOWN.  Do not use on open wounds or open sores. Avoid contact with your eyes, ears, mouth and genitals (private parts).   6. Wash thoroughly, paying special attention to the area where your surgery will be performed.  7. Thoroughly rinse your body with warm water from the neck down.  8. DO NOT shower/wash with your normal soap after using and rinsing off the CHG Soap.  9. Pat yourself dry with a CLEAN TOWEL.  10. Wear CLEAN PAJAMAS to bed the night before surgery, wear comfortable clothes the morning of surgery  11. Place CLEAN SHEETS on your bed the night of your first shower and DO NOT SLEEP WITH PETS.  Day of Surgery: Shower as instructed above. Do  not apply any deodorants/lotions, powders or colognes.  Please wear clean clothes to the hospital/surgery center.   Remember to brush your teeth WITH YOUR REGULAR TOOTHPASTE.  Do not wear jewelry, make-up or nail polish.  Do not shave 48 hours prior to surgery.  Men may shave face and neck.  Do not bring valuables to the hospital.  [facility] is not responsible for any belongings or valuables.  Contacts, dentures or bridgework may not be worn into surgery.  Leave your suitcase in the car.  After surgery it may be brought to your room.  For patients admitted to the hospital, discharge time will be determined by your treatment team.  Patients discharged the day of surgery will not be allowed to drive home.    Please read over the fact sheets that you were given.

## 2020-06-10 NOTE — Anesthesia Preprocedure Evaluation (Addendum)
Anesthesia Evaluation  Patient identified by MRN, date of birth, ID band Patient awake    Reviewed: Patient's Chart, lab work & pertinent test results, reviewed documented beta blocker date and time   History of Anesthesia Complications (+) PROLONGED EMERGENCE  Airway Mallampati: II  TM Distance: >3 FB Neck ROM: Full    Dental  (+) Teeth Intact   Pulmonary asthma ,    Pulmonary exam normal        Cardiovascular hypertension, Pt. on medications and Pt. on home beta blockers + DOE   Rhythm:Regular Rate:Normal     Neuro/Psych  Headaches, Anxiety Depression TIA   GI/Hepatic hiatal hernia, PUD, GERD  Medicated and Controlled,Hepatic cysts   Endo/Other  negative endocrine ROS  Renal/GU negative Renal ROS  negative genitourinary   Musculoskeletal  (+) Arthritis , gout   Abdominal (+)  Abdomen: soft. Bowel sounds: normal.  Peds  Hematology  (+) anemia ,   Anesthesia Other Findings   Reproductive/Obstetrics                            Anesthesia Physical Anesthesia Plan  ASA: III  Anesthesia Plan: General   Post-op Pain Management:    Induction: Intravenous  PONV Risk Score and Plan: 3 and Ondansetron, Dexamethasone and Treatment may vary due to age or medical condition  Airway Management Planned: Mask and Oral ETT  Additional Equipment: None  Intra-op Plan:   Post-operative Plan: Extubation in OR  Informed Consent: I have reviewed the patients History and Physical, chart, labs and discussed the procedure including the risks, benefits and alternatives for the proposed anesthesia with the patient or authorized representative who has indicated his/her understanding and acceptance.     Dental advisory given  Plan Discussed with: CRNA  Anesthesia Plan Comments: (Lab Results      Component                Value               Date                      WBC                      6.7                  06/09/2020                HGB                      11.9 (L)            06/09/2020                HCT                      38.3                06/09/2020                MCV                      82.2                06/09/2020                PLT  278                 06/09/2020           Lab Results      Component                Value               Date                      NA                       142                 06/09/2020                K                        4.0                 06/09/2020                CO2                      27                  06/09/2020                GLUCOSE                  101 (H)             06/09/2020                BUN                      11                  06/09/2020                CREATININE               0.68                06/09/2020                CALCIUM                  9.0                 06/09/2020                GFRNONAA                 >60                 06/09/2020                GFRAA                    >60                 11/09/2019           Lab Results      Component                Value               Date  ALT                      16                  06/09/2020                AST                      16                  06/09/2020                ALKPHOS                  90                  06/09/2020                BILITOT                  1.0                 06/09/2020           PAT note by Antionette Poles, PA-C: Evaluated by cardiology in 2014 for chest pain.  Ultimately underwent cath showing normal coronaries, normal LV function.  Pain was deemed noncardiac.  She also has a history of palpitations, has seen Dr. Donnie Aho about this previously.  He started her metoprolol.  More recently she discussed this with her PCP Dr. Laurann Montana on 05/15/2020.  Patient reported increased palpitations since her dad passed away.  Per note, she was having frequent PACs.  She also had occasional quick  sharp chest pain.  Dr. Cliffton Asters increase metoprolol from 25 mg to 50 mg once daily.  Recently underwent laparoscopic surgery to drain hepatic cysts on 11/08/2019 without complication.  Preop labs reviewed, unremarkable.  EKG 06/09/2020: Sinus bradycardia.  Rate 52.  Cath 08/28/2012: IMPRESSIONS:  Normal left main coronary artery. Normal left anterior descending artery and its branches. Normal left circumflex artery and its branches. Normal right coronary artery. Normal left ventricular systolic function.  LVEDP 15 mmHg.  Ejection fraction 55%.  RECOMMENDATION:   1.  D/C home after IVF and bedrest complete. 2.  Follow up with my NP in 2 weeks for groin check 3.  Continue home meds 4.  Follow up with primary MD for further workup of noncardiac chest pain.  )       Anesthesia Quick Evaluation

## 2020-06-10 NOTE — Progress Notes (Signed)
Anesthesia Chart Review:  Evaluated by cardiology in 2014 for chest pain.  Ultimately underwent cath showing normal coronaries, normal LV function.  Pain was deemed noncardiac.  She also has a history of palpitations, has seen Dr. Donnie Aho about this previously.  He started her metoprolol.  More recently she discussed this with her PCP Dr. Laurann Montana on 05/15/2020.  Patient reported increased palpitations since her dad passed away.  Per note, she was having frequent PACs.  She also had occasional quick sharp chest pain.  Dr. Cliffton Asters increase metoprolol from 25 mg to 50 mg once daily.  Recently underwent laparoscopic surgery to drain hepatic cysts on 11/08/2019 without complication.  Preop labs reviewed, unremarkable.  EKG 06/09/2020: Sinus bradycardia.  Rate 52.  Cath 08/28/2012: IMPRESSIONS:  1. Normal left main coronary artery. 2. Normal left anterior descending artery and its branches. 3. Normal left circumflex artery and its branches. 4. Normal right coronary artery. 5. Normal left ventricular systolic function.  LVEDP 15 mmHg.  Ejection fraction 55%.  RECOMMENDATION:   1.  D/C home after IVF and bedrest complete. 2.  Follow up with my NP in 2 weeks for groin check 3.  Continue home meds 4.  Follow up with primary MD for further workup of noncardiac chest pain.    Zannie Cove [facility] Short Stay Center/Anesthesiology Phone [phone] 06/10/2020 2:31 PM

## 2020-06-12 ENCOUNTER — Encounter (HOSPITAL_COMMUNITY)
Admission: RE | Disposition: A | Discharge status: Home / Self Care | Payer: Self-pay | Source: Home / Self Care | Attending: Surgery

## 2020-06-12 ENCOUNTER — Other Ambulatory Visit: Payer: Self-pay

## 2020-06-12 ENCOUNTER — Encounter (HOSPITAL_COMMUNITY): Payer: Self-pay | Admitting: Surgery

## 2020-06-12 ENCOUNTER — Observation Stay (HOSPITAL_COMMUNITY)
Admission: RE | Admit: 2020-06-12 | Discharge: 2020-06-14 | Disposition: A | Discharge status: Home / Self Care | Payer: Medicare Other | Attending: Surgery | Admitting: Surgery

## 2020-06-12 ENCOUNTER — Inpatient Hospital Stay (HOSPITAL_COMMUNITY): Payer: Medicare Other | Admitting: Physician Assistant

## 2020-06-12 ENCOUNTER — Inpatient Hospital Stay (HOSPITAL_COMMUNITY): Payer: Medicare Other | Admitting: Anesthesiology

## 2020-06-12 DIAGNOSIS — Z91040 Latex allergy status: Secondary | ICD-10-CM | POA: Diagnosis not present

## 2020-06-12 DIAGNOSIS — Z91010 Allergy to peanuts: Secondary | ICD-10-CM | POA: Diagnosis not present

## 2020-06-12 DIAGNOSIS — J45909 Unspecified asthma, uncomplicated: Secondary | ICD-10-CM | POA: Diagnosis not present

## 2020-06-12 DIAGNOSIS — K7689 Other specified diseases of liver: Secondary | ICD-10-CM | POA: Diagnosis not present

## 2020-06-12 DIAGNOSIS — I10 Essential (primary) hypertension: Secondary | ICD-10-CM | POA: Diagnosis not present

## 2020-06-12 HISTORY — PX: LAPAROSCOPIC LIVER CYST UNROOFING: SHX5901

## 2020-06-12 LAB — ABO/RH: ABO/RH(D): O POS

## 2020-06-12 SURGERY — UNROOFING, CYST, LIVER, LAPAROSCOPIC
Anesthesia: General

## 2020-06-12 MED ORDER — BUPIVACAINE HCL (PF) 0.25 % IJ SOLN
INTRAMUSCULAR | Status: AC
  Filled 2020-06-12: qty 30

## 2020-06-12 MED ORDER — ROCURONIUM BROMIDE 10 MG/ML (PF) SYRINGE
PREFILLED_SYRINGE | INTRAVENOUS | Status: AC
  Filled 2020-06-12: qty 10

## 2020-06-12 MED ORDER — LACTATED RINGERS IV SOLN: INTRAVENOUS | Status: DC

## 2020-06-12 MED ORDER — ORAL CARE MOUTH RINSE: 15.0000 mL | Freq: Once | OROMUCOSAL | Status: DC

## 2020-06-12 MED ORDER — SUCRALFATE 1 G PO TABS
1.0000 g | ORAL_TABLET | Freq: Every day | ORAL | Status: DC
  Administered 2020-06-13 – 2020-06-14 (×2): 1 g via ORAL
  Filled 2020-06-12 (×2): qty 1

## 2020-06-12 MED ORDER — ONDANSETRON 4 MG PO TBDP
4.0000 mg | ORAL_TABLET | Freq: Four times a day (QID) | ORAL | Status: DC | PRN
  Administered 2020-06-13: 10:00:00 4 mg via ORAL
  Filled 2020-06-12: qty 1

## 2020-06-12 MED ORDER — DOCUSATE SODIUM 100 MG PO CAPS
100.0000 mg | ORAL_CAPSULE | Freq: Two times a day (BID) | ORAL | Status: DC
  Administered 2020-06-12: 100 mg via ORAL
  Filled 2020-06-12 (×4): qty 1

## 2020-06-12 MED ORDER — PANTOPRAZOLE SODIUM 40 MG PO TBEC
40.0000 mg | DELAYED_RELEASE_TABLET | Freq: Two times a day (BID) | ORAL | Status: DC
  Administered 2020-06-12 – 2020-06-14 (×4): 40 mg via ORAL
  Filled 2020-06-12 (×4): qty 1

## 2020-06-12 MED ORDER — CHLORHEXIDINE GLUCONATE 0.12 % MT SOLN
OROMUCOSAL | Status: AC
  Filled 2020-06-12: qty 15

## 2020-06-12 MED ORDER — ONDANSETRON HCL 4 MG/2ML IJ SOLN
INTRAMUSCULAR | Status: DC | PRN
  Administered 2020-06-12: 4 mg via INTRAVENOUS

## 2020-06-12 MED ORDER — METHOCARBAMOL 750 MG PO TABS: 750.0000 mg | ORAL_TABLET | Freq: Four times a day (QID) | ORAL | 0 refills | Status: AC

## 2020-06-12 MED ORDER — LIDOCAINE HCL (PF) 2 % IJ SOLN
INTRAMUSCULAR | Status: AC
  Filled 2020-06-12: qty 5

## 2020-06-12 MED ORDER — METOPROLOL SUCCINATE ER 25 MG PO TB24
25.0000 mg | ORAL_TABLET | Freq: Every day | ORAL | Status: DC
  Filled 2020-06-12: qty 1

## 2020-06-12 MED ORDER — TRAMADOL HCL 50 MG PO TABS: 50.0000 mg | ORAL_TABLET | Freq: Four times a day (QID) | ORAL | Status: DC | PRN

## 2020-06-12 MED ORDER — ONDANSETRON HCL 4 MG/2ML IJ SOLN
INTRAMUSCULAR | Status: AC
  Filled 2020-06-12: qty 2

## 2020-06-12 MED ORDER — DEXAMETHASONE SODIUM PHOSPHATE 10 MG/ML IJ SOLN
INTRAMUSCULAR | Status: AC
  Filled 2020-06-12: qty 1

## 2020-06-12 MED ORDER — ONDANSETRON HCL 4 MG/2ML IJ SOLN
4.0000 mg | Freq: Four times a day (QID) | INTRAMUSCULAR | Status: DC | PRN
  Filled 2020-06-12: qty 2

## 2020-06-12 MED ORDER — CHLORHEXIDINE GLUCONATE 0.12 % MT SOLN: 15.0000 mL | Freq: Once | OROMUCOSAL | Status: DC

## 2020-06-12 MED ORDER — LABETALOL HCL 5 MG/ML IV SOLN
INTRAVENOUS | Status: AC
  Filled 2020-06-12: qty 4

## 2020-06-12 MED ORDER — METHOCARBAMOL 750 MG PO TABS
750.0000 mg | ORAL_TABLET | Freq: Four times a day (QID) | ORAL | Status: DC
  Administered 2020-06-12 – 2020-06-14 (×6): 750 mg via ORAL
  Filled 2020-06-12 (×6): qty 1

## 2020-06-12 MED ORDER — FENTANYL CITRATE (PF) 250 MCG/5ML IJ SOLN
INTRAMUSCULAR | Status: AC
  Filled 2020-06-12: qty 5

## 2020-06-12 MED ORDER — ACETAMINOPHEN 500 MG PO TABS
1000.0000 mg | ORAL_TABLET | ORAL | Status: AC
  Administered 2020-06-12: 1000 mg via ORAL
  Filled 2020-06-12: qty 2

## 2020-06-12 MED ORDER — POLYVINYL ALCOHOL 1.4 % OP SOLN
1.0000 [drp] | Freq: Every day | OPHTHALMIC | Status: DC | PRN
  Administered 2020-06-13: 09:00:00 1 [drp] via OPHTHALMIC
  Filled 2020-06-12: qty 15

## 2020-06-12 MED ORDER — LIDOCAINE HCL (CARDIAC) PF 100 MG/5ML IV SOSY
PREFILLED_SYRINGE | INTRAVENOUS | Status: DC | PRN
  Administered 2020-06-12: 80 mg via INTRAVENOUS

## 2020-06-12 MED ORDER — FENTANYL CITRATE (PF) 100 MCG/2ML IJ SOLN
12.5000 ug | INTRAMUSCULAR | Status: DC | PRN
  Administered 2020-06-12 – 2020-06-13 (×2): 12.5 ug via INTRAVENOUS
  Filled 2020-06-12 (×3): qty 2

## 2020-06-12 MED ORDER — METOPROLOL TARTRATE 5 MG/5ML IV SOLN: 5.0000 mg | INTRAVENOUS | Status: DC | PRN

## 2020-06-12 MED ORDER — LABETALOL HCL 5 MG/ML IV SOLN
INTRAVENOUS | Status: DC | PRN
  Administered 2020-06-12: 5 mg via INTRAVENOUS

## 2020-06-12 MED ORDER — SODIUM CHLORIDE 0.9 % IR SOLN
Status: DC | PRN
  Administered 2020-06-12: 1000 mL

## 2020-06-12 MED ORDER — COLCHICINE 0.6 MG PO TABS: 0.6000 mg | ORAL_TABLET | Freq: Every day | ORAL | Status: DC | PRN

## 2020-06-12 MED ORDER — ENOXAPARIN SODIUM 40 MG/0.4ML ~~LOC~~ SOLN
40.0000 mg | SUBCUTANEOUS | Status: DC
  Filled 2020-06-12 (×2): qty 0.4

## 2020-06-12 MED ORDER — FENTANYL CITRATE (PF) 100 MCG/2ML IJ SOLN
25.0000 ug | INTRAMUSCULAR | Status: DC | PRN
  Administered 2020-06-12: 50 ug via INTRAVENOUS

## 2020-06-12 MED ORDER — DEXAMETHASONE SODIUM PHOSPHATE 4 MG/ML IJ SOLN
INTRAMUSCULAR | Status: DC | PRN
  Administered 2020-06-12: 10 mg via INTRAVENOUS

## 2020-06-12 MED ORDER — FENTANYL CITRATE (PF) 100 MCG/2ML IJ SOLN
INTRAMUSCULAR | Status: DC | PRN
  Administered 2020-06-12: 25 ug via INTRAVENOUS
  Administered 2020-06-12 (×7): 50 ug via INTRAVENOUS
  Administered 2020-06-12: 25 ug via INTRAVENOUS

## 2020-06-12 MED ORDER — CEFAZOLIN SODIUM-DEXTROSE 2-4 GM/100ML-% IV SOLN
2.0000 g | INTRAVENOUS | Status: AC
  Administered 2020-06-12: 2 g via INTRAVENOUS
  Filled 2020-06-12: qty 100

## 2020-06-12 MED ORDER — FENTANYL CITRATE (PF) 100 MCG/2ML IJ SOLN
INTRAMUSCULAR | Status: AC
  Administered 2020-06-12: 50 ug via INTRAVENOUS
  Filled 2020-06-12: qty 2

## 2020-06-12 MED ORDER — PROPOFOL 10 MG/ML IV BOLUS
INTRAVENOUS | Status: DC | PRN
  Administered 2020-06-12: 150 mg via INTRAVENOUS

## 2020-06-12 MED ORDER — ACETAMINOPHEN 500 MG PO TABS
1000.0000 mg | ORAL_TABLET | Freq: Three times a day (TID) | ORAL | Status: DC
  Administered 2020-06-12 – 2020-06-14 (×5): 1000 mg via ORAL
  Filled 2020-06-12 (×5): qty 2

## 2020-06-12 MED ORDER — SUGAMMADEX SODIUM 200 MG/2ML IV SOLN
INTRAVENOUS | Status: DC | PRN
  Administered 2020-06-12: 100 mg via INTRAVENOUS
  Administered 2020-06-12: 30 mg via INTRAVENOUS
  Administered 2020-06-12 (×2): 50 mg via INTRAVENOUS

## 2020-06-12 MED ORDER — CARBOXYMETHYLCELLULOSE SODIUM 0.5 % OP SOLN: 1.0000 [drp] | Freq: Every day | OPHTHALMIC | Status: DC | PRN

## 2020-06-12 MED ORDER — DOCUSATE SODIUM 100 MG PO CAPS: 100.0000 mg | ORAL_CAPSULE | Freq: Two times a day (BID) | ORAL | 0 refills | Status: DC

## 2020-06-12 MED ORDER — PHENYLEPHRINE 40 MCG/ML (10ML) SYRINGE FOR IV PUSH (FOR BLOOD PRESSURE SUPPORT)
PREFILLED_SYRINGE | INTRAVENOUS | Status: DC | PRN
  Administered 2020-06-12: 80 ug via INTRAVENOUS

## 2020-06-12 MED ORDER — ROCURONIUM BROMIDE 10 MG/ML (PF) SYRINGE
PREFILLED_SYRINGE | INTRAVENOUS | Status: DC | PRN
  Administered 2020-06-12: 10 mg via INTRAVENOUS
  Administered 2020-06-12: 50 mg via INTRAVENOUS
  Administered 2020-06-12: 20 mg via INTRAVENOUS

## 2020-06-12 MED ORDER — ONDANSETRON HCL 4 MG/2ML IJ SOLN: 4.0000 mg | Freq: Once | INTRAMUSCULAR | Status: DC | PRN

## 2020-06-12 SURGICAL SUPPLY — 78 items
ADH SKN CLS APL DERMABOND .7 (GAUZE/BANDAGES/DRESSINGS) ×1
APL LAPSCP 35 DL APL RGD (MISCELLANEOUS)
APL PRP STRL LF DISP 70% ISPRP (MISCELLANEOUS) ×1
APPLICATOR VISTASEAL 35 (MISCELLANEOUS) IMPLANT
BAG SPEC RTRVL LRG 6X4 10 (ENDOMECHANICALS)
BLADE CLIPPER SURG (BLADE) IMPLANT
CANISTER SUCT 3000ML PPV (MISCELLANEOUS) ×2 IMPLANT
CATH KIT ON-Q SILVERSOAK 5 (CATHETERS) ×2 IMPLANT
CATH KIT ON-Q SILVERSOAK 5IN (CATHETERS) IMPLANT
CHLORAPREP W/TINT 26 (MISCELLANEOUS) ×2 IMPLANT
CLIP VESOCCLUDE LG 6/CT (CLIP) ×2 IMPLANT
CLIP VESOCCLUDE MED 6/CT (CLIP) ×2 IMPLANT
COVER SURGICAL LIGHT HANDLE (MISCELLANEOUS) ×2 IMPLANT
COVER WAND RF STERILE (DRAPES) ×2 IMPLANT
DERMABOND ADVANCED (GAUZE/BANDAGES/DRESSINGS) ×1
DERMABOND ADVANCED .7 DNX12 (GAUZE/BANDAGES/DRESSINGS) IMPLANT
DRAIN CHANNEL 19F RND (DRAIN) ×2 IMPLANT
DRAPE WARM FLUID 44X44 (DRAPES) ×1 IMPLANT
DRSG COVADERM 4X10 (GAUZE/BANDAGES/DRESSINGS) IMPLANT
DRSG COVADERM 4X14 (GAUZE/BANDAGES/DRESSINGS) IMPLANT
ELECT BLADE 6.5 EXT (BLADE) IMPLANT
ELECT CAUTERY BLADE 6.4 (BLADE) ×2 IMPLANT
ELECT REM PT RETURN 9FT ADLT (ELECTROSURGICAL) ×2
ELECTRODE REM PT RTRN 9FT ADLT (ELECTROSURGICAL) ×1 IMPLANT
EVACUATOR SILICONE 100CC (DRAIN) IMPLANT
GAUZE SPONGE 4X4 12PLY STRL (GAUZE/BANDAGES/DRESSINGS) IMPLANT
GLOVE BIO SURGEON STRL SZ 6 (GLOVE) ×2 IMPLANT
GLOVE INDICATOR 6.5 STRL GRN (GLOVE) ×2 IMPLANT
GOWN STRL REUS W/ TWL LRG LVL3 (GOWN DISPOSABLE) IMPLANT
GOWN STRL REUS W/TWL 2XL LVL3 (GOWN DISPOSABLE) ×2 IMPLANT
GOWN STRL REUS W/TWL LRG LVL3 (GOWN DISPOSABLE)
HAND PENCIL TRP OPTION (MISCELLANEOUS) IMPLANT
IV NS 1000ML (IV SOLUTION) ×4
IV NS 1000ML BAXH (IV SOLUTION) IMPLANT
KIT BASIN OR (CUSTOM PROCEDURE TRAY) ×2 IMPLANT
KIT TURNOVER KIT B (KITS) ×2 IMPLANT
L-HOOK LAP DISP 36CM (ELECTROSURGICAL) ×2
LHOOK LAP DISP 36CM (ELECTROSURGICAL) ×1 IMPLANT
NS IRRIG 1000ML POUR BTL (IV SOLUTION) ×3 IMPLANT
PAD ARMBOARD 7.5X6 YLW CONV (MISCELLANEOUS) ×4 IMPLANT
PENCIL BUTTON HOLSTER BLD 10FT (ELECTRODE) ×2 IMPLANT
POUCH SPECIMEN RETRIEVAL 10MM (ENDOMECHANICALS) IMPLANT
RELOAD STAPLE 60 2.6 WHT THN (STAPLE) IMPLANT
RELOAD STAPLER WHITE 60MM (STAPLE) IMPLANT
SCISSORS LAP 5X35 DISP (ENDOMECHANICALS) ×1 IMPLANT
SET IRRIG TUBING LAPAROSCOPIC (IRRIGATION / IRRIGATOR) IMPLANT
SET TUBE SMOKE EVAC HIGH FLOW (TUBING) ×2 IMPLANT
SHEARS HARMONIC HDI 36CM (ELECTROSURGICAL) ×1 IMPLANT
SLEEVE ENDOPATH XCEL 5M (ENDOMECHANICALS) ×5 IMPLANT
STAPLE ECHEON FLEX 60 POW ENDO (STAPLE) IMPLANT
STAPLER RELOAD WHITE 60MM (STAPLE)
STAPLER VISISTAT 35W (STAPLE) IMPLANT
SUT ETHILON 2 0 FS 18 (SUTURE) ×1 IMPLANT
SUT MNCRL AB 4-0 PS2 18 (SUTURE) ×2 IMPLANT
SUT PDS AB 1 TP1 96 (SUTURE) IMPLANT
SUT PDS II 0 TP-1 LOOPED 60 (SUTURE) IMPLANT
SUT PROLENE 3 0 SH 48 (SUTURE) ×2 IMPLANT
SUT PROLENE 4 0 RB 1 (SUTURE) ×4
SUT PROLENE 4-0 RB1 .5 CRCL 36 (SUTURE) ×2 IMPLANT
SUT VIC AB 2-0 SH 18 (SUTURE) ×1 IMPLANT
SUT VIC AB 3-0 SH 18 (SUTURE) ×2 IMPLANT
SUT VICRYL AB 2 0 TIES (SUTURE) ×2 IMPLANT
SUT VICRYL AB 3 0 TIES (SUTURE) ×1 IMPLANT
SYS LAPSCP GELPORT 120MM (MISCELLANEOUS)
SYSTEM LAPSCP GELPORT 120MM (MISCELLANEOUS) IMPLANT
TOWEL GREEN STERILE (TOWEL DISPOSABLE) ×2 IMPLANT
TOWEL GREEN STERILE FF (TOWEL DISPOSABLE) ×2 IMPLANT
TRAY FOLEY MTR SLVR 14FR STAT (SET/KITS/TRAYS/PACK) ×1 IMPLANT
TRAY LAPAROSCOPIC MC (CUSTOM PROCEDURE TRAY) ×2 IMPLANT
TROCAR XCEL 12X100 BLDLESS (ENDOMECHANICALS) ×2 IMPLANT
TROCAR XCEL BLUNT TIP 100MML (ENDOMECHANICALS) ×2 IMPLANT
TROCAR XCEL NON-BLD 5MMX100MML (ENDOMECHANICALS) IMPLANT
TUBE CONNECTING 12X1/4 (SUCTIONS) ×2 IMPLANT
TUBING EVAC SMOKE HEATED PNEUM (TUBING) IMPLANT
TUNNELER SHEATH ON-Q 16GX12 DP (PAIN MANAGEMENT) ×1 IMPLANT
WARMER LAPAROSCOPE (MISCELLANEOUS) ×2 IMPLANT
WATER STERILE IRR 1000ML POUR (IV SOLUTION) ×2 IMPLANT
YANKAUER SUCT BULB TIP NO VENT (SUCTIONS) IMPLANT

## 2020-06-12 NOTE — Discharge Instructions (Signed)
[facility] [place] [facility] DISCHARGE INSTRUCTIONS  Activity . No heavy lifting greater than 10 pounds for 4 weeks after surgery. [place] to shower, but do not bathe or submerge incisions underwater. . Do not drive while taking narcotic pain medication.  Wound Care . Your incisions are covered with skin glue called Dermabond. This will peel off on its own over time. . You may shower and allow warm soapy water to run over your incisions. Gently pat dry. . Do not submerge your incision underwater. . Monitor your incision for any new redness, tenderness, or drainage.  When to Call us: Marland Kitchen Fever greater than 100.5 . New redness, drainage, or swelling at incision site . Severe pain, nausea, or vomiting . Jaundice (yellowing of the whites of the eyes or skin)  Follow-up You have an appointment scheduled with Dr. Freida Busman on July 06, 2020 at 9:30am. This will be at the [facility] Surgery office at [address], [place], [place]. Please arrive at least 15 minutes prior to your scheduled appointment time.  For questions or concerns, please call the office at [phone].

## 2020-06-12 NOTE — Plan of Care (Addendum)
Pt admitted from PACU post-op s/p hepatic cyst removal. Pt A&Ox4. Pain being controlled w/ tylenol/robaxin and low-dose IV fentanyl for breakthrough pain. Educated on new medications and fall risk prevention strategies. Pt verbalized understanding of plan of care.   Problem: Education: Goal: Knowledge of General Education information will improve Description: Including pain rating scale, medication(s)/side effects and non-pharmacologic comfort measures Outcome: Progressing   Problem: Health Behavior/Discharge Planning: Goal: Ability to manage health-related needs will improve Outcome: Progressing   Problem: Clinical Measurements: Goal: Ability to maintain clinical measurements within normal limits will improve Outcome: Progressing Goal: Will remain free from infection Outcome: Progressing Goal: Diagnostic test results will improve Outcome: Progressing Goal: Cardiovascular complication will be avoided Outcome: Progressing   Problem: Activity: Goal: Risk for activity intolerance will decrease Outcome: Progressing   Problem: Nutrition: Goal: Adequate nutrition will be maintained Outcome: Progressing   Problem: Coping: Goal: Level of anxiety will decrease Outcome: Progressing   Problem: Elimination: Goal: Will not experience complications related to bowel motility Outcome: Progressing Goal: Will not experience complications related to urinary retention Outcome: Progressing   Problem: Pain Managment: Goal: General experience of comfort will improve Outcome: Progressing   Problem: Safety: Goal: Ability to remain free from injury will improve Outcome: Progressing   Problem: Skin Integrity: Goal: Risk for impaired skin integrity will decrease Outcome: Progressing

## 2020-06-12 NOTE — Anesthesia Procedure Notes (Signed)
Procedure Name: Intubation Date/Time: 06/12/2020 12:44 PM Performed by: Montez Morita, Loana Salvaggio W, CRNA Pre-anesthesia Checklist: Patient identified, Emergency Drugs available, Suction available and Patient being monitored Patient Re-evaluated:Patient Re-evaluated prior to induction Oxygen Delivery Method: Circle system utilized Preoxygenation: Pre-oxygenation with 100% oxygen Induction Type: IV induction Ventilation: Mask ventilation without difficulty Laryngoscope Size: Miller and 2 Grade View: Grade I Tube type: Oral Tube size: 7.0 mm Number of attempts: 1 Airway Equipment and Method: Stylet Placement Confirmation: ETT inserted through vocal cords under direct vision,  positive ETCO2 and breath sounds checked- equal and bilateral Secured at: 22 cm Tube secured with: Tape Dental Injury: Teeth and Oropharynx as per pre-operative assessment

## 2020-06-12 NOTE — Transfer of Care (Signed)
Immediate Anesthesia Transfer of Care Note  Patient: Jasmine Contreras  Procedure(s) Performed: LAPAROSCOPIC FENESTRATION OF HEPATIC CYSTS (N/A )  Patient Location: PACU  Anesthesia Type:General  Level of Consciousness: drowsy, patient cooperative and responds to stimulation  Airway & Oxygen Therapy: Patient Spontanous Breathing and Patient connected to face mask oxygen  Post-op Assessment: Report given to RN, Post -op Vital signs reviewed and stable and Patient moving all extremities X 4  Post vital signs: Reviewed and stable  Last Vitals:  Vitals Value Taken Time  BP 175/82 06/12/20 1509  Temp    Pulse 68 06/12/20 1510  Resp 11 06/12/20 1510  SpO2 95 % 06/12/20 1510  Vitals shown include unvalidated device data.  Last Pain:  Vitals:   06/12/20 0819  TempSrc:   PainSc: 5          Complications: No complications documented.

## 2020-06-12 NOTE — H&P (Signed)
Jasmine Contreras is an 69 y.o. female.   Chief Complaint: abdominal pain, liver cysts HPI: Jasmine Contreras is a 69 yo female with recurrent hepatic cysts. She has frequent burning RUQ pain radiating to her side and back. She had the cysts fenestrated in May of this year by Dr. Daphine Deutscher and felt much better after her surgery. However a few months later she again developed abdominal pain, and a CT scan showed reaccumulation of multiple cysts, the largest of which is in the dome of the liver. She presents today for fenestration of the recurrent cysts.  Past Medical History:  Diagnosis Date  . Acid reflux   . Anemia   . Anxiety    situational  . Arthritis    trochanteric bursitis  . Asthma    years ago  . Back pain   . Chest pain   . Complication of anesthesia    slow to wake up  . Cough   . Depression    situaltional  . Diarrhea   . Difficulty swallowing   . Dizziness   . Double vision   . Dry mouth   . Dysrhythmia    PAC's, PVC's  . Fatigue   . GERD (gastroesophageal reflux disease)   . Gout   . Headache    migraines ocassionally - stress related  . Heart murmur    not heard by Dr. Renold Don in 2014.  Dr. Cliffton Asters did not hear  murmer at 05/15/20 appointment.  . Hepatitis    cyst  . Herpes   . Hiatal hernia   . Hypertension   . Irritable bowel syndrome   . Itching   . Joint pain   . MVP (mitral valve prolapse)   . Neck stiffness   . Palpitations   . Pneumonia   . PUD (peptic ulcer disease)    s/p vagotomy  . Retaining fluid   . Ringing in ears   . Shortness of breath    exertion  . Stress   . Swelling of extremity   . Swelling of finger   . TIA (transient ischemic attack)   . Tinnitus   . Varicose veins   . Vertigo   . Vision changes   . Vitamin D deficiency   . Wheezing     Past Surgical History:  Procedure Laterality Date  . BREAST EXCISIONAL BIOPSY Left   . CHOLECYSTECTOMY N/A 11/08/2019   Procedure: LAPAROSCOPIC TO DRAIN HEPATIC CYSTS AND UPPER ENDOSCOPY;   Surgeon: Luretha Murphy, MD;  Location: [facility] ORS;  Service: General;  Laterality: N/A;  . COLONOSCOPY    . FOOT SURGERY Bilateral    hammer toes, in grown toemails  . GALLBLADDER SURGERY     06/09/20--- YEARS AGO- (OTHER GB SURGERY LISTED WAS ON LIVER CYST)  . LASIK    . partial gastrectomy with vagotomy    . right arthroscopic knee surgery Right   . trochanteric bursectomy Bilateral    hips  . VAGINAL HYSTERECTOMY     with oophorectomy    Family History  Problem Relation Age of Onset  . Diabetes Mother   . Hypertension Mother   . Kidney disease Mother   . Alcohol abuse Father   . Hyperlipidemia Father   . Hypertension Father   . Stroke Father   . Depression Father   . Sleep apnea Father    Social History:  reports that she has never smoked. She has never used smokeless tobacco. She reports current alcohol use. She reports previous  drug use. Drug: Marijuana.  Allergies:  Allergies  Allergen Reactions  . Honey Bee Treatment [Bee Venom] Anaphylaxis and Swelling    Body swells  . Other Other (See Comments)    Has diverticulitis- cannot have ANY FOODS WITH SEEDS!!  Marland Kitchen Shellfish-Derived Products Anaphylaxis  . Strawberry Extract Anaphylaxis  . Chlorhexidine Itching  . Peanuts [Peanut Oil] Hives  . Abilify [Aripiprazole] Other (See Comments)    Somnolence    . Adhesive [Tape] Hives and Itching  . Barium     Patient allergic to preservative in redi cat per Dr. Eppie Gibson patient should not have redi cat oral //rls  . Codeine Itching and Nausea And Vomiting  . Hydrocodone Itching and Nausea And Vomiting  . Latex Hives and Itching  . Meclizine Hcl Nausea Only  . Oxycodone Hcl Itching and Nausea And Vomiting  . Prednisone Other (See Comments) and Hypertension    "Makes my face turn red, also"  . Scopace [Scopolamine] Nausea Only  . Sertraline Other (See Comments)    Suicidal thoughts   . Tavist-D [Albertsons Dayhist-D] Other (See Comments)    Makes my nose drain  . Tramadol  Nausea Only    Medications Prior to Admission  Medication Sig Dispense Refill  . acetaminophen (TYLENOL) 325 MG tablet Take 325-650 mg by mouth every 6 (six) hours as needed (for pain or headaches).    . Ascorbic Acid (VITAMIN C) 1000 MG tablet Take 2,000 mg by mouth daily.    Marland Kitchen BIOTIN FORTE PO Take 3 mg by mouth daily.     . carboxymethylcellulose (REFRESH PLUS) 0.5 % SOLN Place 1 drop into both eyes daily as needed (Dry eye).    . Cholecalciferol (VITAMIN D3) 250 MCG (10000 UT) TABS Take 20,000 Units by mouth daily.     . colchicine 0.6 MG tablet Take 0.6 mg by mouth daily as needed (as directed for gout flares).     . EPINEPHrine 0.3 mg/0.3 mL IJ SOAJ injection Inject 0.3 mg into the muscle once as needed for anaphylaxis.     . Ferrous Sulfate (SLOW FE) 142 (45 Fe) MG TBCR Take 142 mg by mouth daily.    . indomethacin (INDOCIN) 50 MG capsule Take 50 mg by mouth 2 (two) times daily as needed (as directed for pain/gout flares). With food or milk    . loratadine (CLARITIN) 10 MG tablet Take 10 mg by mouth daily.    . metoprolol succinate (TOPROL-XL) 25 MG 24 hr tablet Take 25 mg by mouth daily.   5  . nitroGLYCERIN (NITROSTAT) 0.4 MG SL tablet Place 0.4 mg under the tongue every 5 (five) minutes as needed for chest pain.    . pantoprazole (PROTONIX) 40 MG tablet Take 30- 60 min before your first and last meals of the day (Patient taking differently: Take 40 mg by mouth 2 (two) times daily.)    . potassium chloride SA (K-DUR,KLOR-CON) 20 MEQ tablet Take 20-40 mEq by mouth See admin instructions. Tale 40 meq in the morning and 20 meq in the evening    . sucralfate (CARAFATE) 1 g tablet Take 1 g by mouth daily.     Marland Kitchen torsemide (DEMADEX) 20 MG tablet Take 30 mg by mouth daily.    . vitamin B-12 (CYANOCOBALAMIN) 100 MCG tablet Take 1,000 mcg by mouth daily.    Marland Kitchen zinc gluconate 50 MG tablet Take 50 mg by mouth daily.      Results for orders placed or performed during the hospital encounter  of  06/12/20 (from the past 48 hour(s))  ABO/Rh     Status: None   Collection Time: 06/12/20  8:15 AM  Result Value Ref Range   ABO/RH(D)      O POS Performed at [facility], [address] [place], [place] [address]    No results found.  Review of Systems  Constitutional: Negative for chills and fatigue.  Gastrointestinal: Positive for abdominal pain.  Musculoskeletal: Positive for joint swelling.    Blood pressure (!) 152/94, pulse 72, temperature 97.8 F (36.6 C), temperature source Oral, resp. rate 18, height 5\' 9"  (1.753 m), weight 115.7 kg, SpO2 98 %. Physical Exam Constitutional:      General: She is not in acute distress.    Appearance: Normal appearance.  HENT:     Head: Normocephalic and atraumatic.  Eyes:     General: No scleral icterus.    Conjunctiva/sclera: Conjunctivae normal.  Pulmonary:     Effort: Pulmonary effort is normal. No respiratory distress.  Abdominal:     General: There is no distension.     Palpations: Abdomen is soft.     Tenderness: There is no abdominal tenderness.     Comments: Multiple well-healed surgical scars.  Musculoskeletal:        General: Normal range of motion.     Cervical back: Normal range of motion.  Skin:    General: Skin is warm and dry.  Neurological:     General: No focal deficit present.     Mental Status: She is alert and oriented to person, place, and time.  Psychiatric:        Mood and Affect: Mood normal.        Behavior: Behavior normal.        Thought Content: Thought content normal.      Assessment/Plan 69 yo female with recurrent hepatic cysts. Proceed to OR for laparoscopic fenestration. Plan for post admission for overnight observation. Consent completed, all questions answered.  78, MD 06/12/2020, 9:39 AM

## 2020-06-12 NOTE — Op Note (Signed)
Date: 06/12/20  Patient: Jasmine Contreras MRN: [id number]  Preoperative Diagnosis: Recurrent hepatic cysts Postoperative Diagnosis: Same  Procedure: 1. Laparoscopic lysis of adhesions 2. Laparoscopic fenestration of hepatic cysts  Surgeon: Sophronia Simas, MD  Assistant: Luretha Murphy, MD  EBL: Minimal  Anesthesia: General  Specimens: Hepatic cyst wall  Indications: Jasmine Contreras is a 69 yo female who has multiple hepatic cysts, which were drained laparoscopically in May 2021. Her symptoms improved postop, however a few months later she developed recurrent RUQ and right flank pain. CT scan showed recurrence of a large cyst in the dome of the liver, with several smaller cysts throughout the liver. The patient agreed to undergo laparoscopic fenestration.  Findings: Large cyst in the dome of the liver (segment 7/8) correlating to patient's described symptoms, containing old hematoma consistent with prior bleeding into cyst. This cyst was fenestrated and the lining ablated. Extensive adhesions at the midline and over the left lobe of the liver.  Procedure details: Informed consent was obtained in the preoperative area prior to the procedure. The patient was brought to the operating room and placed on the table in the supine position. General anesthesia was induced and appropriate lines and drains were placed for intraoperative monitoring. Perioperative antibiotics were administered per SCIP guidelines. The abdomen was prepped and draped in the usual sterile fashion. A pre-procedure timeout was taken verifying patient identity, surgical site and procedure to be performed.  A small incision was made in the LUQ and a 26mm visiport was placed. The abdomen was insufflated but on insertion of the camera that were extensive surrounding adhesions with minimal visibility. Another 34mm visiport was placed in the right mid-abdomen. On insufflation the RLQ was free of adhesions, but there were extensive  adhesions at the midline and over the liver. Adhesiolysis was performed sharply using endoshears, clearing the midline and the right lobe of the liver. This took approximately 30 minutes. An addition 22mm port was then placed in the RUQ just to the right of midline, and 47mm ports were placed in the lower midline and right lateral costal margin. Adhesions over the dome of the liver were taken down, and a large cyst was encountered adherent to the diaphragm. This was carefully taken down using sharp dissection and harmonic shears. The diaphragm appeared in tact at the end of this dissection. The cyst was unroofed and chocolate brown fluid was drained, consistent with old blood. A smaller adjacent cyst was unroofed and drained clear fluid. The roof of each cyst was excised using harmonic shears and were sent as one specimen to pathology. The cysts were irrigated and the lining was ablated with the aquamantys. The remainder of the liver was examined. There was a small cyst under the falcificorm ligament that did not correlate with the patient's symptoms. No other obvious large or superficial cysts were identified. The bowel was inspected and there were no signs of enterotomy. The abdomen was irrigated and appeared hemostatic. The ports were removed and the port sites were closed with 4-0 monocryl subcuticular sutures. Dermabond was applied.   The patient tolerated the procedure well with no apparent complications. All counts were correct x2 at the end of the procedure. The patient was extubated and taken to PACU in stable condition.  Sophronia Simas, MD 06/12/20 3:13 PM

## 2020-06-13 DIAGNOSIS — K7689 Other specified diseases of liver: Secondary | ICD-10-CM | POA: Diagnosis not present

## 2020-06-13 LAB — COMPREHENSIVE METABOLIC PANEL
ALT: 44 U/L (ref 0–44)
AST: 76 U/L — ABNORMAL HIGH (ref 15–41)
Albumin: 3 g/dL — ABNORMAL LOW (ref 3.5–5.0)
Alkaline Phosphatase: 74 U/L (ref 38–126)
Anion gap: 11 (ref 5–15)
BUN: 13 mg/dL (ref 8–23)
CO2: 23 mmol/L (ref 22–32)
Calcium: 8.7 mg/dL — ABNORMAL LOW (ref 8.9–10.3)
Chloride: 104 mmol/L (ref 98–111)
Creatinine, Ser: 0.87 mg/dL (ref 0.44–1.00)
GFR, Estimated: >60 mL/min (ref >60)
Glucose, Bld: 162 mg/dL — ABNORMAL HIGH (ref 70–99)
Potassium: 4.1 mmol/L (ref 3.5–5.1)
Sodium: 138 mmol/L (ref 135–145)
Total Bilirubin: 1 mg/dL (ref 0.3–1.2)
Total Protein: 6.4 g/dL — ABNORMAL LOW (ref 6.5–8.1)

## 2020-06-13 LAB — CBC
HCT: 33.4 % — ABNORMAL LOW (ref 36.0–46.0)
Hemoglobin: 11.3 g/dL — ABNORMAL LOW (ref 12.0–15.0)
MCH: 26.8 pg (ref 26.0–34.0)
MCHC: 33.8 g/dL (ref 30.0–36.0)
MCV: 79.1 fL — ABNORMAL LOW (ref 80.0–100.0)
Platelets: 288 10*3/uL (ref 150–400)
RBC: 4.22 MIL/uL (ref 3.87–5.11)
RDW: 16.3 % — ABNORMAL HIGH (ref 11.5–15.5)
WBC: 8.7 10*3/uL (ref 4.0–10.5)
nRBC: 0 % (ref 0.0–0.2)

## 2020-06-13 LAB — HIV ANTIBODY (ROUTINE TESTING W REFLEX): HIV Screen 4th Generation wRfx: NONREACTIVE

## 2020-06-13 MED ORDER — TORSEMIDE 20 MG PO TABS
30.0000 mg | ORAL_TABLET | Freq: Every day | ORAL | Status: DC
  Administered 2020-06-13 – 2020-06-14 (×2): 30 mg via ORAL
  Filled 2020-06-13 (×2): qty 1

## 2020-06-13 NOTE — Op Note (Signed)
    1 Day Post-Op  Subjective: Patient reports pain, improved with tylenol and robaxin. Had some nausea overnight. Has only had liquids to drink since surgery. Voiding with good UOP. Labs unremarkable.   Objective: Vital signs in last 24 hours: Temp:  [96.8 F (36 C)-98.6 F (37 C)] 98.1 F (36.7 C) (12/11 6962) Pulse Rate:  [53-78] 73 (12/11 0614) Resp:  [12-23] 16 (12/11 9528) BP: (139-180)/(69-82) 147/80 (12/11 0614) SpO2:  [90 %-97 %] 95 % (12/11 4132)    Intake/Output from previous day: 12/10 0701 - 12/11 0700 In: 2020 [P.O.:720; I.V.:1300] Out: 600 [Urine:575; Blood:25] Intake/Output this shift: No intake/output data recorded.  PE: General: resting comfortably, NAD Neuro: alert and oriented, no focal deficits Resp: normal work of breathing Abdomen: soft, nondistended, nontender to palpation. Incisions clean and dry with no induration or drainage, mild surrounding ecchymosis. Extremities: warm and well-perfused   Lab Results:  Recent Labs    06/13/20 0403  WBC 8.7  HGB 11.3*  HCT 33.4*  PLT 288   BMET Recent Labs    06/13/20 0403  NA 138  K 4.1  CL 104  CO2 23  GLUCOSE 162*  BUN 13  CREATININE 0.87  CALCIUM 8.7*   PT/INR No results for input(s): LABPROT, INR in the last 72 hours. CMP     Component Value Date/Time   NA 138 06/13/2020 0403   NA 139 08/01/2018 1303   K 4.1 06/13/2020 0403   CL 104 06/13/2020 0403   CO2 23 06/13/2020 0403   GLUCOSE 162 (H) 06/13/2020 0403   BUN 13 06/13/2020 0403   BUN 5 (L) 08/01/2018 1303   CREATININE 0.87 06/13/2020 0403   CALCIUM 8.7 (L) 06/13/2020 0403   PROT 6.4 (L) 06/13/2020 0403   PROT 7.2 08/01/2018 1303   ALBUMIN 3.0 (L) 06/13/2020 0403   ALBUMIN 4.4 08/01/2018 1303   AST 76 (H) 06/13/2020 0403   ALT 44 06/13/2020 0403   ALKPHOS 74 06/13/2020 0403   BILITOT 1.0 06/13/2020 0403   BILITOT 0.5 08/01/2018 1303   GFRNONAA >60 06/13/2020 0403   GFRAA >60 11/09/2019 0449   Lipase     Component  Value Date/Time   LIPASE 25 11/15/2018 1312       Studies/Results: No results found.  Anti-infectives: Anti-infectives (From admission, onward)   Start     Dose/Rate Route Frequency Ordered Stop   06/12/20 0715  ceFAZolin (ANCEF) IVPB 2g/100 mL premix        2 g 200 mL/hr over 30 Minutes Intravenous On call to O.R. 06/12/20 4401 06/12/20 1242       Assessment/Plan 69 yo female with recurrent simple hepatic cysts, POD1 s/p laparoscopic fenestration and lysis of adhesions. - Regular diet - Multimodal pain control with tylenol and robaxin, patient has intolerance to most oral narcotics - Ambulate, pulmonary toilet - Home meds - Dispo: med-surg, tentative discharge tomorrow if pain control and nausea improved   LOS: 1 day    Sophronia Simas, MD [facility] Surgery General, Hepatobiliary and Pancreatic Surgery 06/13/20 8:14 AM

## 2020-06-14 DIAGNOSIS — K7689 Other specified diseases of liver: Secondary | ICD-10-CM | POA: Diagnosis not present

## 2020-06-14 NOTE — Discharge Summary (Signed)
06/14/20  Nursing Note: Patient education has been given and the patient verbalized their understanding. IV was discontinued. Patient was escorted out safely and the patient's husband picked her up. S.N/C.F

## 2020-06-14 NOTE — Discharge Summary (Signed)
Physician Discharge Summary  Patient ID: Jasmine Contreras MRN: [id number] DOB/AGE: 01/29/1951 69 y.o.  PCP: Jasmine Montana, MD  Admit date: 06/12/2020 Discharge date: 06/14/2020  Admission Diagnoses:  Recurrent painful right lobe hepatic cyst  Discharge Diagnoses:  same  Active Problems:   Hepatic cyst   Surgery:  Laparoscopic fenestration of right lobe hepatic cyst  Discharged Condition: improved  Hospital Course:   Jasmine Contreras was taken to the OR at [facility] on Friday, 12/10 and underwent laparoscopic hepatic cyst fenestration by Dr. Freida Contreras.  She has done well and is ready for discharge.   Consults: none  Significant Diagnostic Studies: path pending    Discharge Exam: Blood pressure (!) 142/71, pulse (!) 58, temperature 97.9 F (36.6 C), resp. rate 16, height 5\' 9"  (1.753 m), weight 115.7 kg, SpO2 96 %. Incisions OK  Disposition: Discharge disposition: 01-Home or Self Care       Discharge Instructions    Call MD for:  redness, tenderness, or signs of infection (pain, swelling, redness, odor or green/yellow discharge around incision site)   Complete by: As directed    Call MD for:  severe uncontrolled pain   Complete by: As directed    Diet - low sodium heart healthy   Complete by: As directed    Discharge instructions   Complete by: As directed    Shower when you get home Resume home meds Resume home diet   Increase activity slowly   Complete by: As directed      Allergies as of 06/14/2020      Reactions   Honey Bee Treatment [bee Venom] Anaphylaxis, Swelling   Body swells   Other Other (See Comments)   Has diverticulitis- cannot have ANY FOODS WITH SEEDS!!   Shellfish-derived Products Anaphylaxis   Strawberry Extract Anaphylaxis   Chlorhexidine Itching   Peanuts [peanut Oil] Hives   Abilify [aripiprazole] Other (See Comments)   Somnolence   Adhesive [tape] Hives, Itching   Barium    Patient allergic to preservative in redi cat per Dr. 14/06/2020 patient  should not have redi cat oral //rls   Codeine Itching, Nausea And Vomiting   Hydrocodone Itching, Nausea And Vomiting   Latex Hives, Itching   Meclizine Hcl Nausea Only   Oxycodone Hcl Itching, Nausea And Vomiting   Prednisone Other (See Comments), Hypertension   "Makes my face turn red, also"   Scopace [scopolamine] Nausea Only   Sertraline Other (See Comments)   Suicidal thoughts   Tavist-d [albertsons Dayhist-d] Other (See Comments)   Makes my nose drain   Tramadol Nausea Only      Medication List    TAKE these medications   acetaminophen 325 MG tablet Commonly known as: TYLENOL Take 325-650 mg by mouth every 6 (six) hours as needed (for pain or headaches).   BIOTIN FORTE PO Take 3 mg by mouth daily.   carboxymethylcellulose 0.5 % Soln Commonly known as: REFRESH PLUS Place 1 drop into both eyes daily as needed (Dry eye).   colchicine 0.6 MG tablet Take 0.6 mg by mouth daily as needed (as directed for gout flares).   docusate sodium 100 MG capsule Commonly known as: COLACE Take 1 capsule (100 mg total) by mouth 2 (two) times daily.   EPINEPHrine 0.3 mg/0.3 mL Soaj injection Commonly known as: EPI-PEN Inject 0.3 mg into the muscle once as needed for anaphylaxis.   indomethacin 50 MG capsule Commonly known as: INDOCIN Take 50 mg by mouth 2 (two) times daily as needed (  as directed for pain/gout flares). With food or milk   loratadine 10 MG tablet Commonly known as: CLARITIN Take 10 mg by mouth daily.   methocarbamol 750 MG tablet Commonly known as: ROBAXIN Take 1 tablet (750 mg total) by mouth 4 (four) times daily for 5 days.   metoprolol succinate 25 MG 24 hr tablet Commonly known as: TOPROL-XL Take 25 mg by mouth daily.   nitroGLYCERIN 0.4 MG SL tablet Commonly known as: NITROSTAT Place 0.4 mg under the tongue every 5 (five) minutes as needed for chest pain.   pantoprazole 40 MG tablet Commonly known as: Protonix Take 30- 60 min before your first and  last meals of the day What changed:   how much to take  how to take this  when to take this  additional instructions   potassium chloride SA 20 MEQ tablet Commonly known as: KLOR-CON Take 20-40 mEq by mouth See admin instructions. Tale 40 meq in the morning and 20 meq in the evening   Slow Fe 142 (45 Fe) MG Tbcr Generic drug: Ferrous Sulfate Take 142 mg by mouth daily.   sucralfate 1 g tablet Commonly known as: CARAFATE Take 1 g by mouth daily.   torsemide 20 MG tablet Commonly known as: DEMADEX Take 30 mg by mouth daily.   vitamin B-12 100 MCG tablet Commonly known as: CYANOCOBALAMIN Take 1,000 mcg by mouth daily.   vitamin C 1000 MG tablet Take 2,000 mg by mouth daily.   Vitamin D3 250 MCG (10000 UT) Tabs Take 20,000 Units by mouth daily.   zinc gluconate 50 MG tablet Take 50 mg by mouth daily.       Follow-up Information    Jasmine Mandes, MD. Schedule an appointment as soon as possible for a visit in 3 week(s).   Specialty: General Surgery Contact information: [address] [place] [place] [address] [phone]               Signed: Valarie Contreras 06/14/2020, 8:42 AM

## 2020-06-15 ENCOUNTER — Encounter (HOSPITAL_COMMUNITY): Payer: Self-pay | Admitting: Surgery

## 2020-06-15 LAB — SURGICAL PATHOLOGY

## 2020-06-15 NOTE — Anesthesia Postprocedure Evaluation (Signed)
Anesthesia Post Note  Patient: Azalyn Sliwa Zeringue  Procedure(s) Performed: LAPAROSCOPIC FENESTRATION OF HEPATIC CYSTS (N/A )     Patient location during evaluation: PACU Anesthesia Type: General Level of consciousness: awake and alert Pain management: pain level controlled Vital Signs Assessment: post-procedure vital signs reviewed and stable Respiratory status: spontaneous breathing, nonlabored ventilation, respiratory function stable and patient connected to nasal cannula oxygen Cardiovascular status: blood pressure returned to baseline and stable Postop Assessment: no apparent nausea or vomiting Anesthetic complications: no   No complications documented.  Last Vitals:  Vitals:   06/14/20 0411 06/14/20 0937  BP: (!) 142/71 125/75  Pulse: (!) 58 73  Resp: 16 16  Temp: 36.6 C 36.7 C  SpO2: 96%     Last Pain:  Vitals:   06/14/20 1050  TempSrc:   PainSc: 0-No pain                 Kennieth Rad

## 2020-09-02 ENCOUNTER — Other Ambulatory Visit: Payer: Self-pay

## 2020-09-02 ENCOUNTER — Encounter (INDEPENDENT_AMBULATORY_CARE_PROVIDER_SITE_OTHER): Payer: Self-pay | Admitting: Otolaryngology

## 2020-09-02 ENCOUNTER — Ambulatory Visit (INDEPENDENT_AMBULATORY_CARE_PROVIDER_SITE_OTHER): Payer: Medicare Other | Admitting: Otolaryngology

## 2020-09-02 VITALS — Temp 96.6°F

## 2020-09-02 DIAGNOSIS — H6123 Impacted cerumen, bilateral: Secondary | ICD-10-CM

## 2020-09-02 DIAGNOSIS — R42 Dizziness and giddiness: Secondary | ICD-10-CM | POA: Diagnosis not present

## 2020-09-02 NOTE — Progress Notes (Signed)
HPI: Jasmine Contreras is a 70 y.o. female who presents for evaluation of wax buildup in her ears.  She is always had small ear canals.  She is also had problems with dizziness for which she is seen vestibular rehab and they have performed the Epley maneuver with her.  She occasionally has vertigo when she rolls over in bed more than the left side.  But she complains of chronic problems with dizziness that she has had for years..  Past Medical History:  Diagnosis Date  . Acid reflux   . Anemia   . Anxiety    situational  . Arthritis    trochanteric bursitis  . Asthma    years ago  . Back pain   . Chest pain   . Complication of anesthesia    slow to wake up  . Cough   . Depression    situaltional  . Diarrhea   . Difficulty swallowing   . Dizziness   . Double vision   . Dry mouth   . Dysrhythmia    PAC's, PVC's  . Fatigue   . GERD (gastroesophageal reflux disease)   . Gout   . Headache    migraines ocassionally - stress related  . Heart murmur    not heard by Dr. Renold Don in 2014.  Dr. Cliffton Asters did not hear  murmer at 05/15/20 appointment.  . Hepatitis    cyst  . Herpes   . Hiatal hernia   . Hypertension   . Irritable bowel syndrome   . Itching   . Joint pain   . MVP (mitral valve prolapse)   . Neck stiffness   . Palpitations   . Pneumonia   . PUD (peptic ulcer disease)    s/p vagotomy  . Retaining fluid   . Ringing in ears   . Shortness of breath    exertion  . Stress   . Swelling of extremity   . Swelling of finger   . TIA (transient ischemic attack)   . Tinnitus   . Varicose veins   . Vertigo   . Vision changes   . Vitamin D deficiency   . Wheezing    Past Surgical History:  Procedure Laterality Date  . BREAST EXCISIONAL BIOPSY Left   . CHOLECYSTECTOMY N/A 11/08/2019   Procedure: LAPAROSCOPIC TO DRAIN HEPATIC CYSTS AND UPPER ENDOSCOPY;  Surgeon: Luretha Murphy, MD;  Location: [facility];  Service: General;  Laterality: N/A;  . COLONOSCOPY    . FOOT SURGERY  Bilateral    hammer toes, in grown toemails  . GALLBLADDER SURGERY     06/09/20--- YEARS AGO- (OTHER GB SURGERY LISTED WAS ON LIVER CYST)  . LAPAROSCOPIC LIVER CYST UNROOFING N/A 06/12/2020   Procedure: LAPAROSCOPIC FENESTRATION OF HEPATIC CYSTS;  Surgeon: Fritzi Mandes, MD;  Location: [facility] OR;  Service: General;  Laterality: N/A;  . LASIK    . partial gastrectomy with vagotomy    . right arthroscopic knee surgery Right   . trochanteric bursectomy Bilateral    hips  . VAGINAL HYSTERECTOMY     with oophorectomy   Social History   Socioeconomic History  . Marital status: Married    Spouse name: Fayrene Fearing  . Number of children: 3  . Years of education: College  . Highest education level: Not on file  Occupational History  . Occupation: Retired   Tobacco Use  . Smoking status: Never Smoker  . Smokeless tobacco: Never Used  Vaping Use  . Vaping Use: Former  Substance and Sexual Activity  . Alcohol use: Yes    Alcohol/week: 0.0 standard drinks    Comment: 1 glass of wine with dinner- ocassional  . Drug use: Not Currently    Types: Marijuana    Comment: cbd oils- 06/09/20- have not used in a while   . Sexual activity: Not Currently    Birth control/protection: Surgical  Other Topics Concern  . Not on file  Social History Narrative   Patient drinks a pepsi or glass of tea in the morning.    Social Determinants of Health   Financial Resource Strain: Not on file  Food Insecurity: Not on file  Transportation Needs: Not on file  Physical Activity: Not on file  Stress: Not on file  Social Connections: Not on file   Family History  Problem Relation Age of Onset  . Diabetes Mother   . Hypertension Mother   . Kidney disease Mother   . Alcohol abuse Father   . Hyperlipidemia Father   . Hypertension Father   . Stroke Father   . Depression Father   . Sleep apnea Father    Allergies  Allergen Reactions  . Honey Bee Treatment [Bee Venom] Anaphylaxis and Swelling    Body swells   . Other Other (See Comments)    Has diverticulitis- cannot have ANY FOODS WITH SEEDS!!  Marland Kitchen Shellfish-Derived Products Anaphylaxis  . Strawberry Extract Anaphylaxis  . Chlorhexidine Itching  . Peanuts [Peanut Oil] Hives  . Abilify [Aripiprazole] Other (See Comments)    Somnolence    . Adhesive [Tape] Hives and Itching  . Barium     Patient allergic to preservative in redi cat per Dr. Eppie Gibson patient should not have redi cat oral //rls  . Codeine Itching and Nausea And Vomiting  . Hydrocodone Itching and Nausea And Vomiting  . Latex Hives and Itching  . Meclizine Hcl Nausea Only  . Oxycodone Hcl Itching and Nausea And Vomiting  . Prednisone Other (See Comments) and Hypertension    "Makes my face turn red, also"  . Scopace [Scopolamine] Nausea Only  . Sertraline Other (See Comments)    Suicidal thoughts   . Tavist-D [Albertsons Dayhist-D] Other (See Comments)    Makes my nose drain  . Tramadol Nausea Only   Prior to Admission medications   Medication Sig Start Date End Date Taking? Authorizing Provider  acetaminophen (TYLENOL) 325 MG tablet Take 325-650 mg by mouth every 6 (six) hours as needed (for pain or headaches).    [provider]  Ascorbic Acid (VITAMIN C) 1000 MG tablet Take 2,000 mg by mouth daily.    [provider]  BIOTIN FORTE PO Take 3 mg by mouth daily.     [provider]  carboxymethylcellulose (REFRESH PLUS) 0.5 % SOLN Place 1 drop into both eyes daily as needed (Dry eye).    [provider]  Cholecalciferol (VITAMIN D3) 250 MCG (10000 UT) TABS Take 20,000 Units by mouth daily.     [provider]  colchicine 0.6 MG tablet Take 0.6 mg by mouth daily as needed (as directed for gout flares).     [provider]  docusate sodium (COLACE) 100 MG capsule Take 1 capsule (100 mg total) by mouth 2 (two) times daily. 06/12/20   Fritzi Mandes, MD  EPINEPHrine 0.3 mg/0.3 mL IJ SOAJ injection Inject 0.3 mg into the muscle  once as needed for anaphylaxis.     [provider]  Ferrous Sulfate (SLOW FE) 142 (45  Fe) MG TBCR Take 142 mg by mouth daily.    [provider]  indomethacin (INDOCIN) 50 MG capsule Take 50 mg by mouth 2 (two) times daily as needed (as directed for pain/gout flares). With food or milk    [provider]  loratadine (CLARITIN) 10 MG tablet Take 10 mg by mouth daily.    [provider]  metoprolol succinate (TOPROL-XL) 25 MG 24 hr tablet Take 25 mg by mouth daily.  09/24/15   [provider]  nitroGLYCERIN (NITROSTAT) 0.4 MG SL tablet Place 0.4 mg under the tongue every 5 (five) minutes as needed for chest pain.    [provider]  pantoprazole (PROTONIX) 40 MG tablet Take 30- 60 min before your first and last meals of the day Patient taking differently: Take 40 mg by mouth 2 (two) times daily. 01/16/18   Nyoka Cowden, MD  potassium chloride SA (K-DUR,KLOR-CON) 20 MEQ tablet Take 20-40 mEq by mouth See admin instructions. Tale 40 meq in the morning and 20 meq in the evening    [provider]  sucralfate (CARAFATE) 1 g tablet Take 1 g by mouth daily.     [provider]  torsemide (DEMADEX) 20 MG tablet Take 30 mg by mouth daily.    [provider]  vitamin B-12 (CYANOCOBALAMIN) 100 MCG tablet Take 1,000 mcg by mouth daily.    [provider]  zinc gluconate 50 MG tablet Take 50 mg by mouth daily.    [provider]     Positive ROS: Otherwise negative  All other systems have been reviewed and were otherwise negative with the exception of those mentioned in the HPI and as above.  Physical Exam: Constitutional: Alert, well-appearing, no acute distress Ears: External ears without lesions or tenderness. Ear canals are small bilaterally.  Ear canals were cleaned with forceps and curettes.  TMs were clear bilaterally after cleaning the ear canals.  On Dix-Hallpike testing she had no clinical evidence  of BPPV in the office today although she complains of some dizziness when she sat up.. Nasal: External nose without lesions. Clear nasal passages Oral: Oropharynx clear. Neck: No palpable adenopathy or masses Respiratory: Breathing comfortably  Skin: No facial/neck lesions or rash noted.  Cerumen impaction removal  Date/Time: 09/02/2020 11:42 AM Performed by: Drema Halon, MD Authorized by: Drema Halon, MD   Consent:    Consent obtained:  Verbal   Consent given by:  Patient   Risks discussed:  Pain and bleeding Procedure details:    Location:  L ear and R ear   Procedure type: curette and forceps   Post-procedure details:    Inspection:  TM intact and canal normal   Hearing quality:  Improved   Patient tolerance of procedure:  Tolerated well, no immediate complications Comments:     TMs are clear bilaterally      Assessment: Wax buildup in patient with small ear canals bilaterally. History of dizziness and BPPV.  Plan: Patient is familiar with the Epley maneuver and reviewed this with her. Ears were cleaned in the office.  She will follow-up on annual basis as her ears were last cleaned in April of last year.  Narda Bonds, MD

## 2020-09-16 ENCOUNTER — Other Ambulatory Visit: Payer: Self-pay | Admitting: Gastroenterology

## 2020-09-16 DIAGNOSIS — K7689 Other specified diseases of liver: Secondary | ICD-10-CM

## 2020-09-17 ENCOUNTER — Ambulatory Visit
Admission: RE | Admit: 2020-09-17 | Discharge: 2020-09-17 | Disposition: A | Discharge status: Home / Self Care | Payer: Medicare Other | Source: Ambulatory Visit | Attending: Gastroenterology | Admitting: Gastroenterology

## 2020-09-17 DIAGNOSIS — K7689 Other specified diseases of liver: Secondary | ICD-10-CM

## 2020-11-18 ENCOUNTER — Ambulatory Visit (INDEPENDENT_AMBULATORY_CARE_PROVIDER_SITE_OTHER): Payer: Medicare Other | Admitting: Otolaryngology

## 2020-12-25 ENCOUNTER — Other Ambulatory Visit (HOSPITAL_COMMUNITY): Payer: Self-pay | Admitting: Surgery

## 2020-12-25 DIAGNOSIS — R109 Unspecified abdominal pain: Secondary | ICD-10-CM

## 2020-12-25 DIAGNOSIS — R112 Nausea with vomiting, unspecified: Secondary | ICD-10-CM

## 2020-12-28 ENCOUNTER — Ambulatory Visit (HOSPITAL_COMMUNITY): Admission: RE | Admit: 2020-12-28 | Payer: Medicare Other | Source: Ambulatory Visit

## 2020-12-29 ENCOUNTER — Other Ambulatory Visit: Payer: Self-pay

## 2020-12-29 ENCOUNTER — Ambulatory Visit (HOSPITAL_COMMUNITY)
Admission: RE | Admit: 2020-12-29 | Discharge: 2020-12-29 | Disposition: A | Discharge status: Home / Self Care | Payer: Medicare Other | Source: Ambulatory Visit | Attending: Surgery | Admitting: Surgery

## 2020-12-29 DIAGNOSIS — R109 Unspecified abdominal pain: Secondary | ICD-10-CM | POA: Insufficient documentation

## 2020-12-29 DIAGNOSIS — R112 Nausea with vomiting, unspecified: Secondary | ICD-10-CM | POA: Diagnosis present

## 2020-12-30 ENCOUNTER — Ambulatory Visit: Payer: Self-pay | Admitting: Surgery

## 2020-12-30 NOTE — H&P (Signed)
History of Present Illness (Jasmine Contreras L. Freida Busman MD; 12/30/2020 11:20 AM) The patient is a 70 year old female who presents with abdominal pain.Ms. Jasmine Contreras is a 70 yo female with a history of benign hepatic cysts, who has previously undergone two laparoscopic fenestrations. The first was on 11/08/19 by Dr. Daphine Deutscher. More recently she had a laparoscopic fenestration by me on 06/12/20, of the large recurrent cyst in the dome of the liver. Prior to that procedure, she had been having RUQ abdominal pain. This resolved after both of her surgeries. That pain has again recurred. She is also having nausea and early satiety, and reports difficulty eating. She had a CT scan yesterday which shows a large cyst in the dome of the liver, a left lateral cyst, and numerous smaller cysts. She says the symptoms she is having are the same symptoms she had prior to her first two fenestrations.  At her last operation she had extensive adhesions at her previous upper midline incision and around the liver. She has previously had a partial gastrectomy with B2 reconstruction about 30 years ago and has known reflux. She says her symptoms feel different from her reflux symptoms.   Allergies (Charmella Little, CNA; 12/30/2020 9:12 AM) BEE VENOM   Anaphylaxis. SHELLFISH   Anaphylaxis. Strawberry (Diagnostic) *DIAGNOSTIC PRODUCTS*   Anaphylaxis. Peanuts   Hives. Abilify *ANTIPSYCHOTICS/ANTIMANIC AGENTS*   Adhesive Tape   Codeine Sulfate *ANALGESICS - OPIOID*   Latex   Meclizine *ANTIEMETICS*   oxyCODONE HCl *ANALGESICS - OPIOID*   predniSONE *CORTICOSTEROIDS*   Scopace *ANTIEMETICS*   Sertraline HCl *ANTIDEPRESSANTS*   traMADol HCl *ANALGESICS - OPIOID*   Allergies Reconciled    Medication History (Charmella Little, CNA; 12/30/2020 9:12 AM) Biotin  (Oral) Specific strength unknown - Active. D 10000  (250 MCG(10000 UT) Capsule, Oral) Active. EPINEPHrine  (0.3MG /0.3ML Soln Auto-inj, Injection) Active. Nitroglycerin  (0.4MG  Tab  Sublingual, Sublingual) Active. Floxin Otic  (0.3% Solution, Otic) Active. Pantoprazole Sodium  (40MG  Tablet DR, Oral) Active. Colchicine  (0.6MG  Tablet, Oral) Active. Indomethacin  (50MG  Capsule, Oral) Active. Torsemide  (20MG  Tablet, Oral) Active. Vitamin B12  ( Tablet ER, Oral) Active. Klor-Con M20  ( Tablet ER, Oral) Active. Metoprolol Succinate ER  (25MG  Tablet ER 24HR, Oral) Active. Vitamin D3  (25 MCG(1000 UT) Capsule, Oral) Active. Medications Reconciled     Physical Exam (Thermon Zulauf L. Zanobia Griebel MD; 12/30/2020 11:21 AM) The physical exam findings are as follows: Note:  General: resting comfortably, NAD Neuro: alert and oriented Resp: normal work of breathing on room air Abdomen: soft, nondistended, no hepatomegaly, well-healed upper midline laparotomy scar and port site scars. Extremities: warm and well-perfused    Assessment & Plan (Deante Blough L. MD; 12/30/2020 11:26 AM) HEPATIC CYST ) Story: 70 yo female with recurrent hepatic cysts after two laparoscopic fenestrations. I personally reviewed her imaging. CT scan shows a recurrent cyst in the dome of the liver, which is slightly smaller than it was prior to drainage, as well as a persistent cyst in the left lateral liver adjacent to the stomach. The majority of her liver parenchyma is spared, but there are numerous smaller cysts. She had extensive adhesive disease from prior surgeries, and this made it quite difficult at her last operation to adequately ablate the lining of the cyst at the dome of the liver. The left lateral cyst was not fenestrated at her last operation, but I do think this is contributing to her nausea and early satiety given the proximity to the stomach. Since her symptoms have  improved after drainage, I do think they are related to the cysts. I offered to fenestrate the cysts again, but I think this would be best approached via an open operation to give the best exposure to the dome of the liver and  safely lyse her adhesions. I will also fenestrate the left lateral cyst and consider resecting the left lateral segment to minimize the likelihood of recurrence. She has now recurred after 2 laparoscopic approaches so I do not think another laparoscopic approach is best. She expressed understanding and knows that this will be a longer recovery than her previous operations. She would like to proceed with surgery given the severity of her symptoms.  Sophronia Simas, MD [facility] Surgery General, Hepatobiliary and Pancreatic Surgery 12/30/20 11:29 AM

## 2021-02-12 NOTE — Progress Notes (Signed)
Surgical Instructions    Your procedure is scheduled on Friday, Augst 19th.  Report to [facility] Main Entrance "A" at 5:30 A.M., then check in with the Admitting office.  Call this number if you have problems the morning of surgery:  [phone]   If you have any questions prior to your surgery date call [phone]: Open Monday-Friday 8am-4pm    Remember:  Do not eat or drink after midnight the night before your surgery     Take these medicines the morning of surgery with A SIP OF WATER   Eye drops - if needed  Epi pen - if needed  Indomethacin (Indocin) - if needed  Xyzal - if needed  Metoprolol   Nitroglycerin - if needed  Pantoprazole (Protonix)  Prednisone  Sucralfate (Carafate)    As of today, STOP taking any Aspirin (unless otherwise instructed by your surgeon) Aleve, Naproxen, Ibuprofen, Motrin, Advil, Goody's, BC's, all herbal medications, fish oil, and all vitamins.          Do not wear jewelry, makeup, or nail polish Do not wear lotions, powders, perfumes, or deodorant. Do not shave 48 hours prior to surgery.   Do not bring valuables to the hospital.             [facility] is not responsible for any belongings or valuables.  Do NOT Smoke (Tobacco/Vaping) or drink Alcohol 24 hours prior to your procedure If you use a CPAP at night, you may bring all equipment for your overnight stay.   Contacts, glasses, dentures or bridgework may not be worn into surgery, please bring cases for these belongings   For patients admitted to the hospital, discharge time will be determined by your treatment team.   Patients discharged the day of surgery will not be allowed to drive home, and someone needs to stay with them for 24 hours.  ONLY 1 SUPPORT PERSON MAY BE PRESENT WHILE YOU ARE IN SURGERY. IF YOU ARE TO BE ADMITTED ONCE YOU ARE IN YOUR ROOM YOU WILL BE ALLOWED TWO (2) VISITORS.  Minor children may have two parents present. Special consideration for safety and  communication needs will be reviewed on a case by case basis.  Special instructions:    Oral Hygiene is also important to reduce your risk of infection.  Remember - BRUSH YOUR TEETH THE MORNING OF SURGERY WITH YOUR REGULAR TOOTHPASTE   [place]- Preparing For Surgery  Before surgery, you can play an important role. Because skin is not sterile, your skin needs to be as free of germs as possible. You can reduce the number of germs on your skin by washing with CHG (chlorahexidine gluconate) Soap before surgery.  CHG is an antiseptic cleaner which kills germs and bonds with the skin to continue killing germs even after washing.     Please do not use if you have an allergy to CHG or antibacterial soaps. If your skin becomes reddened/irritated stop using the CHG.  Do not shave (including legs and underarms) for at least 48 hours prior to first CHG shower. It is OK to shave your face.  Please follow these instructions carefully.     Shower the NIGHT BEFORE SURGERY and the MORNING OF SURGERY with CHG Soap.   If you chose to wash your hair, wash your hair first as usual with your normal shampoo. After you shampoo, rinse your hair and body thoroughly to remove the shampoo.  Then [employer] and genitals (private parts) with your normal soap  and rinse thoroughly to remove soap.  After that Use CHG Soap as you would any other liquid soap. You can apply CHG directly to the skin and wash gently with a scrungie or a clean washcloth.   Apply the CHG Soap to your body ONLY FROM THE NECK DOWN.  Do not use on open wounds or open sores. Avoid contact with your eyes, ears, mouth and genitals (private parts). Wash Face and genitals (private parts)  with your normal soap.   Wash thoroughly, paying special attention to the area where your surgery will be performed.  Thoroughly rinse your body with warm water from the neck down.  DO NOT shower/wash with your normal soap after using and rinsing off the CHG  Soap.  Pat yourself dry with a CLEAN TOWEL.  Wear CLEAN PAJAMAS to bed the night before surgery  Place CLEAN SHEETS on your bed the night before your surgery  DO NOT SLEEP WITH PETS.   Day of Surgery:  Take a shower with CHG soap. Wear Clean/Comfortable clothing the morning of surgery Do not apply any deodorants/lotions.   Remember to brush your teeth WITH YOUR REGULAR TOOTHPASTE.   Please read over the following fact sheets that you were given.

## 2021-02-15 ENCOUNTER — Other Ambulatory Visit: Payer: Self-pay

## 2021-02-15 ENCOUNTER — Encounter (HOSPITAL_COMMUNITY)
Admission: RE | Admit: 2021-02-15 | Discharge: 2021-02-15 | Disposition: A | Discharge status: Home / Self Care | Payer: Medicare Other | Source: Ambulatory Visit | Attending: Surgery | Admitting: Surgery

## 2021-02-15 ENCOUNTER — Encounter (HOSPITAL_COMMUNITY): Payer: Self-pay

## 2021-02-15 DIAGNOSIS — Z01812 Encounter for preprocedural laboratory examination: Secondary | ICD-10-CM | POA: Insufficient documentation

## 2021-02-15 DIAGNOSIS — Z20822 Contact with and (suspected) exposure to covid-19: Secondary | ICD-10-CM | POA: Diagnosis not present

## 2021-02-15 HISTORY — DX: Other specified postprocedural states: R11.2

## 2021-02-15 HISTORY — DX: Nausea with vomiting, unspecified: Z98.890

## 2021-02-15 LAB — BASIC METABOLIC PANEL
Anion gap: 11 (ref 5–15)
BUN: 12 mg/dL (ref 8–23)
CO2: 26 mmol/L (ref 22–32)
Calcium: 8.7 mg/dL — ABNORMAL LOW (ref 8.9–10.3)
Chloride: 101 mmol/L (ref 98–111)
Creatinine, Ser: 0.81 mg/dL (ref 0.44–1.00)
GFR, Estimated: >60 mL/min (ref >60)
Glucose, Bld: 131 mg/dL — ABNORMAL HIGH (ref 70–99)
Potassium: 3.1 mmol/L — ABNORMAL LOW (ref 3.5–5.1)
Sodium: 138 mmol/L (ref 135–145)

## 2021-02-15 LAB — CBC
HCT: 37.7 % (ref 36.0–46.0)
Hemoglobin: 11.9 g/dL — ABNORMAL LOW (ref 12.0–15.0)
MCH: 25.1 pg — ABNORMAL LOW (ref 26.0–34.0)
MCHC: 31.6 g/dL (ref 30.0–36.0)
MCV: 79.4 fL — ABNORMAL LOW (ref 80.0–100.0)
Platelets: 289 10*3/uL (ref 150–400)
RBC: 4.75 MIL/uL (ref 3.87–5.11)
RDW: 15.9 % — ABNORMAL HIGH (ref 11.5–15.5)
WBC: 6.2 10*3/uL (ref 4.0–10.5)
nRBC: 0 % (ref 0.0–0.2)

## 2021-02-15 LAB — PREPARE RBC (CROSSMATCH)

## 2021-02-15 LAB — SARS CORONAVIRUS 2 (TAT 6-24 HRS): SARS Coronavirus 2: NEGATIVE

## 2021-02-15 NOTE — Progress Notes (Addendum)
PCP - Dr. Laurann Montana Cardiologist - Denies  Chest x-ray - Not indicated EKG - 06/09/20 Stress Test - Yes "over 15 years ago" it was normal per patient ECHO - Yes no follow up Cardiac Cath - Yes, two times last one in the last 10 years but was clear, no stent needed.   Sleep Study - "Yes, years ago and I had a slight sleep apnea" CPAP - Not recommended  DM - No  Blood Thinner Instructions: Was taking Jodel Mayhall and Turmeric stopped it a week ago  COVID TEST- 02/15/21   Anesthesia review: Yes cardiac history  Patient denies shortness of breath, fever, cough and chest pain at PAT appointment   All instructions explained to the patient, with a verbal understanding of the material. Patient agrees to go over the instructions while at home for a better understanding. Patient also instructed to wear a mask while in public after being tested for COVID-19. The opportunity to ask questions was provided.

## 2021-02-16 NOTE — Progress Notes (Signed)
Anesthesia Chart Review:  Evaluated by cardiology in 2014 for chest pain.  Ultimately underwent cath showing normal coronaries, normal LV function.  Pain was deemed noncardiac.  She also has a history of palpitations, has seen Dr. Donnie Aho about this previously.  He started her on metoprolol.  More recently this has been followed by her PCP Dr. Laurann Montana.  Last year, patient reported increased palpitations since her dad passed away.  Dr. Cliffton Asters increased her metoprolol from 25 mg to 50 mg once daily.     History of prolonged emergence from anesthesia.  Recently underwent laparoscopic hepatic surgery 11/08/2019 and 06/12/2020 without complication.   Preop labs reviewed, mild hypokalemia potassium 3.1, otherwise unremarkable.   EKG 06/09/2020: Sinus bradycardia.  Rate 52.   Cath 08/28/2012: IMPRESSIONS:   Normal left main coronary artery. Normal left anterior descending artery and its branches. Normal left circumflex artery and its branches. Normal right coronary artery. Normal left ventricular systolic function.  LVEDP 15 mmHg.  Ejection fraction 55%.   RECOMMENDATION:   1.  D/C home after IVF and bedrest complete. 2.  Follow up with my NP in 2 weeks for groin check 3.  Continue home meds 4.  Follow up with primary MD for further workup of noncardiac chest pain.    Zannie Cove [facility] Short Stay Center/Anesthesiology Phone [phone] 02/16/2021 2:58 PM

## 2021-02-16 NOTE — Anesthesia Preprocedure Evaluation (Addendum)
Anesthesia Evaluation  Patient identified by MRN, date of birth, ID band Patient awake    Reviewed: Allergy & Precautions, NPO status , Patient's Chart, lab work & pertinent test results  History of Anesthesia Complications (+) PONV, PROLONGED EMERGENCE and history of anesthetic complications  Airway Mallampati: II  TM Distance: >3 FB Neck ROM: Full    Dental no notable dental hx.    Pulmonary asthma ,    Pulmonary exam normal breath sounds clear to auscultation       Cardiovascular hypertension, + DOE  Normal cardiovascular exam+ Valvular Problems/Murmurs MVP  Rhythm:Regular Rate:Normal     Neuro/Psych  Headaches, PSYCHIATRIC DISORDERS Anxiety Depression    GI/Hepatic PUD, GERD  Medicated,Hepatic cysts   Endo/Other    Renal/GU   negative genitourinary   Musculoskeletal  (+) Arthritis ,   Abdominal   Peds negative pediatric ROS (+)  Hematology  (+) anemia ,   Anesthesia Other Findings   Reproductive/Obstetrics negative OB ROS                           Anesthesia Physical Anesthesia Plan  ASA: 4  Anesthesia Plan: General   Post-op Pain Management: GA combined w/ Regional for post-op pain   Induction: Intravenous  PONV Risk Score and Plan: 4 or greater and Treatment may vary due to age or medical condition, Midazolam, Ondansetron and Dexamethasone  Airway Management Planned: Oral ETT  Additional Equipment: Arterial line and None  Intra-op Plan:   Post-operative Plan: Extubation in OR  Informed Consent: I have reviewed the patients History and Physical, chart, labs and discussed the procedure including the risks, benefits and alternatives for the proposed anesthesia with the patient or authorized representative who has indicated his/her understanding and acceptance.     Dental advisory given  Plan Discussed with: CRNA and Anesthesiologist  Anesthesia Plan Comments:  (Bilateral TAP blocks with Exparel for postop pain control. GETA. +arterial line given open procedure today and possible partial hepatectomy. Tanna Furry, MD    PAT note by Antionette Poles, PA-C: Evaluated by cardiology in 2014 for chest pain. Ultimately underwent cath showing normal coronaries, normal LV function. Pain was deemed noncardiac. She also has a history of palpitations, has seen Dr. Donnie Aho about this previously. He started her on metoprolol. More recently this has been followed by her PCP Dr. Laurann Montana. Last year, patient reported increased palpitations since her dad passed away. Dr. Cliffton Asters increased her metoprolol from 25 mg to 50 mg once daily.    History of prolonged emergence from anesthesia.  Recently underwent laparoscopic hepatic surgery 11/08/2019 and 06/12/2020 without complication.  Preop labs reviewed, mild hypokalemia potassium 3.1, otherwise unremarkable.  EKG 06/09/2020: Sinus bradycardia. Rate 52.  Cath 08/28/2012: IMPRESSIONS:  1. Normal left main coronary artery. 2. Normal left anterior descending artery and its branches. 3. Normal left circumflex artery and its branches. 4. Normal right coronary artery. 5. Normal left ventricular systolic function. LVEDP 15 mmHg. Ejection fraction 55%.  RECOMMENDATION: 1. D/C home after IVF and bedrest complete. 2. Follow up with my NP in 2 weeks for groin check 3. Continue home meds 4. Follow up with primary MD for further workup of noncardiac chest pain.  )      Anesthesia Quick Evaluation

## 2021-02-19 ENCOUNTER — Inpatient Hospital Stay (HOSPITAL_COMMUNITY)
Admission: RE | Admit: 2021-02-19 | Discharge: 2021-02-25 | DRG: 407 | Disposition: A | Discharge status: Home / Self Care | Payer: Medicare Other | Attending: Surgery | Admitting: Surgery

## 2021-02-19 ENCOUNTER — Inpatient Hospital Stay (HOSPITAL_COMMUNITY): Payer: Medicare Other | Admitting: Physician Assistant

## 2021-02-19 ENCOUNTER — Encounter (HOSPITAL_COMMUNITY): Payer: Self-pay | Admitting: Surgery

## 2021-02-19 ENCOUNTER — Inpatient Hospital Stay (HOSPITAL_COMMUNITY): Payer: Medicare Other | Admitting: Certified Registered Nurse Anesthetist

## 2021-02-19 ENCOUNTER — Encounter (HOSPITAL_COMMUNITY)
Admission: RE | Disposition: A | Discharge status: Home / Self Care | Payer: Self-pay | Source: Home / Self Care | Attending: Surgery

## 2021-02-19 DIAGNOSIS — E669 Obesity, unspecified: Secondary | ICD-10-CM | POA: Diagnosis not present

## 2021-02-19 DIAGNOSIS — M109 Gout, unspecified: Secondary | ICD-10-CM | POA: Diagnosis present

## 2021-02-19 DIAGNOSIS — I10 Essential (primary) hypertension: Secondary | ICD-10-CM | POA: Diagnosis present

## 2021-02-19 DIAGNOSIS — Z91030 Bee allergy status: Secondary | ICD-10-CM | POA: Diagnosis not present

## 2021-02-19 DIAGNOSIS — Z91010 Allergy to peanuts: Secondary | ICD-10-CM

## 2021-02-19 DIAGNOSIS — Z8249 Family history of ischemic heart disease and other diseases of the circulatory system: Secondary | ICD-10-CM

## 2021-02-19 DIAGNOSIS — Z885 Allergy status to narcotic agent status: Secondary | ICD-10-CM

## 2021-02-19 DIAGNOSIS — Z91040 Latex allergy status: Secondary | ICD-10-CM | POA: Diagnosis not present

## 2021-02-19 DIAGNOSIS — Z20822 Contact with and (suspected) exposure to covid-19: Secondary | ICD-10-CM | POA: Diagnosis not present

## 2021-02-19 DIAGNOSIS — Z91041 Radiographic dye allergy status: Secondary | ICD-10-CM

## 2021-02-19 DIAGNOSIS — Z8673 Personal history of transient ischemic attack (TIA), and cerebral infarction without residual deficits: Secondary | ICD-10-CM | POA: Diagnosis not present

## 2021-02-19 DIAGNOSIS — R7303 Prediabetes: Secondary | ICD-10-CM | POA: Diagnosis present

## 2021-02-19 DIAGNOSIS — K660 Peritoneal adhesions (postprocedural) (postinfection): Secondary | ICD-10-CM | POA: Diagnosis present

## 2021-02-19 DIAGNOSIS — Z7952 Long term (current) use of systemic steroids: Secondary | ICD-10-CM | POA: Diagnosis not present

## 2021-02-19 DIAGNOSIS — Z6839 Body mass index (BMI) 39.0-39.9, adult: Secondary | ICD-10-CM | POA: Diagnosis not present

## 2021-02-19 DIAGNOSIS — K7689 Other specified diseases of liver: Principal | ICD-10-CM | POA: Diagnosis present

## 2021-02-19 DIAGNOSIS — K219 Gastro-esophageal reflux disease without esophagitis: Secondary | ICD-10-CM | POA: Diagnosis present

## 2021-02-19 DIAGNOSIS — E559 Vitamin D deficiency, unspecified: Secondary | ICD-10-CM | POA: Diagnosis not present

## 2021-02-19 DIAGNOSIS — Z79899 Other long term (current) drug therapy: Secondary | ICD-10-CM | POA: Diagnosis not present

## 2021-02-19 DIAGNOSIS — F419 Anxiety disorder, unspecified: Secondary | ICD-10-CM | POA: Diagnosis present

## 2021-02-19 DIAGNOSIS — F32A Depression, unspecified: Secondary | ICD-10-CM | POA: Diagnosis not present

## 2021-02-19 DIAGNOSIS — Z91013 Allergy to seafood: Secondary | ICD-10-CM

## 2021-02-19 DIAGNOSIS — Z833 Family history of diabetes mellitus: Secondary | ICD-10-CM | POA: Diagnosis not present

## 2021-02-19 HISTORY — PX: OPEN PARTIAL HEPATECTOMY [83]: SHX5987

## 2021-02-19 LAB — PREPARE RBC (CROSSMATCH)

## 2021-02-19 SURGERY — HEPATECTOMY, PARTIAL, OPEN
Anesthesia: General | Site: Liver

## 2021-02-19 MED ORDER — MIDAZOLAM HCL 5 MG/5ML IJ SOLN
INTRAMUSCULAR | Status: DC | PRN
Start: 2021-02-19 — End: 2021-02-19
  Administered 2021-02-19 (×2): 1 mg via INTRAVENOUS

## 2021-02-19 MED ORDER — ONDANSETRON HCL 4 MG/2ML IJ SOLN
INTRAMUSCULAR | Status: AC
  Filled 2021-02-19: qty 2

## 2021-02-19 MED ORDER — FENTANYL CITRATE (PF) 250 MCG/5ML IJ SOLN
INTRAMUSCULAR | Status: AC
  Filled 2021-02-19: qty 5

## 2021-02-19 MED ORDER — ONDANSETRON HCL 4 MG/2ML IJ SOLN
INTRAMUSCULAR | Status: AC
  Administered 2021-02-19: 4 mg via INTRAVENOUS
  Filled 2021-02-19: qty 2

## 2021-02-19 MED ORDER — SUGAMMADEX SODIUM 200 MG/2ML IV SOLN
INTRAVENOUS | Status: DC | PRN
  Administered 2021-02-19: 200 mg via INTRAVENOUS

## 2021-02-19 MED ORDER — ONDANSETRON HCL 4 MG/2ML IJ SOLN: 4.0000 mg | Freq: Four times a day (QID) | INTRAMUSCULAR | Status: DC | PRN

## 2021-02-19 MED ORDER — DEXAMETHASONE SODIUM PHOSPHATE 10 MG/ML IJ SOLN
INTRAMUSCULAR | Status: AC
  Filled 2021-02-19: qty 1

## 2021-02-19 MED ORDER — HEMOSTATIC AGENTS (NO CHARGE) OPTIME
TOPICAL | Status: DC | PRN
  Administered 2021-02-19: 1 via TOPICAL

## 2021-02-19 MED ORDER — CARBOXYMETHYLCELLULOSE SODIUM 0.5 % OP SOLN: 1.0000 [drp] | Freq: Every day | OPHTHALMIC | Status: DC | PRN

## 2021-02-19 MED ORDER — LIDOCAINE 2% (20 MG/ML) 5 ML SYRINGE
INTRAMUSCULAR | Status: DC | PRN
  Administered 2021-02-19: 100 mg via INTRAVENOUS

## 2021-02-19 MED ORDER — SUCCINYLCHOLINE CHLORIDE 200 MG/10ML IV SOSY
PREFILLED_SYRINGE | INTRAVENOUS | Status: AC
  Filled 2021-02-19: qty 10

## 2021-02-19 MED ORDER — BUPIVACAINE LIPOSOME 1.3 % IJ SUSP
INTRAMUSCULAR | Status: DC | PRN
  Administered 2021-02-19: 20 mL

## 2021-02-19 MED ORDER — ALBUMIN HUMAN 5 % IV SOLN: INTRAVENOUS | Status: DC | PRN

## 2021-02-19 MED ORDER — LACTATED RINGERS IV SOLN: INTRAVENOUS | Status: DC

## 2021-02-19 MED ORDER — DEXAMETHASONE SODIUM PHOSPHATE 10 MG/ML IJ SOLN
INTRAMUSCULAR | Status: DC | PRN
  Administered 2021-02-19: 4 mg via INTRAVENOUS

## 2021-02-19 MED ORDER — CEFAZOLIN SODIUM 1 G IJ SOLR
INTRAMUSCULAR | Status: AC
  Filled 2021-02-19: qty 10

## 2021-02-19 MED ORDER — DIPHENHYDRAMINE HCL 50 MG/ML IJ SOLN: 12.5000 mg | Freq: Four times a day (QID) | INTRAMUSCULAR | Status: DC | PRN

## 2021-02-19 MED ORDER — LABETALOL HCL 5 MG/ML IV SOLN
INTRAVENOUS | Status: AC
  Filled 2021-02-19: qty 4

## 2021-02-19 MED ORDER — PROPOFOL 10 MG/ML IV BOLUS
INTRAVENOUS | Status: AC
  Filled 2021-02-19: qty 20

## 2021-02-19 MED ORDER — AMISULPRIDE (ANTIEMETIC) 5 MG/2ML IV SOLN
10.0000 mg | Freq: Once | INTRAVENOUS | Status: AC
  Administered 2021-02-19: 10 mg via INTRAVENOUS

## 2021-02-19 MED ORDER — CEFAZOLIN SODIUM-DEXTROSE 2-4 GM/100ML-% IV SOLN
INTRAVENOUS | Status: AC
  Filled 2021-02-19: qty 100

## 2021-02-19 MED ORDER — HYDRALAZINE HCL 20 MG/ML IJ SOLN
INTRAMUSCULAR | Status: DC | PRN
  Administered 2021-02-19: 5 mg via INTRAVENOUS

## 2021-02-19 MED ORDER — HYDROMORPHONE HCL 1 MG/ML IJ SOLN
INTRAMUSCULAR | Status: AC
  Administered 2021-02-19: 0.25 mg via INTRAVENOUS
  Filled 2021-02-19: qty 1

## 2021-02-19 MED ORDER — LIDOCAINE 2% (20 MG/ML) 5 ML SYRINGE
INTRAMUSCULAR | Status: AC
  Filled 2021-02-19: qty 5

## 2021-02-19 MED ORDER — ONDANSETRON 4 MG PO TBDP: 4.0000 mg | ORAL_TABLET | Freq: Four times a day (QID) | ORAL | Status: DC | PRN

## 2021-02-19 MED ORDER — ROCURONIUM BROMIDE 10 MG/ML (PF) SYRINGE
PREFILLED_SYRINGE | INTRAVENOUS | Status: AC
  Filled 2021-02-19: qty 10

## 2021-02-19 MED ORDER — ONDANSETRON HCL 4 MG/2ML IJ SOLN
INTRAMUSCULAR | Status: DC | PRN
Start: 2021-02-19 — End: 2021-02-19
  Administered 2021-02-19: 4 mg via INTRAVENOUS

## 2021-02-19 MED ORDER — ESMOLOL HCL 100 MG/10ML IV SOLN
INTRAVENOUS | Status: DC | PRN
  Administered 2021-02-19 (×2): 30 mg via INTRAVENOUS

## 2021-02-19 MED ORDER — SODIUM CHLORIDE 0.9 % IR SOLN
Status: DC | PRN
  Administered 2021-02-19: 1000 mL

## 2021-02-19 MED ORDER — LACTATED RINGERS IV SOLN: INTRAVENOUS | Status: DC | PRN

## 2021-02-19 MED ORDER — ACETAMINOPHEN 500 MG PO TABS
1000.0000 mg | ORAL_TABLET | Freq: Three times a day (TID) | ORAL | Status: DC
  Administered 2021-02-19 – 2021-02-25 (×14): 1000 mg via ORAL
  Filled 2021-02-19 (×15): qty 2

## 2021-02-19 MED ORDER — AMISULPRIDE (ANTIEMETIC) 5 MG/2ML IV SOLN
INTRAVENOUS | Status: AC
  Filled 2021-02-19: qty 4

## 2021-02-19 MED ORDER — FENTANYL CITRATE (PF) 100 MCG/2ML IJ SOLN: 25.0000 ug | INTRAMUSCULAR | Status: DC | PRN

## 2021-02-19 MED ORDER — MIDAZOLAM HCL 2 MG/2ML IJ SOLN
INTRAMUSCULAR | Status: AC
  Filled 2021-02-19: qty 2

## 2021-02-19 MED ORDER — METHOCARBAMOL 500 MG PO TABS
500.0000 mg | ORAL_TABLET | Freq: Four times a day (QID) | ORAL | Status: DC
  Administered 2021-02-19 – 2021-02-23 (×14): 500 mg via ORAL
  Filled 2021-02-19 (×15): qty 1

## 2021-02-19 MED ORDER — PHENYLEPHRINE 40 MCG/ML (10ML) SYRINGE FOR IV PUSH (FOR BLOOD PRESSURE SUPPORT)
PREFILLED_SYRINGE | INTRAVENOUS | Status: DC | PRN
  Administered 2021-02-19 (×4): 80 ug via INTRAVENOUS

## 2021-02-19 MED ORDER — NALOXONE HCL 0.4 MG/ML IJ SOLN: 0.4000 mg | INTRAMUSCULAR | Status: DC | PRN

## 2021-02-19 MED ORDER — ENOXAPARIN SODIUM 40 MG/0.4ML IJ SOSY
40.0000 mg | PREFILLED_SYRINGE | INTRAMUSCULAR | Status: DC
  Administered 2021-02-20 – 2021-02-25 (×6): 40 mg via SUBCUTANEOUS
  Filled 2021-02-19 (×6): qty 0.4

## 2021-02-19 MED ORDER — DOCUSATE SODIUM 100 MG PO CAPS
100.0000 mg | ORAL_CAPSULE | Freq: Two times a day (BID) | ORAL | Status: DC
  Administered 2021-02-19 – 2021-02-25 (×6): 100 mg via ORAL
  Filled 2021-02-19 (×11): qty 1

## 2021-02-19 MED ORDER — KETOROLAC TROMETHAMINE 15 MG/ML IJ SOLN
INTRAMUSCULAR | Status: AC
  Administered 2021-02-19: 15 mg via INTRAVENOUS
  Filled 2021-02-19: qty 1

## 2021-02-19 MED ORDER — SODIUM CHLORIDE 0.9% FLUSH: 9.0000 mL | INTRAVENOUS | Status: DC | PRN

## 2021-02-19 MED ORDER — SODIUM CHLORIDE 0.9% IV SOLUTION: Freq: Once | INTRAVENOUS | Status: DC

## 2021-02-19 MED ORDER — CHLORHEXIDINE GLUCONATE 0.12 % MT SOLN
OROMUCOSAL | Status: AC
  Filled 2021-02-19: qty 15

## 2021-02-19 MED ORDER — KETOROLAC TROMETHAMINE 15 MG/ML IJ SOLN
15.0000 mg | Freq: Three times a day (TID) | INTRAMUSCULAR | Status: DC
  Administered 2021-02-19 – 2021-02-20 (×4): 15 mg via INTRAVENOUS
  Filled 2021-02-19 (×4): qty 1

## 2021-02-19 MED ORDER — CHLORHEXIDINE GLUCONATE 0.12 % MT SOLN: 15.0000 mL | Freq: Once | OROMUCOSAL | Status: AC

## 2021-02-19 MED ORDER — HYDROMORPHONE 1 MG/ML IV SOLN
INTRAVENOUS | Status: DC
Start: 2021-02-19 — End: 2021-02-21
  Administered 2021-02-19: 0.4 mg via INTRAVENOUS
  Administered 2021-02-20: 0.2 mg via INTRAVENOUS
  Administered 2021-02-20: 1.2 mg via INTRAVENOUS
  Administered 2021-02-20: 0.8 mg via INTRAVENOUS
  Administered 2021-02-20: 1.2 mg via INTRAVENOUS
  Administered 2021-02-20 – 2021-02-21 (×3): 0.4 mg via INTRAVENOUS

## 2021-02-19 MED ORDER — ORAL CARE MOUTH RINSE
15.0000 mL | Freq: Once | OROMUCOSAL | Status: AC
  Administered 2021-02-19: 15 mL via OROMUCOSAL

## 2021-02-19 MED ORDER — HYDROMORPHONE 1 MG/ML IV SOLN
INTRAVENOUS | Status: AC
  Administered 2021-02-19: 30 mg via INTRAVENOUS
  Filled 2021-02-19: qty 30

## 2021-02-19 MED ORDER — ONDANSETRON HCL 4 MG/2ML IJ SOLN: 4.0000 mg | Freq: Once | INTRAMUSCULAR | Status: AC | PRN

## 2021-02-19 MED ORDER — HYDRALAZINE HCL 20 MG/ML IJ SOLN: 10.0000 mg | INTRAMUSCULAR | Status: DC | PRN

## 2021-02-19 MED ORDER — PROPOFOL 10 MG/ML IV BOLUS
INTRAVENOUS | Status: DC | PRN
  Administered 2021-02-19: 50 mg via INTRAVENOUS
  Administered 2021-02-19: 150 mg via INTRAVENOUS

## 2021-02-19 MED ORDER — PANTOPRAZOLE SODIUM 40 MG PO TBEC
40.0000 mg | DELAYED_RELEASE_TABLET | Freq: Two times a day (BID) | ORAL | Status: DC
  Administered 2021-02-20 – 2021-02-25 (×11): 40 mg via ORAL
  Filled 2021-02-19 (×12): qty 1

## 2021-02-19 MED ORDER — ROCURONIUM BROMIDE 10 MG/ML (PF) SYRINGE
PREFILLED_SYRINGE | INTRAVENOUS | Status: DC | PRN
Start: 2021-02-19 — End: 2021-02-19
  Administered 2021-02-19: 100 mg via INTRAVENOUS

## 2021-02-19 MED ORDER — HYDROMORPHONE HCL 1 MG/ML IJ SOLN
0.2500 mg | INTRAMUSCULAR | Status: DC | PRN
  Administered 2021-02-19: 0.5 mg via INTRAVENOUS

## 2021-02-19 MED ORDER — SUCCINYLCHOLINE CHLORIDE 200 MG/10ML IV SOSY
PREFILLED_SYRINGE | INTRAVENOUS | Status: DC | PRN
  Administered 2021-02-19: 100 mg via INTRAVENOUS

## 2021-02-19 MED ORDER — LABETALOL HCL 5 MG/ML IV SOLN
INTRAVENOUS | Status: DC | PRN
  Administered 2021-02-19: 2.5 mg via INTRAVENOUS
  Administered 2021-02-19: 5 mg via INTRAVENOUS

## 2021-02-19 MED ORDER — BUPIVACAINE HCL (PF) 0.25 % IJ SOLN
INTRAMUSCULAR | Status: DC | PRN
  Administered 2021-02-19: 40 mL via EPIDURAL

## 2021-02-19 MED ORDER — HYDROMORPHONE HCL 1 MG/ML IJ SOLN
INTRAMUSCULAR | Status: AC
  Administered 2021-02-19: 0.5 mg via INTRAVENOUS
  Filled 2021-02-19: qty 1

## 2021-02-19 MED ORDER — DIPHENHYDRAMINE HCL 12.5 MG/5ML PO ELIX: 12.5000 mg | ORAL_SOLUTION | Freq: Four times a day (QID) | ORAL | Status: DC | PRN

## 2021-02-19 MED ORDER — ESMOLOL HCL 100 MG/10ML IV SOLN
INTRAVENOUS | Status: AC
  Filled 2021-02-19: qty 10

## 2021-02-19 MED ORDER — METOPROLOL SUCCINATE ER 25 MG PO TB24
25.0000 mg | ORAL_TABLET | Freq: Every day | ORAL | Status: DC
  Administered 2021-02-19 – 2021-02-25 (×7): 25 mg via ORAL
  Filled 2021-02-19 (×6): qty 1

## 2021-02-19 MED ORDER — POLYVINYL ALCOHOL 1.4 % OP SOLN
1.0000 [drp] | Freq: Every day | OPHTHALMIC | Status: DC | PRN
  Filled 2021-02-19: qty 15

## 2021-02-19 MED ORDER — FENTANYL CITRATE (PF) 250 MCG/5ML IJ SOLN
INTRAMUSCULAR | Status: DC | PRN
  Administered 2021-02-19 (×2): 50 ug via INTRAVENOUS
  Administered 2021-02-19: 25 ug via INTRAVENOUS
  Administered 2021-02-19 (×2): 50 ug via INTRAVENOUS
  Administered 2021-02-19: 25 ug via INTRAVENOUS
  Administered 2021-02-19 (×2): 50 ug via INTRAVENOUS
  Administered 2021-02-19: 100 ug via INTRAVENOUS
  Administered 2021-02-19: 50 ug via INTRAVENOUS

## 2021-02-19 MED ORDER — CEFAZOLIN IN SODIUM CHLORIDE 3-0.9 GM/100ML-% IV SOLN
3.0000 g | INTRAVENOUS | Status: AC
  Administered 2021-02-19: 3 g via INTRAVENOUS

## 2021-02-19 MED ORDER — SODIUM CHLORIDE 0.9 % IV SOLN
12.5000 mg | Freq: Four times a day (QID) | INTRAVENOUS | Status: DC | PRN
  Administered 2021-02-19: 12.5 mg via INTRAVENOUS
  Filled 2021-02-19 (×2): qty 0.5

## 2021-02-19 MED ORDER — SCOPOLAMINE 1 MG/3DAYS TD PT72
MEDICATED_PATCH | TRANSDERMAL | Status: AC
  Administered 2021-02-19: 1.5 mg
  Filled 2021-02-19: qty 1

## 2021-02-19 MED ORDER — 0.9 % SODIUM CHLORIDE (POUR BTL) OPTIME
TOPICAL | Status: DC | PRN
  Administered 2021-02-19 (×2): 1000 mL

## 2021-02-19 MED ORDER — HYDRALAZINE HCL 20 MG/ML IJ SOLN
INTRAMUSCULAR | Status: AC
  Filled 2021-02-19: qty 1

## 2021-02-19 MED ORDER — PROMETHAZINE HCL 25 MG/ML IJ SOLN
INTRAMUSCULAR | Status: AC
  Administered 2021-02-19: 6.25 mg
  Filled 2021-02-19: qty 1

## 2021-02-19 SURGICAL SUPPLY — 65 items
ADH SKN CLS APL DERMABOND .7 (GAUZE/BANDAGES/DRESSINGS) ×1
APL PRP STRL LF DISP 70% ISPRP (MISCELLANEOUS) ×1
BIOPATCH RED 1 DISK 7.0 (GAUZE/BANDAGES/DRESSINGS) ×2 IMPLANT
BLADE CLIPPER SURG (BLADE) IMPLANT
BOOT SUTURE AID YELLOW STND (SUTURE) ×1 IMPLANT
CANISTER SUCT 3000ML PPV (MISCELLANEOUS) ×2 IMPLANT
CHLORAPREP W/TINT 26 (MISCELLANEOUS) ×2 IMPLANT
CLIP TI LARGE 6 (CLIP) ×1 IMPLANT
CLIP TI MEDIUM 24 (CLIP) ×3 IMPLANT
CLIP TI WIDE RED SMALL 24 (CLIP) ×4 IMPLANT
CLIP VESOCCLUDE MED 24/CT (CLIP) ×2 IMPLANT
CNTNR URN SCR LID CUP LEK RST (MISCELLANEOUS) IMPLANT
CONT SPEC 4OZ STRL OR WHT (MISCELLANEOUS)
COVER SURGICAL LIGHT HANDLE (MISCELLANEOUS) ×2 IMPLANT
DERMABOND ADVANCED (GAUZE/BANDAGES/DRESSINGS) ×1
DERMABOND ADVANCED .7 DNX12 (GAUZE/BANDAGES/DRESSINGS) ×2 IMPLANT
DRAIN CHANNEL 19F RND (DRAIN) ×2 IMPLANT
DRAPE INCISE IOBAN 66X45 STRL (DRAPES) ×2 IMPLANT
DRAPE LAPAROSCOPIC ABDOMINAL (DRAPES) ×2 IMPLANT
DRAPE POUCH INSTRU U-SHP 10X18 (DRAPES) IMPLANT
DRAPE WARM FLUID 44X44 (DRAPES) ×2 IMPLANT
DRSG TEGADERM 4X4.75 (GAUZE/BANDAGES/DRESSINGS) ×4 IMPLANT
ELECT BLADE 6.5 EXT (BLADE) ×2 IMPLANT
ELECT CAUTERY BLADE 6.4 (BLADE) ×2 IMPLANT
ELECT PAD DSPR THERM+ ADLT (MISCELLANEOUS) ×2 IMPLANT
ELECT REM PT RETURN 9FT ADLT (ELECTROSURGICAL) ×2
ELECTRODE REM PT RTRN 9FT ADLT (ELECTROSURGICAL) ×1 IMPLANT
EVACUATOR SILICONE 100CC (DRAIN) ×2 IMPLANT
GAUZE 4X4 16PLY ~~LOC~~+RFID DBL (SPONGE) IMPLANT
GLOVE SURG POLY MICRO LF SZ5.5 (GLOVE) ×4 IMPLANT
GLOVE SURG UNDER POLY LF SZ6 (GLOVE) ×2 IMPLANT
GOWN STRL REUS W/ TWL LRG LVL3 (GOWN DISPOSABLE) ×2 IMPLANT
GOWN STRL REUS W/TWL LRG LVL3 (GOWN DISPOSABLE) ×4
HAND PENCIL TRP OPTION (MISCELLANEOUS) ×2 IMPLANT
HANDLE SUCTION POOLE (INSTRUMENTS) ×1 IMPLANT
KIT BASIN OR (CUSTOM PROCEDURE TRAY) ×2 IMPLANT
KIT TURNOVER KIT B (KITS) ×2 IMPLANT
NS IRRIG 1000ML POUR BTL (IV SOLUTION) ×4 IMPLANT
PACK GENERAL/GYN (CUSTOM PROCEDURE TRAY) ×2 IMPLANT
PAD ARMBOARD 7.5X6 YLW CONV (MISCELLANEOUS) ×2 IMPLANT
RETRACTOR WOUND ALXS 34CM XLRG (MISCELLANEOUS) IMPLANT
RTRCTR WOUND ALEXIS 34CM XLRG (MISCELLANEOUS) ×2
SEALER BIPOLAR AQUA 6.0 (INSTRUMENTS) ×2 IMPLANT
SLEEVE SUCTION CATH 165 (SLEEVE) ×2 IMPLANT
SPONGE T-LAP 18X18 ~~LOC~~+RFID (SPONGE) ×5 IMPLANT
SUCTION POOLE HANDLE (INSTRUMENTS) ×2
SUT CHROMIC 0 BP (SUTURE) ×2 IMPLANT
SUT CHROMIC 3 0 CT 36 (SUTURE) IMPLANT
SUT ETHILON 2 0 FS 18 (SUTURE) ×2 IMPLANT
SUT MNCRL AB 4-0 PS2 18 (SUTURE) ×2 IMPLANT
SUT PDS AB 1 TP1 96 (SUTURE) ×2 IMPLANT
SUT PROLENE 2 0 SH DA (SUTURE) IMPLANT
SUT PROLENE 4 0 RB 1 (SUTURE) ×4
SUT PROLENE 4-0 RB1 .5 CRCL 36 (SUTURE) ×2 IMPLANT
SUT SILK 2 0 SH (SUTURE) IMPLANT
SUT SILK 2 0 TIES 10X30 (SUTURE) ×2 IMPLANT
SUT SILK 2 0SH CR/8 30 (SUTURE) ×2 IMPLANT
SUT SILK 3 0 TIES 10X30 (SUTURE) ×1 IMPLANT
SUT SILK 3 0SH CR/8 30 (SUTURE) ×1 IMPLANT
SUT VIC AB 3-0 SH 27 (SUTURE) ×10
SUT VIC AB 3-0 SH 27X BRD (SUTURE) IMPLANT
SYR BULB IRRIG 60ML STRL (SYRINGE) ×2 IMPLANT
TOWEL GREEN STERILE (TOWEL DISPOSABLE) ×2 IMPLANT
TOWEL GREEN STERILE FF (TOWEL DISPOSABLE) IMPLANT
TUBE CONNECTING 12X1/4 (SUCTIONS) IMPLANT

## 2021-02-19 NOTE — Anesthesia Procedure Notes (Signed)
Arterial Line Insertion Start/End8/19/2022 7:10 AM Performed by: Audie Pinto, CRNA  Patient location: Pre-op. Lidocaine 1% used for infiltration and patient sedated Left, radial was placed Catheter size: 20 G Hand hygiene performed  and maximum sterile barriers used  Allen's test indicative of satisfactory collateral circulation Attempts: 1 Procedure performed without using ultrasound guided technique. Following insertion, dressing applied and Biopatch. Post procedure assessment: unchanged  Patient tolerated the procedure well with no immediate complications. Additional procedure comments: Alcohol prep used due to CHG allergy.

## 2021-02-19 NOTE — Anesthesia Procedure Notes (Signed)
Anesthesia Regional Block: TAP block   Pre-Anesthetic Checklist: , timeout performed,  Correct Patient, Correct Site, Correct Laterality,  Correct Procedure, Correct Position, site marked,  Risks and benefits discussed,  Surgical consent,  Pre-op evaluation,  At surgeon's request and post-op pain management  Laterality: Right and Left  Prep: chloraprep       Needles:  Injection technique: Single-shot  Needle Type: Echogenic Stimulator Needle     Needle Length: 10cm  Needle Gauge: 20     Additional Needles:   Narrative:  Start time: 02/19/2021 7:00 AM End time: 02/19/2021 7:10 AM Injection made incrementally with aspirations every 5 mL.  Performed by: Personally  Anesthesiologist: Mellody Dance, MD  Additional Notes: A functioning IV was confirmed and monitors were applied.  Sterile prep and drape, hand hygiene and sterile gloves were used.  Negative aspiration and test dose prior to incremental administration of local anesthetic. The patient tolerated the procedure well.Ultrasound  guidance: relevant anatomy identified, needle position confirmed, local anesthetic spread visualized around nerve(s), vascular puncture avoided.  Image printed for medical record.

## 2021-02-19 NOTE — Op Note (Signed)
Date: 02/19/21  Patient: Jasmine Contreras MRN: [id number]  Preoperative Diagnosis: Recurrent simple hepatic cysts Postoperative Diagnosis: Same  Procedure: Exploratory laparotomy, lysis of adhesions, fenestration of hepatic cysts  Surgeon: Sophronia Simas, MD Assistant: Luretha Murphy, MD  EBL: 100 mL  Anesthesia: General endotracheal  Specimens: Hepatic cyst wall  Indications: Ms. Elgersma is a 70 yo female with benign hepatic cysts who previously underwent laparoscopic fenestration of cysts in the right live and left anterior lobe. She has developed recurrent epigastric and RUQ abdominal pain, as well as early satiety. Imaging showed reaccumulation of a large cyst in the right posterior liver, as well as a large cyst in the left lateral segment adjacent to the gastric remnant. She was noted to have extensive intraabdominal adhesions at her last laparoscopy, thus open fenestration was recommended.  Findings: Large cyst on the posterior surface of the left lateral liver abutting the stomach, drained of clear fluid. Large cysts in segments 4 and 7 were also fenestrated, with the segment 4 cyst containing dark brown fluid consistent with prior hemorrhage into the cyst. Billroth I anatomy.  Procedure details: Informed consent was obtained in the preoperative area prior to the procedure. The patient was brought to the operating room and placed on the table in the supine position. General anesthesia was induced and appropriate lines and drains were placed for intraoperative monitoring. Perioperative antibiotics were administered per SCIP guidelines. The abdomen was prepped and draped in the usual sterile fashion. A pre-procedure timeout was taken verifying patient identity, surgical site and procedure to be performed.  An upper midline skin incision was made through the previous scar and the subcutaneous tissue was divided with cautery. The fascia was elevated and opened at the linea alba along the  length of the incision. The preperitoneal fat was opened at the inferior aspect of the incision, and the peritoneum was entered. There were adhesions to the abdominal wall at the remainder of the incision, which were taken down using gentle blunt dissection and cautery. The left lobe of the liver was then visualized and was adherent to the abdominal wall and diaphragm. These adhesions were taken down using a combination of blunt and sharp dissection to mobilize the left lobe. The duodenum and colon were adherent to the gallbladder fossa, but these were thin filmy adhesions that were easily taken down with sharp dissection. The stomach was adherent to the left lateral liver, and these adhesions were taken down sharply. In segment 2 there was a large cyst that was adherent to the proximal stomach.  These adhesions were sharply divided. Once the left lateral segment had been mobilized off the stomach, the foregut anatomy was assessed and it was clear that the proximal duodenum was anastomosed to the remnant stomach in a Billroth I fashion. Adhesions between the right lobe of the liver and abdominal wall and diaphragm were then taken down sharply. Intraoperative ultrasound was performed and confirmed a cluster of cysts in the posterior right liver, and a large cyst in segment 2 adjacent to the stomach, as well as the superficial cyst in segment 4B. The segment 2 cyst was approached first from the under-surface of the left lateral liver. The cyst was unroofed with cautery and clear fluid was drained. The lining was ablated with Aquamantys. Next the segment 4 cyst was unroofed with cautery, and this cyst drained chocolate brown fluid consistent with old blood. No bilious fluid was identified. The lining was ablated with Aquamantys. A smaller cluster of superficial cysts on the  anterior left lateral segment was similarly unroofed and the lining ablated. Next the cysts in the right posterior liver were unroofed with  cautery. There were 3 cysts in total in this location, 2 of which contained clear colorless fluid. The 3rd cyst contained old blood products. The lining of each cyst was thoroughly ablated with aquamantys. Ultrasound was again performed and confirmed resolution of the previously visualized cysts. There was an additional smaller cyst in the dome of the liver that appeared to be in segment 8, but the cyst was deeper within the parenchyma. It was abutting the right hepatic vein and as it was likely not significantly contributing to the patient's symptoms no attempts were made to drain this cyst. The abdomen was irrigated with warmed saline and hemostasis was achieved. There was only a very minimal amount of remaining omentum, which could not be mobilized to place within the cyst cavities. The fascia was closed with a running looped 1 PDS. Scarpa's fascia and the deep dermis were both closed with a running 3-0 Vicryl suture. The previous scar was sharply excised and the skin was closed with a running 4-0 monocryl suture.  The patient tolerated the procedure with no apparent complications. All counts were correct x2 at the end of the procedure. The patient was extubated and taken to PACU in stable condition.  Sophronia Simas, MD 02/19/21 10:53 AM

## 2021-02-19 NOTE — Plan of Care (Signed)

## 2021-02-19 NOTE — H&P (Signed)
Jasmine Contreras is an 70 y.o. female.   Chief Complaint: abdominal pain HPI: Jasmine Contreras is a 7 female with a history of benign symptomatic hepatic cysts who previously underwent 2 laparoscopic fenestrations, in May 2021 and December 2021. After her previous fenestrations she had improvement in her symptoms, with recurrence after a few months. Her most recent imaging shows recurrence of the largest cyst in the right hepatic lobe. She has continued to have severe RUQ and right flank pain, as well as pressure in her upper abdomen and early satiety. She presents today for surgical drainage of the cysts.  Past Medical History:  Diagnosis Date   Acid reflux    Anemia    Anxiety    situational   Arthritis    trochanteric bursitis   Asthma    years ago   Back pain    Chest pain    Complication of anesthesia    slow to wake up   Cough    Depression    situaltional   Diarrhea    Difficulty swallowing    Dizziness    Double vision    Dry mouth    Dysrhythmia    PAC's, PVC's   Fatigue    GERD (gastroesophageal reflux disease)    Gout    Headache    migraines ocassionally - stress related   Heart murmur    not heard by Dr. Renold Don in 2014.  Dr. Cliffton Asters did not hear  murmer at 05/15/20 appointment.   Hepatitis    cyst   Herpes    Hiatal hernia    Hypertension    Irritable bowel syndrome    Itching    Joint pain    MVP (mitral valve prolapse)    Neck stiffness    Palpitations    Pneumonia    PONV (postoperative nausea and vomiting)    PUD (peptic ulcer disease)    s/p vagotomy   Retaining fluid    Ringing in ears    Shortness of breath    exertion   Stress    Swelling of extremity    Swelling of finger    TIA (transient ischemic attack)    Tinnitus    Varicose veins    Vertigo    Vision changes    Vitamin D deficiency    Wheezing     Past Surgical History:  Procedure Laterality Date   APPENDECTOMY     BREAST EXCISIONAL BIOPSY Left    CARDIAC CATHETERIZATION      Twice, was found normal, no stents needed   CHOLECYSTECTOMY N/A 11/08/2019   Procedure: LAPAROSCOPIC TO DRAIN HEPATIC CYSTS AND UPPER ENDOSCOPY;  Surgeon: Luretha Murphy, MD;  Location: [facility];  Service: General;  Laterality: N/A;   COLONOSCOPY     EYE SURGERY Bilateral    Lasik surgery   FOOT SURGERY Bilateral    hammer toes, in grown toemails   GALLBLADDER SURGERY     06/09/20--- YEARS AGO- (OTHER GB SURGERY LISTED WAS ON LIVER CYST)   LAPAROSCOPIC LIVER CYST UNROOFING N/A 06/12/2020   Procedure: LAPAROSCOPIC FENESTRATION OF HEPATIC CYSTS;  Surgeon: Fritzi Mandes, MD;  Location: [facility];  Service: General;  Laterality: N/A;   LASIK     partial gastrectomy with vagotomy     right arthroscopic knee surgery Right    trochanteric bursectomy Bilateral    hips   VAGINAL HYSTERECTOMY     with oophorectomy    Family History  Problem Relation Age of  Onset   Diabetes Mother    Hypertension Mother    Kidney disease Mother    Alcohol abuse Father    Hyperlipidemia Father    Hypertension Father    Stroke Father    Depression Father    Sleep apnea Father    Social History:  reports that she has never smoked. She has never used smokeless tobacco. She reports current alcohol use. She reports that she does not currently use drugs.  Allergies:  Allergies  Allergen Reactions   Contrast Media [Iodinated Diagnostic Agents] Itching   Honey Bee Treatment [Bee Venom] Anaphylaxis and Swelling    Body swells   Other Other (See Comments)    Has diverticulitis- cannot have ANY FOODS WITH SEEDS!! Tomato   Peanuts [Peanut Oil] Hives    peanuts   Shellfish-Derived Products Anaphylaxis   Strawberry Extract Anaphylaxis   Chlorhexidine Itching   Abilify [Aripiprazole] Other (See Comments)    Drowsiness     Adhesive [Tape] Hives and Itching   Barium     Patient allergic to preservative in redi cat per Dr. Eppie Gibson patient should not have redi cat oral //rls   Codeine Itching and Nausea And  Vomiting   Hydrocodone Itching and Nausea And Vomiting   Latex Hives and Itching   Meclizine Hcl Nausea Only   Oxycodone Hcl Itching and Nausea And Vomiting   Prednisone Other (See Comments) and Hypertension    "Makes my face turn red, also"   Scopace [Scopolamine] Nausea Only   Sertraline Other (See Comments)    Suicidal thoughts    Tavist-D [[employer] Dayhist-D] Other (See Comments)    Makes my nose drain   Tramadol Nausea Only    Medications Prior to Admission  Medication Sig Dispense Refill   acetaminophen (TYLENOL) 325 MG tablet Take 325-650 mg by mouth every 6 (six) hours as needed (for pain or headaches).     Ascorbic Acid (VITAMIN C) 1000 MG tablet Take 2,000 mg by mouth daily.     Biotin 5 MG TABS Take 5 mg by mouth daily.     carboxymethylcellulose (REFRESH PLUS) 0.5 % SOLN Place 1 drop into both eyes daily as needed (Dry eye).     Cholecalciferol (VITAMIN D3) 50 MCG (2000 UT) TABS Take 2,000 Units by mouth daily.     colchicine 0.6 MG tablet Take 0.6 mg by mouth daily as needed (as directed for gout flares).      Cyanocobalamin (VITAMIN B-12 PO) Take 2,500 mcg by mouth daily.     ferrous gluconate (FERGON) 240 (27 FE) MG tablet Take 240 mg by mouth daily.     indomethacin (INDOCIN) 50 MG capsule Take 50 mg by mouth 2 (two) times daily as needed (as directed for pain/gout flares). With food or milk     levocetirizine (XYZAL) 5 MG tablet Take 5 mg by mouth daily as needed for allergies.     metoprolol succinate (TOPROL-XL) 25 MG 24 hr tablet Take 25 mg by mouth daily.   5   OVER THE COUNTER MEDICATION Take 400 mg by mouth daily. DDR prime oil     pantoprazole (PROTONIX) 40 MG tablet Take 30- 60 min before your first and last meals of the day (Patient taking differently: Take 40 mg by mouth 2 (two) times daily.)     potassium chloride SA (K-DUR,KLOR-CON) 20 MEQ tablet Take 40 mEq by mouth 2 (two) times daily.     predniSONE (DELTASONE) 20 MG tablet Take 20 mg by mouth  daily.      sucralfate (CARAFATE) 1 g tablet Take 1 g by mouth 2 (two) times daily.     torsemide (DEMADEX) 20 MG tablet Take 30 mg by mouth daily.     TURMERIC PO Take 300 mg by mouth daily.     docusate sodium (COLACE) 100 MG capsule Take 1 capsule (100 mg total) by mouth 2 (two) times daily. (Patient not taking: Reported on 02/11/2021) 10 capsule 0   EPINEPHrine 0.3 mg/0.3 mL IJ SOAJ injection Inject 0.3 mg into the muscle once as needed for anaphylaxis.      nitroGLYCERIN (NITROSTAT) 0.4 MG SL tablet Place 0.4 mg under the tongue every 5 (five) minutes as needed for chest pain.     OVER THE COUNTER MEDICATION Apply 1 application topically daily as needed (rub on stomach and feet for arthritis and nausea). Peppermint oil frankincense oil lavender oil Ginger oil      No results found for this or any previous visit (from the past 48 hour(s)). No results found.  Review of Systems  Blood pressure (!) 154/87, pulse (!) 57, temperature 97.6 F (36.4 C), temperature source Oral, resp. rate 11, height 5\' 9"  (1.753 m), weight 120.7 kg, SpO2 100 %. Physical Exam Vitals reviewed.  Constitutional:      General: She is not in acute distress.    Appearance: Normal appearance.  HENT:     Head: Normocephalic and atraumatic.  Eyes:     General: No scleral icterus.    Conjunctiva/sclera: Conjunctivae normal.  Cardiovascular:     Rate and Rhythm: Normal rate and regular rhythm.  Pulmonary:     Effort: Pulmonary effort is normal. No respiratory distress.  Abdominal:     General: There is no distension.     Palpations: Abdomen is soft.     Tenderness: There is no abdominal tenderness.     Comments: Well-healed midline laparotomy scar and port site scars.  Musculoskeletal:        General: Normal range of motion.     Cervical back: Normal range of motion.  Skin:    General: Skin is warm and dry.     Coloration: Skin is not jaundiced.  Neurological:     General: No focal deficit present.     Mental  Status: She is alert and oriented to person, place, and time.  Psychiatric:        Mood and Affect: Mood normal.        Behavior: Behavior normal.        Thought Content: Thought content normal.     Assessment/Plan 70 yo female with recurrent simple hepatic cysts with worsening symptoms. She previously underwent laparoscopic fenestration of the largest cyst in the right liver but now has symptoms of epigastric pressure and early satiety, which are most likely from the cyst in the left liver causing mass effect on the gastric remnant. She had extensive adhesions in this area at her last operation, thus I recommended an open approach for this surgery. I also discussed the possibility of resecting the part of her left lateral segment that has been replaced by cysts. Will also fenestrate the large cyst in the right liver. She agrees to proceed with surgery. Informed consent obtained. Proceed to OR, admit to inpatient postoperatively.  66, MD 02/19/2021, 7:26 AM

## 2021-02-19 NOTE — Anesthesia Postprocedure Evaluation (Signed)
Anesthesia Post Note  Patient: Jasmine Contreras  Procedure(s) Performed: OPEN FENESTRATION OF HEPATIC CYST WITH INTRAOPERATIVE ULTRASOUND (Liver)     Patient location during evaluation: PACU Anesthesia Type: General and Regional Level of consciousness: awake and alert Pain management: pain level controlled Vital Signs Assessment: post-procedure vital signs reviewed and stable Respiratory status: spontaneous breathing, nonlabored ventilation, respiratory function stable and patient connected to nasal cannula oxygen Cardiovascular status: blood pressure returned to baseline and stable Postop Assessment: no apparent nausea or vomiting Anesthetic complications: no   No notable events documented.  Last Vitals:  Vitals:   02/19/21 1155 02/19/21 1210  BP: 126/63 140/71  Pulse: 63 62  Resp: 14 14  Temp:    SpO2: 96% 95%    Last Pain:  Vitals:   02/19/21 1225  TempSrc:   PainSc: Asleep                 Mellody Dance

## 2021-02-19 NOTE — Progress Notes (Signed)
Patient admitted to 4NP10 from PACU.  Upon assessing patient and attempting to do a skin assessment, patient refusing to turn.  Educated on importance of [place] checking her skin on admission and on moving to prevent bed sores.  Patient continues to refuse and states, "If I get a bed sore, I'll let you know."

## 2021-02-19 NOTE — Anesthesia Procedure Notes (Signed)
Procedure Name: Intubation Date/Time: 02/19/2021 7:49 AM Performed by: Janene Harvey, CRNA Pre-anesthesia Checklist: Patient identified, Emergency Drugs available, Suction available and Patient being monitored Patient Re-evaluated:Patient Re-evaluated prior to induction Oxygen Delivery Method: Circle system utilized Preoxygenation: Pre-oxygenation with 100% oxygen Induction Type: IV induction and Rapid sequence Laryngoscope Size: Mac and 4 Grade View: Grade I Tube type: Oral Tube size: 7.0 mm Number of attempts: 1 Airway Equipment and Method: Stylet and Oral airway Placement Confirmation: ETT inserted through vocal cords under direct vision, positive ETCO2 and breath sounds checked- equal and bilateral Secured at: 21 cm Tube secured with: Tape Dental Injury: Teeth and Oropharynx as per pre-operative assessment

## 2021-02-19 NOTE — Transfer of Care (Signed)
Immediate Anesthesia Transfer of Care Note  Patient: Jasmine Contreras  Procedure(s) Performed: OPEN FENESTRATION OF HEPATIC CYST WITH INTRAOPERATIVE ULTRASOUND (Liver)  Patient Location: PACU  Anesthesia Type:General  Level of Consciousness: patient cooperative and responds to stimulation  Airway & Oxygen Therapy: Patient Spontanous Breathing and Patient connected to face mask oxygen  Post-op Assessment: Report given to RN and Post -op Vital signs reviewed and stable  Post vital signs: Reviewed and stable  Last Vitals:  Vitals Value Taken Time  BP 142/68 02/19/21 1056  Temp    Pulse 63 02/19/21 1057  Resp 15 02/19/21 1057  SpO2 97 % 02/19/21 1057  Vitals shown include unvalidated device data.  Last Pain:  Vitals:   02/19/21 0633  TempSrc:   PainSc: 0-No pain         Complications: No notable events documented.

## 2021-02-20 ENCOUNTER — Other Ambulatory Visit: Payer: Self-pay

## 2021-02-20 ENCOUNTER — Encounter (HOSPITAL_COMMUNITY): Payer: Self-pay | Admitting: Surgery

## 2021-02-20 LAB — CBC
HCT: 31.9 % — ABNORMAL LOW (ref 36.0–46.0)
Hemoglobin: 10.1 g/dL — ABNORMAL LOW (ref 12.0–15.0)
MCH: 25 pg — ABNORMAL LOW (ref 26.0–34.0)
MCHC: 31.7 g/dL (ref 30.0–36.0)
MCV: 79 fL — ABNORMAL LOW (ref 80.0–100.0)
Platelets: 243 10*3/uL (ref 150–400)
RBC: 4.04 MIL/uL (ref 3.87–5.11)
RDW: 16.3 % — ABNORMAL HIGH (ref 11.5–15.5)
WBC: 10.2 10*3/uL (ref 4.0–10.5)
nRBC: 0 % (ref 0.0–0.2)

## 2021-02-20 LAB — COMPREHENSIVE METABOLIC PANEL
ALT: 189 U/L — ABNORMAL HIGH (ref 0–44)
AST: 234 U/L — ABNORMAL HIGH (ref 15–41)
Albumin: 2.8 g/dL — ABNORMAL LOW (ref 3.5–5.0)
Alkaline Phosphatase: 67 U/L (ref 38–126)
Anion gap: 9 (ref 5–15)
BUN: 9 mg/dL (ref 8–23)
CO2: 27 mmol/L (ref 22–32)
Calcium: 8.1 mg/dL — ABNORMAL LOW (ref 8.9–10.3)
Chloride: 104 mmol/L (ref 98–111)
Creatinine, Ser: 0.86 mg/dL (ref 0.44–1.00)
GFR, Estimated: >60 mL/min (ref >60)
Glucose, Bld: 127 mg/dL — ABNORMAL HIGH (ref 70–99)
Potassium: 3.3 mmol/L — ABNORMAL LOW (ref 3.5–5.1)
Sodium: 140 mmol/L (ref 135–145)
Total Bilirubin: 1.1 mg/dL (ref 0.3–1.2)
Total Protein: 5.6 g/dL — ABNORMAL LOW (ref 6.5–8.1)

## 2021-02-20 MED ORDER — POTASSIUM CHLORIDE CRYS ER 20 MEQ PO TBCR
40.0000 meq | EXTENDED_RELEASE_TABLET | Freq: Two times a day (BID) | ORAL | Status: DC
  Administered 2021-02-20 – 2021-02-25 (×11): 40 meq via ORAL
  Filled 2021-02-20 (×11): qty 2

## 2021-02-20 MED ORDER — TORSEMIDE 20 MG PO TABS
30.0000 mg | ORAL_TABLET | Freq: Every day | ORAL | Status: DC
  Administered 2021-02-20: 30 mg via ORAL
  Filled 2021-02-20: qty 2
  Filled 2021-02-20: qty 1

## 2021-02-20 NOTE — Progress Notes (Signed)
New transfer from 6N due to continuation of care. Ongoing dilaudid via PCA, VSS. Husband at bedside,call bell within reach, bed at lowest level.

## 2021-02-20 NOTE — Progress Notes (Signed)
1 Day Post-Op Exploratory laparotomy, lysis of adhesions, fenestration of hepatic cysts Subjective: Patient's pain controlled with PCA, trying some clears today, denies nausea  Objective: Vital signs in last 24 hours: Temp:  [97 F (36.1 C)-99.1 F (37.3 C)] 98.7 F (37.1 C) (08/20 0722) Pulse Rate:  [62-80] 71 (08/20 0810) Resp:  [12-22] 19 (08/20 0811) BP: (104-161)/(61-92) 125/63 (08/20 0722) SpO2:  [92 %-98 %] 96 % (08/20 0811) Arterial Line BP: (85-199)/(54-82) 85/54 (08/19 1700) FiO2 (%):  [26 %-28 %] 26 % (08/20 0400)   Intake/Output from previous day: 08/19 0701 - 08/20 0700 In: 2844.5 [I.V.:2444.5; IV Piggyback:400] Out: 950 [Urine:850; Blood:100] Intake/Output this shift: No intake/output data recorded.   General appearance: alert and cooperative Resp: nonlabored breathing GI: soft, non-distended  Incision: no significant drainage, no significant erythema  Lab Results:  Recent Labs    02/20/21 0439  WBC 10.2  HGB 10.1*  HCT 31.9*  PLT 243   BMET Recent Labs    02/20/21 0439  NA 140  K 3.3*  CL 104  CO2 27  GLUCOSE 127*  BUN 9  CREATININE 0.86  CALCIUM 8.1*   PT/INR No results for input(s): LABPROT, INR in the last 72 hours. ABG No results for input(s): PHART, HCO3 in the last 72 hours.  Invalid input(s): PCO2, PO2  MEDS, Scheduled  sodium chloride   Intravenous Once   acetaminophen  1,000 mg Oral TID   docusate sodium  100 mg Oral BID   enoxaparin (LOVENOX) injection  40 mg Subcutaneous Q24H   HYDROmorphone   Intravenous Q4H   ketorolac  15 mg Intravenous Q8H   methocarbamol  500 mg Oral QID   metoprolol succinate  25 mg Oral Daily   pantoprazole  40 mg Oral BID   potassium chloride SA  40 mEq Oral BID   torsemide  30 mg Oral Daily    Studies/Results: No results found.  Assessment: s/p Procedure(s): OPEN FENESTRATION OF HEPATIC CYST WITH INTRAOPERATIVE ULTRASOUND Patient Active Problem List   Diagnosis Date Noted   Hepatic  cyst 06/12/2020   Polycystic liver disease 11/09/2019   Polycystic liver disease, congenital 11/08/2019   Right upper quadrant abdominal pain    Hypokalemia    Obesity (BMI 30-39.9)    Liver cyst 11/15/2018   Class 3 severe obesity with serious comorbidity and body mass index (BMI) of 40.0 to 44.9 in adult [facility]) 05/30/2018   Prediabetes 04/11/2018   DOE (dyspnea on exertion) 01/17/2018   Morbid obesity due to excess calories ([facility]CC) 01/17/2018   Upper airway cough syndrome 01/16/2018   Chest pain 08/28/2012   Abnormal cardiovascular function study 08/28/2012    Expected post op course  Plan: d/c foley Clears today Cont IVF's until pt tolerating a diet Ambulate in hall Vitals stable, transfer to 6N   LOS: 1 day     .Vanita Panda, MD [facility], [place]    02/20/2021 9:08 AM

## 2021-02-20 NOTE — Evaluation (Signed)
Physical Therapy Evaluation Patient Details Name: Jasmine Contreras MRN: [id number] DOB: 04/22/1951 Today's Date: 02/20/2021   History of Present Illness  Pt is a 70 y.o. female who presented 02/19/21 with recurrence of benign symptomatic hepatic cysts. S/p exploratory laparotomy, lysis of adhesions, and fenesatration of hepatic cysts 8/19. PMH: anemia, arthritis, asthma, depression, double vision, dysrhythmia, gout, heart murmur, hepatitis, herpes, HTN, IBS, MVP, TIA, tinnitus, vertigo   Clinical Impression  Pt presents with condition above and deficits mentioned below, see PT Problem List. PTA, pt was independent and living with her husband in a house with 1 STE. Occasionally she would use a SPC during gout flair ups though. Currently, pt displays generalized weakness and significant pain impacting her balance, activity tolerance, and independence with functional mobility. She is needing min-modA for bed mobility, progressed from modA to min guard assist during session for transfers, and min guard assist to ambulate up to ~3 ft with a RW this date. Expect as pt's pain improves she will make great progress back to her baseline. All PT needs can be met acutely at this time.    Follow Up Recommendations No PT follow up;Supervision for mobility/OOB    Equipment Recommendations  Rolling walker with 5" wheels;Other (comment) (shower chair)    Recommendations for Other Services       Precautions / Restrictions Precautions Precautions: Fall Restrictions Weight Bearing Restrictions: No      Mobility  Bed Mobility Overal bed mobility: Needs Assistance Bed Mobility: Supine to Sit;Sit to Supine     Supine to sit: Min assist;HOB elevated Sit to supine: Mod assist;HOB elevated   General bed mobility comments: Extra time with pt using bed rails and bed controls for all aspects of bed mobility. Needed minA towards end of transition supine > sit to complete trunk ascent and square hips with EOB.  ModA to lift and pivot legs onto bed.    Transfers Overall transfer level: Needs assistance Equipment used: Rolling walker (2 wheeled) Transfers: Sit to/from [facility] Sit to Stand: Mod assist;Min guard Stand pivot transfers: Min guard       General transfer comment: During first sit to stand from EOB pt required modA to power up and steady to RW. Second bout from recliner she only needed min guard assist, but extra time to complete. Min guard assist to stand step bed <> recliner with RW.  Ambulation/Gait Ambulation/Gait assistance: Min guard Gait Distance (Feet): 3 Feet (x2 bouts of ~3 ft each bout) Assistive device: Rolling walker (2 wheeled) Gait Pattern/deviations: Step-through pattern;Decreased stride length;Trunk flexed;Shuffle Gait velocity: reduced Gait velocity interpretation: <1.31 ft/sec, indicative of household ambulator General Gait Details: Pt takes small shuffling lateral steps with trunk flexed, likely due to abdominal pain, to transfer bed <> recliner with a RW. No LOB, but extra time due to slow movement, min guard assist.  Stairs            Wheelchair Mobility    Modified Rankin (Stroke Patients Only)       Balance Overall balance assessment: Needs assistance Sitting-balance support: Single extremity supported;Feet supported Sitting balance-Leahy Scale: Poor Sitting balance - Comments: UE support to sit with supervision EOB.   Standing balance support: Bilateral upper extremity supported;During functional activity Standing balance-Leahy Scale: Poor Standing balance comment: Reliant on UE support for mobility.                             Pertinent Vitals/Pain Pain  Assessment: Faces Faces Pain Scale: Hurts whole lot Pain Location: abdomen; surgical site Pain Descriptors / Indicators: Discomfort;Grimacing;Guarding;Operative site guarding Pain Intervention(s): Limited activity within patient's tolerance;Monitored during  session;Repositioned    Home Living Family/patient expects to be discharged to:: Private residence Living Arrangements: Spouse/significant other Available Help at Discharge: Family;Available 24 hours/day (initially) Type of Home: House Home Access: Stairs to enter Entrance Stairs-Rails: None Entrance Stairs-Number of Steps: 1 Home Layout: One level Home Equipment: Cane - single point;Shower seat;Toilet riser      Prior Function Level of Independence: Independent         Comments: Uses SPC during gout flair ups only, otherwise no AD. Drives. Does not work.     Hand Dominance   Dominant Hand: Right    Extremity/Trunk Assessment   Upper Extremity Assessment Upper Extremity Assessment: Generalized weakness    Lower Extremity Assessment Lower Extremity Assessment: Generalized weakness    Cervical / Trunk Assessment Cervical / Trunk Assessment: Other exceptions Cervical / Trunk Exceptions: s/p abdominal sx  Communication   Communication: No difficulties  Cognition Arousal/Alertness: Awake/alert Behavior During Therapy: Flat affect Overall Cognitive Status: Within Functional Limits for tasks assessed                                 General Comments: Flat affect likely due to pain throughout. Follows commands appropriately and is able to sequence tasks safely.      General Comments General comments (skin integrity, edema, etc.): VSS    Exercises     Assessment/Plan    PT Assessment Patient needs continued PT services  PT Problem List Decreased strength;Decreased range of motion;Decreased activity tolerance;Decreased balance;Decreased mobility;Decreased coordination;Decreased knowledge of use of DME;Decreased knowledge of precautions;Pain;Decreased skin integrity       PT Treatment Interventions DME instruction;Gait training;Stair training;Functional mobility training;Therapeutic activities;Therapeutic exercise;Balance training;Neuromuscular  re-education;Patient/family education    PT Goals (Current goals can be found in the Care Plan section)  Acute Rehab PT Goals Patient Stated Goal: to get better PT Goal Formulation: With patient/family Time For Goal Achievement: 03/06/21 Potential to Achieve Goals: Good    Frequency Min 3X/week   Barriers to discharge        Co-evaluation               AM-PAC PT "6 Clicks" Mobility  Outcome Measure Help needed turning from your back to your side while in a flat bed without using bedrails?: A Little Help needed moving from lying on your back to sitting on the side of a flat bed without using bedrails?: A Little Help needed moving to and from a bed to a chair (including a wheelchair)?: A Little Help needed standing up from a chair using your arms (e.g., wheelchair or bedside chair)?: A Little Help needed to walk in hospital room?: A Little Help needed climbing 3-5 steps with a railing? : A Lot 6 Click Score: 17    End of Session Equipment Utilized During Treatment: Oxygen Activity Tolerance: Patient limited by pain Patient left: in bed;with call bell/phone within reach;with bed alarm set;with family/visitor present Nurse Communication: Mobility status PT Visit Diagnosis: Unsteadiness on feet (R26.81);Other abnormalities of gait and mobility (R26.89);Muscle weakness (generalized) (M62.81);Difficulty in walking, not elsewhere classified (R26.2);Pain Pain - part of body:  (abdomen)    Time: 1122-1203 PT Time Calculation (min) (ACUTE ONLY): 41 min   Charges:   PT Evaluation $PT Eval Moderate Complexity: 1 Mod PT Treatments $  Therapeutic Activity: 23-37 mins        Moishe Spice, PT, DPT Acute Rehabilitation Services  Pager: [phone] Office: [place] 02/20/2021, 1:07 PM

## 2021-02-21 LAB — COMPREHENSIVE METABOLIC PANEL
ALT: 156 U/L — ABNORMAL HIGH (ref 0–44)
ALT: 143 U/L — ABNORMAL HIGH (ref 0–44)
AST: 123 U/L — ABNORMAL HIGH (ref 15–41)
AST: 106 U/L — ABNORMAL HIGH (ref 15–41)
Albumin: 2.8 g/dL — ABNORMAL LOW (ref 3.5–5.0)
Albumin: 2.8 g/dL — ABNORMAL LOW (ref 3.5–5.0)
Alkaline Phosphatase: 75 U/L (ref 38–126)
Alkaline Phosphatase: 71 U/L (ref 38–126)
Anion gap: 6 (ref 5–15)
Anion gap: 7 (ref 5–15)
BUN: 12 mg/dL (ref 8–23)
BUN: 12 mg/dL (ref 8–23)
CO2: 28 mmol/L (ref 22–32)
CO2: 27 mmol/L (ref 22–32)
Calcium: 7.8 mg/dL — ABNORMAL LOW (ref 8.9–10.3)
Calcium: 7.9 mg/dL — ABNORMAL LOW (ref 8.9–10.3)
Chloride: 106 mmol/L (ref 98–111)
Chloride: 107 mmol/L (ref 98–111)
Creatinine, Ser: 1.02 mg/dL — ABNORMAL HIGH (ref 0.44–1.00)
Creatinine, Ser: 1.03 mg/dL — ABNORMAL HIGH (ref 0.44–1.00)
GFR, Estimated: 59 mL/min — ABNORMAL LOW (ref >60)
GFR, Estimated: 58 mL/min — ABNORMAL LOW (ref >60)
Glucose, Bld: 122 mg/dL — ABNORMAL HIGH (ref 70–99)
Glucose, Bld: 110 mg/dL — ABNORMAL HIGH (ref 70–99)
Potassium: 3.5 mmol/L (ref 3.5–5.1)
Potassium: 3.6 mmol/L (ref 3.5–5.1)
Sodium: 140 mmol/L (ref 135–145)
Sodium: 141 mmol/L (ref 135–145)
Total Bilirubin: 1.2 mg/dL (ref 0.3–1.2)
Total Bilirubin: 1.3 mg/dL — ABNORMAL HIGH (ref 0.3–1.2)
Total Protein: 5.8 g/dL — ABNORMAL LOW (ref 6.5–8.1)
Total Protein: 5.9 g/dL — ABNORMAL LOW (ref 6.5–8.1)

## 2021-02-21 LAB — CBC
HCT: 31.2 % — ABNORMAL LOW (ref 36.0–46.0)
Hemoglobin: 9.9 g/dL — ABNORMAL LOW (ref 12.0–15.0)
MCH: 25.4 pg — ABNORMAL LOW (ref 26.0–34.0)
MCHC: 31.7 g/dL (ref 30.0–36.0)
MCV: 80.2 fL (ref 80.0–100.0)
Platelets: 214 10*3/uL (ref 150–400)
RBC: 3.89 MIL/uL (ref 3.87–5.11)
RDW: 16.7 % — ABNORMAL HIGH (ref 11.5–15.5)
WBC: 8.9 10*3/uL (ref 4.0–10.5)
nRBC: 0 % (ref 0.0–0.2)

## 2021-02-21 MED ORDER — OXYCODONE HCL 5 MG PO TABS
5.0000 mg | ORAL_TABLET | ORAL | Status: DC | PRN
  Administered 2021-02-21: 10 mg via ORAL
  Administered 2021-02-22 (×2): 5 mg via ORAL
  Administered 2021-02-22 – 2021-02-25 (×6): 10 mg via ORAL
  Filled 2021-02-21 (×10): qty 2
  Filled 2021-02-21: qty 1
  Filled 2021-02-21: qty 2

## 2021-02-21 MED ORDER — HYDROMORPHONE HCL 1 MG/ML IJ SOLN: 0.5000 mg | INTRAMUSCULAR | Status: DC | PRN

## 2021-02-21 NOTE — Progress Notes (Signed)
Physical Therapy Treatment Patient Details Name: Jasmine Contreras MRN: [id number] DOB: 05/15/1951 Today's Date: 02/21/2021    History of Present Illness Pt is a 70 y.o. female who presented 02/19/21 with recurrence of benign symptomatic hepatic cysts. S/p exploratory laparotomy, lysis of adhesions, and fenesatration of hepatic cysts 8/19. PMH: anemia, arthritis, asthma, depression, double vision, dysrhythmia, gout, heart murmur, hepatitis, herpes, HTN, IBS, MVP, TIA, tinnitus, vertigo    PT Comments    Pt was able to progress to ambulating up to ~15 ft with a RW and min guard assist today. She continues to be limited in further mobility by pain and nausea. Pt needs reminders for safe hand placement with transfers. Will continue to follow acutely. Current recommendations remain appropriate.   Follow Up Recommendations  No PT follow up;Supervision for mobility/OOB     Equipment Recommendations  Rolling walker with 5" wheels;Other (comment) (shower chair)    Recommendations for Other Services       Precautions / Restrictions Precautions Precautions: Fall Restrictions Weight Bearing Restrictions: No    Mobility  Bed Mobility Overal bed mobility: Needs Assistance Bed Mobility: Sit to Supine       Sit to supine: Mod assist;HOB elevated   General bed mobility comments: ModA to lift and pivot legs onto bed, cues to grab bed rail with UE to assist in pulling trunk to pivot.    Transfers Overall transfer level: Needs assistance Equipment used: Rolling walker (2 wheeled) Transfers: Sit to/from Stand Sit to Stand: Min guard         General transfer comment: Sit to stand from toilet using L wall rail to pull up, min guard assist and extra time. x2 additional sit to stands from EOB, cuing pt to have one hand on bed with sit <> stand to ensure safety.  Ambulation/Gait Ambulation/Gait assistance: Min guard Gait Distance (Feet): 15 Feet Assistive device: Rolling walker (2  wheeled) Gait Pattern/deviations: Step-through pattern;Decreased stride length;Trunk flexed;Shuffle;Wide base of support Gait velocity: reduced Gait velocity interpretation: <1.31 ft/sec, indicative of household ambulator General Gait Details: Pt takes small, shuffling steps with trunk flexed to protect abdomen. Wide BOS. cues to increase feet clearance, min success. No LOB, min guard assist for safety.   Stairs             Wheelchair Mobility    Modified Rankin (Stroke Patients Only)       Balance Overall balance assessment: Needs assistance Sitting-balance support: Feet supported;No upper extremity supported Sitting balance-Leahy Scale: Fair Sitting balance - Comments: Able to sit EOB with supervision.   Standing balance support: Bilateral upper extremity supported;During functional activity;Single extremity supported Standing balance-Leahy Scale: Poor Standing balance comment: Reliant on UE support for reaching off BOS with other hand or for mobility.                            Cognition Arousal/Alertness: Awake/alert Behavior During Therapy: WFL for tasks assessed/performed Overall Cognitive Status: Within Functional Limits for tasks assessed                                        Exercises      General Comments        Pertinent Vitals/Pain Pain Assessment: Faces Faces Pain Scale: Hurts whole lot Pain Location: abdomen; surgical site Pain Descriptors / Indicators: Discomfort;Grimacing;Guarding;Operative site guarding Pain Intervention(s): Limited activity within  patient's tolerance;Monitored during session;Repositioned;RN gave pain meds during session    Home Living                      Prior Function            PT Goals (current goals can now be found in the care plan section) Acute Rehab PT Goals Patient Stated Goal: to get better PT Goal Formulation: With patient Time For Goal Achievement: 03/06/21 Potential  to Achieve Goals: Good Progress towards PT goals: Progressing toward goals    Frequency    Min 3X/week      PT Plan Current plan remains appropriate    Co-evaluation              AM-PAC PT "6 Clicks" Mobility   Outcome Measure  Help needed turning from your back to your side while in a flat bed without using bedrails?: A Little Help needed moving from lying on your back to sitting on the side of a flat bed without using bedrails?: A Little Help needed moving to and from a bed to a chair (including a wheelchair)?: A Little Help needed standing up from a chair using your arms (e.g., wheelchair or bedside chair)?: A Little Help needed to walk in hospital room?: A Little Help needed climbing 3-5 steps with a railing? : A Lot 6 Click Score: 17    End of Session   Activity Tolerance: Patient limited by pain;Other (comment) (and nausea) Patient left: in bed;with call bell/phone within reach;with bed alarm set;with SCD's reapplied Nurse Communication: Mobility status PT Visit Diagnosis: Unsteadiness on feet (R26.81);Other abnormalities of gait and mobility (R26.89);Muscle weakness (generalized) (M62.81);Difficulty in walking, not elsewhere classified (R26.2);Pain Pain - part of body:  (abdomen)     Time: 0812-0827 PT Time Calculation (min) (ACUTE ONLY): 15 min  Charges:  $Therapeutic Activity: 8-22 mins                     Raymond Gurney, PT, DPT Acute Rehabilitation Services  Pager: [phone] Office: [phone]    Jewel Baize 02/21/2021, 8:33 AM

## 2021-02-21 NOTE — Progress Notes (Signed)
2 Days Post-Op  Subjective: Afebrile, vitals stable. Voiding without difficulty. Reports significant pain. Mild increase in creatinine from 0.1 to 1. Denies nausea or vomiting, tolerating clears and would like to eat more today.   Objective: Vital signs in last 24 hours: Temp:  [98 F (36.7 C)-98.7 F (37.1 C)] 98 F (36.7 C) (08/21 0345) Pulse Rate:  [57-76] 65 (08/21 0345) Resp:  [15-20] 18 (08/21 0424) BP: (100-133)/(52-72) 124/64 (08/21 0345) SpO2:  [91 %-100 %] 98 % (08/21 0424) FiO2 (%):  [0 %] 0 % (08/21 0424) Last BM Date:  (PTA)  Intake/Output from previous day: 08/20 0701 - 08/21 0700 In: 348.5 [P.O.:240; I.V.:108.5] Out: 1000 [Urine:1000] Intake/Output this shift: Total I/O In: 240 [P.O.:240] Out: -   PE: General: resting comfortably, NAD Neuro: alert and oriented, no focal deficits Resp: normal work of breathing CV: RRR Abdomen: soft, nondistended, mildly tender. Ecchymosis around midline skin incision, no induration or drainage. Extremities: warm and well-perfused   Lab Results:  Recent Labs    02/20/21 0439 02/21/21 0110  WBC 10.2 8.9  HGB 10.1* 9.9*  HCT 31.9* 31.2*  PLT 243 214   BMET Recent Labs    02/20/21 0439 02/21/21 0110  NA 140 140  K 3.3* 3.5  CL 104 106  CO2 27 28  GLUCOSE 127* 122*  BUN 9 12  CREATININE 0.86 1.02*  CALCIUM 8.1* 7.8*   PT/INR No results for input(s): LABPROT, INR in the last 72 hours. CMP     Component Value Date/Time   NA 140 02/21/2021 0110   NA 139 08/01/2018 1303   K 3.5 02/21/2021 0110   CL 106 02/21/2021 0110   CO2 28 02/21/2021 0110   GLUCOSE 122 (H) 02/21/2021 0110   BUN 12 02/21/2021 0110   BUN 5 (L) 08/01/2018 1303   CREATININE 1.02 (H) 02/21/2021 0110   CALCIUM 7.8 (L) 02/21/2021 0110   PROT 5.8 (L) 02/21/2021 0110   PROT 7.2 08/01/2018 1303   ALBUMIN 2.8 (L) 02/21/2021 0110   ALBUMIN 4.4 08/01/2018 1303   AST 123 (H) 02/21/2021 0110   ALT 156 (H) 02/21/2021 0110   ALKPHOS 75  02/21/2021 0110   BILITOT 1.2 02/21/2021 0110   BILITOT 0.5 08/01/2018 1303   GFRNONAA 59 (L) 02/21/2021 0110   GFRAA >60 11/09/2019 0449   Lipase     Component Value Date/Time   LIPASE 25 11/15/2018 1312       Studies/Results: No results found.  Anti-infectives: Anti-infectives (From admission, onward)    Start     Dose/Rate Route Frequency Ordered Stop   02/19/21 0600  ceFAZolin (ANCEF) IVPB 3g/100 mL premix        3 g 200 mL/hr over 30 Minutes Intravenous On call to O.R. 02/19/21 0547 02/19/21 0803   02/19/21 0557  ceFAZolin (ANCEF) 2-4 GM/100ML-% IVPB       Note to Pharmacy: Leonie Man   : cabinet override      02/19/21 0557 02/19/21 1759        Assessment/Plan 70 yo female with recurrent hepatic cysts, POD2 s/p open fenestration.  - Advance to regular diet - Continue IVF at 50 given mild increase in creatinine - Discontinue toradol and torsemide, recheck creatinine and electrolytes tomorrow morning - Transition to oral oxycodone, discontinue PCA. Continue scheduled tylenol and robaxin. - VTE: lovenox, SCDs - Dispo: med-surg floor    LOS: 2 days    Sophronia Simas, MD [facility] Surgery General, Hepatobiliary and Pancreatic Surgery  02/21/21 5:51 AM

## 2021-02-21 NOTE — Evaluation (Signed)
Occupational Therapy Evaluation Patient Details Name: Jasmine Contreras MRN: [id number] DOB: 1950-12-11 Today's Date: 02/21/2021    History of Present Illness Pt is a 70 y.o. female who presented 02/19/21 with recurrence of benign symptomatic hepatic cysts. S/p exploratory laparotomy, lysis of adhesions, and fenesatration of hepatic cysts 8/19. PMH: anemia, arthritis, asthma, depression, double vision, dysrhythmia, gout, heart murmur, hepatitis, herpes, HTN, IBS, MVP, TIA, tinnitus, vertigo   Clinical Impression   Pt admitted for concerns listed above. PTA pt reported that she was independent with all ADL's and IADL's, including driving and throwing themed parties. At this time, pt is limited by pain, unable to bend or twist. She is requiring max A for LB ADL's and min guard to min A for all OOB mobility. At this time, pt is expected to progress back to independence once pain subsides. OT will follow acutely for progression.    Follow Up Recommendations  No OT follow up;Supervision - Intermittent    Equipment Recommendations  Tub/shower seat;Other (comment) (RW, [place] would be nice if it were bariatric for pt.)    Recommendations for Other Services       Precautions / Restrictions Precautions Precautions: Fall Restrictions Weight Bearing Restrictions: No      Mobility Bed Mobility Overal bed mobility: Needs Assistance Bed Mobility: Sit to Supine;Supine to Sit     Supine to sit: Mod assist;HOB elevated Sit to supine: Mod assist;HOB elevated   General bed mobility comments: ModA to lift and pivot legs onto bed, cues to grab bed rail with UE to assist in pulling trunk to pivot.    Transfers Overall transfer level: Needs assistance Equipment used: Rolling walker (2 wheeled) Transfers: Sit to/from Stand Sit to Stand: Min guard              Balance Overall balance assessment: Needs assistance Sitting-balance support: Feet supported;No upper extremity supported Sitting  balance-Leahy Scale: Fair Sitting balance - Comments: Able to sit EOB with supervision.   Standing balance support: Bilateral upper extremity supported;During functional activity;Single extremity supported Standing balance-Leahy Scale: Poor Standing balance comment: Reliant on UE support for reaching off BOS with other hand or for mobility.                           ADL either performed or assessed with clinical judgement   ADL Overall ADL's : Needs assistance/impaired Eating/Feeding: Set up;Sitting   Grooming: Set up;Sitting   Upper Body Bathing: Minimal assistance;Sitting   Lower Body Bathing: Maximal assistance;Sitting/lateral leans;Sit to/from stand   Upper Body Dressing : Minimal assistance;Sitting   Lower Body Dressing: Maximal assistance;Sitting/lateral leans;Sit to/from stand   Toilet Transfer: Min guard;Stand-pivot   Toileting- Architect and Hygiene: Moderate assistance;Sitting/lateral lean;Sit to/from stand   Tub/ Engineer, structural: Min guard;Stand-pivot   Functional mobility during ADLs: Min guard;Rolling walker General ADL Comments: Pt limited by pain, requiring max A for all ADL's requiring bending and twisting     Vision Baseline Vision/History: No visual deficits Patient Visual Report: No change from baseline Vision Assessment?: No apparent visual deficits     Perception Perception Perception Tested?: No   Praxis Praxis Praxis tested?: Not tested    Pertinent Vitals/Pain Pain Assessment: Faces Faces Pain Scale: Hurts whole lot Pain Location: abdomen; surgical site Pain Descriptors / Indicators: Discomfort;Grimacing;Guarding;Operative site guarding Pain Intervention(s): RN gave pain meds during session;Monitored during session;Repositioned     Hand Dominance Right   Extremity/Trunk Assessment Upper Extremity Assessment Upper Extremity Assessment:  Overall [facility] for tasks assessed   Lower Extremity Assessment Lower Extremity  Assessment: Defer to PT evaluation   Cervical / Trunk Assessment Cervical / Trunk Assessment: Other exceptions Cervical / Trunk Exceptions: s/p abdominal sx   Communication Communication Communication: No difficulties   Cognition Arousal/Alertness: Awake/alert Behavior During Therapy: WFL for tasks assessed/performed Overall Cognitive Status: Within Functional Limits for tasks assessed                                     General Comments  VSS on RA    Exercises     Shoulder Instructions      Home Living Family/patient expects to be discharged to:: Private residence Living Arrangements: Spouse/significant other Available Help at Discharge: Family;Available 24 hours/day Type of Home: House Home Access: Stairs to enter [employer] of Steps: 1 Entrance Stairs-Rails: None Home Layout: One level     Bathroom Shower/Tub: Producer, television/film/video: Handicapped height Bathroom Accessibility: Yes How Accessible: Accessible via walker Home Equipment: Cane - single point;Shower seat;Toilet riser          Prior Functioning/Environment Level of Independence: Independent        Comments: Uses SPC during gout flair ups only, otherwise no AD. Drives. Does not work.        OT Problem List: Decreased strength;Decreased activity tolerance;Impaired balance (sitting and/or standing);Decreased safety awareness;Decreased knowledge of use of DME or AE;Pain      OT Treatment/Interventions: Self-care/ADL training;Therapeutic exercise;Energy conservation;DME and/or AE instruction;Therapeutic activities;Patient/family education;Balance training    OT Goals(Current goals can be found in the care plan section) Acute Rehab OT Goals Patient Stated Goal: to get better OT Goal Formulation: With patient Time For Goal Achievement: 03/07/21 Potential to Achieve Goals: Good ADL Goals Pt Will Perform Grooming: with modified independence;standing Pt Will  Perform Lower Body Bathing: with min assist;sitting/lateral leans;sit to/from stand;with adaptive equipment Pt Will Perform Lower Body Dressing: with min assist;with adaptive equipment;sitting/lateral leans;sit to/from stand Pt Will Transfer to Toilet: with supervision;ambulating Pt Will Perform Toileting - Clothing Manipulation and hygiene: with min assist;with adaptive equipment;sitting/lateral leans;sit to/from stand  OT Frequency: Min 2X/week   Barriers to D/C:            Co-evaluation              AM-PAC OT "6 Clicks" Daily Activity     Outcome Measure Help from another person eating meals?: A Little Help from another person taking care of personal grooming?: A Little Help from another person toileting, which includes using toliet, bedpan, or urinal?: A Little Help from another person bathing (including washing, rinsing, drying)?: A Lot Help from another person to put on and taking off regular upper body clothing?: A Little Help from another person to put on and taking off regular lower body clothing?: A Lot 6 Click Score: 16   End of Session Equipment Utilized During Treatment: Gait belt;Rolling walker Nurse Communication: Mobility status  Activity Tolerance: Patient limited by pain;Patient tolerated treatment well Patient left: in bed;with call bell/phone within reach;with family/visitor present  OT Visit Diagnosis: Unsteadiness on feet (R26.81);Other abnormalities of gait and mobility (R26.89);Muscle weakness (generalized) (M62.81);Pain Pain - Right/Left:  (Abdomen) Pain - part of body:  (Abdomen)                Time: 6389-3734 OT Time Calculation (min): 25 min Charges:  OT General Charges $OT Visit: 1 Visit  OT Evaluation $OT Eval Moderate Complexity: 1 Mod OT Treatments $Self Care/Home Management : 8-22 mins  Kahlie Deutscher H., OTR/L Acute Rehabilitation  Rosita Guzzetta Elane Bing Plume 02/21/2021, 7:12 PM

## 2021-02-22 LAB — SURGICAL PATHOLOGY

## 2021-02-22 NOTE — Progress Notes (Signed)
    3 Days Post-Op  Subjective: Afebrile, vitals stable. No acute changes. Creatinine stable at 1.0. Reports she was out of bed 3 times yesterday, sat in chair and ambulated in room. Occasional nausea but no vomiting. Tolerating some solid food.   Objective: Vital signs in last 24 hours: Temp:  [97.9 F (36.6 C)-99.3 F (37.4 C)] 98.5 F (36.9 C) (08/22 0605) Pulse Rate:  [68-81] 80 (08/22 0605) Resp:  [16-18] 16 (08/22 0605) BP: (113-141)/(63-73) 139/73 (08/22 0605) SpO2:  [92 %-98 %] 92 % (08/22 0605) Last BM Date:  (PTA)  Intake/Output from previous day: 08/21 0701 - 08/22 0700 In: 1587.3 [P.O.:550; I.V.:1037.3] Out: -  Intake/Output this shift: No intake/output data recorded.  PE: General: resting comfortably, NAD Neuro: alert and oriented, no focal deficits Resp: normal work of breathing Abdomen: soft, nondistended, mildly tender. Ecchymosis around midline skin incision, no induration or drainage. Extremities: warm and well-perfused   Lab Results:  Recent Labs    02/20/21 0439 02/21/21 0110  WBC 10.2 8.9  HGB 10.1* 9.9*  HCT 31.9* 31.2*  PLT 243 214   BMET Recent Labs    02/21/21 0110 02/21/21 0632  NA 140 141  K 3.5 3.6  CL 106 107  CO2 28 27  GLUCOSE 122* 110*  BUN 12 12  CREATININE 1.02* 1.03*  CALCIUM 7.8* 7.9*   PT/INR No results for input(s): LABPROT, INR in the last 72 hours. CMP     Component Value Date/Time   NA 141 02/21/2021 0632   NA 139 08/01/2018 1303   K 3.6 02/21/2021 0632   CL 107 02/21/2021 0632   CO2 27 02/21/2021 0632   GLUCOSE 110 (H) 02/21/2021 0632   BUN 12 02/21/2021 0632   BUN 5 (L) 08/01/2018 1303   CREATININE 1.03 (H) 02/21/2021 0632   CALCIUM 7.9 (L) 02/21/2021 0632   PROT 5.9 (L) 02/21/2021 0632   PROT 7.2 08/01/2018 1303   ALBUMIN 2.8 (L) 02/21/2021 0632   ALBUMIN 4.4 08/01/2018 1303   AST 106 (H) 02/21/2021 0632   ALT 143 (H) 02/21/2021 0632   ALKPHOS 71 02/21/2021 0632   BILITOT 1.3 (H) 02/21/2021  0632   BILITOT 0.5 08/01/2018 1303   GFRNONAA 58 (L) 02/21/2021 0632   GFRAA >60 11/09/2019 0449   Lipase     Component Value Date/Time   LIPASE 25 11/15/2018 1312        Assessment/Plan 70 yo female with recurrent hepatic cysts, POD3 s/p open fenestration.  - Continue regular diet, SLIV - Multimodal pain control: oxycodone, dilaudid, scheduled tylenol and robaxin - Ambulate in hall today, PT/OT following - VTE: lovenox, SCDs - Dispo: med-surg floor    LOS: 3 days    Sophronia Simas, MD [facility] Surgery General, Hepatobiliary and Pancreatic Surgery 02/22/21 7:13 AM

## 2021-02-22 NOTE — Care Management Important Message (Signed)
Important Message  Patient Details  Name: Jasmine Contreras MRN: [id number] Date of Birth: [birth year]   Medicare Important Message Given:  Yes     [name] 02/22/2021, 4:14 PM

## 2021-02-22 NOTE — Progress Notes (Signed)
Occupational Therapy Treatment Patient Details Name: Jasmine Contreras MRN: [id number] DOB: 03-Jun-1951 Today's Date: 02/22/2021    History of present illness 70 y.o. female who presented 02/19/21 with recurrence of benign symptomatic hepatic cysts. S/p exploratory laparotomy, lysis of adhesions, and fenesatration of hepatic cysts 8/19. PMH: anemia, arthritis, asthma, depression, double vision, dysrhythmia, gout, heart murmur, hepatitis, herpes, HTN, IBS, MVP, TIA, tinnitus, vertigo   OT comments  Pt progressing towards established OT goals. Pt continues to present with pain and decreased activity tolerance. Pt performing functional mobility in hallway to simulated home distance with Min Guard A level and RW. Providing education on LB dressing compensatory techniques to decrease pain; pt verbalized understanding. Also reports husband will assist with LB ADLs at home as needed. Continue to recommend dc to home and will continue to follow acutely as admitted.    Follow Up Recommendations  No OT follow up;Supervision - Intermittent    Equipment Recommendations  Tub/shower seat;Other (comment) (RW. If able, bariatric shower seat)    Recommendations for Other Services      Precautions / Restrictions Precautions Precautions: Fall       Mobility Bed Mobility Overal bed mobility: Needs Assistance Bed Mobility: Sit to Supine;Supine to Sit     Supine to sit: HOB elevated;Min guard Sit to supine: HOB elevated;Min assist   General bed mobility comments: Min A for bringing BLEs over EOB in return to supine    Transfers Overall transfer level: Needs assistance Equipment used: Rolling walker (2 wheeled) Transfers: Sit to/from Stand Sit to Stand: Min guard         General transfer comment: Min Guard A for safety. increased time and effort. cues for weight shift forward    Balance Overall balance assessment: Needs assistance Sitting-balance support: Feet supported;No upper extremity  supported Sitting balance-Leahy Scale: Fair     Standing balance support: During functional activity;No upper extremity supported;Bilateral upper extremity supported Standing balance-Leahy Scale: Fair Standing balance comment: Pt able to maintain static standing to don mask                           ADL either performed or assessed with clinical judgement   ADL Overall ADL's : Needs assistance/impaired                       Lower Body Dressing Details (indicate cue type and reason): Educated on compensatory techniques for LB dressing. Pt reports husband will assist with socks and shoes Toilet Transfer: Min guard;Ambulation;RW (simulated to recliner) Toilet Transfer Details (indicate cue type and reason): Min Guard A for safety. increased time and effort. cues for weight shift forward         Functional mobility during ADLs: Min guard;Rolling walker General ADL Comments: Pt agreeable to oob activity and performance at Mi nGuard A level with increased time     Vision   Vision Assessment?: No apparent visual deficits   Perception     Praxis      Cognition Arousal/Alertness: Lethargic;Suspect due to medications (Just recieved pain medication) Behavior During Therapy: WFL for tasks assessed/performed Overall Cognitive Status: Within Functional Limits for tasks assessed                                 General Comments: Pt reporting feeling woozy and a bit "loopy" after taking pain meds. Pt requiring mutiple breaks to  close eyes and "gather myself". Motivated        Exercises     Shoulder Instructions       General Comments Pt's sister arriving during session    Pertinent Vitals/ Pain       Pain Assessment: Faces Faces Pain Scale: Hurts even more Pain Location: abdomen; surgical site Pain Descriptors / Indicators: Discomfort;Grimacing;Guarding;Operative site guarding Pain Intervention(s): Monitored during session;Limited activity  within patient's tolerance;Repositioned  Home Living                                          Prior Functioning/Environment              Frequency  Min 2X/week        Progress Toward Goals  OT Goals(current goals can now be found in the care plan section)  Progress towards OT goals: Progressing toward goals  Acute Rehab OT Goals Patient Stated Goal: to get better OT Goal Formulation: With patient Time For Goal Achievement: 03/07/21 Potential to Achieve Goals: Good ADL Goals Pt Will Perform Grooming: with modified independence;standing Pt Will Perform Lower Body Bathing: with min assist;sitting/lateral leans;sit to/from stand;with adaptive equipment Pt Will Perform Lower Body Dressing: with min assist;with adaptive equipment;sitting/lateral leans;sit to/from stand Pt Will Transfer to Toilet: with supervision;ambulating Pt Will Perform Toileting - Clothing Manipulation and hygiene: with min assist;with adaptive equipment;sitting/lateral leans;sit to/from stand  Plan Discharge plan remains appropriate    Co-evaluation                 AM-PAC OT "6 Clicks" Daily Activity     Outcome Measure   Help from another person eating meals?: A Little Help from another person taking care of personal grooming?: A Little Help from another person toileting, which includes using toliet, bedpan, or urinal?: A Little Help from another person bathing (including washing, rinsing, drying)?: A Lot Help from another person to put on and taking off regular upper body clothing?: A Little Help from another person to put on and taking off regular lower body clothing?: A Lot 6 Click Score: 16    End of Session Equipment Utilized During Treatment: Gait belt;Rolling walker  OT Visit Diagnosis: Unsteadiness on feet (R26.81);Other abnormalities of gait and mobility (R26.89);Muscle weakness (generalized) (M62.81);Pain Pain - Right/Left:  (Abdomen) Pain - part of body:   (Abdomen)   Activity Tolerance Patient tolerated treatment well   Patient Left in bed;with call bell/phone within reach;with family/visitor present   Nurse Communication Mobility status        Time: 6270-3500 OT Time Calculation (min): 36 min  Charges: OT General Charges $OT Visit: 1 Visit OT Treatments $Therapeutic Activity: 23-37 mins  Leighann Amadon MSOT, OTR/L Acute Rehab Pager: [phone] Office: [phone]   Theodoro Grist Shereese Bonnie 02/22/2021, 2:50 PM

## 2021-02-23 LAB — COMPREHENSIVE METABOLIC PANEL
ALT: 76 U/L — ABNORMAL HIGH (ref 0–44)
AST: 32 U/L (ref 15–41)
Albumin: 2.7 g/dL — ABNORMAL LOW (ref 3.5–5.0)
Alkaline Phosphatase: 74 U/L (ref 38–126)
Anion gap: 8 (ref 5–15)
BUN: 9 mg/dL (ref 8–23)
CO2: 24 mmol/L (ref 22–32)
Calcium: 8.8 mg/dL — ABNORMAL LOW (ref 8.9–10.3)
Chloride: 107 mmol/L (ref 98–111)
Creatinine, Ser: 0.86 mg/dL (ref 0.44–1.00)
GFR, Estimated: >60 mL/min (ref >60)
Glucose, Bld: 116 mg/dL — ABNORMAL HIGH (ref 70–99)
Potassium: 3.7 mmol/L (ref 3.5–5.1)
Sodium: 139 mmol/L (ref 135–145)
Total Bilirubin: 0.9 mg/dL (ref 0.3–1.2)
Total Protein: 6 g/dL — ABNORMAL LOW (ref 6.5–8.1)

## 2021-02-23 MED ORDER — GABAPENTIN 600 MG PO TABS
300.0000 mg | ORAL_TABLET | Freq: Two times a day (BID) | ORAL | Status: DC
  Administered 2021-02-23 – 2021-02-25 (×5): 300 mg via ORAL
  Filled 2021-02-23 (×5): qty 1

## 2021-02-23 MED ORDER — TORSEMIDE 20 MG PO TABS
30.0000 mg | ORAL_TABLET | Freq: Every day | ORAL | Status: DC
  Administered 2021-02-23 – 2021-02-25 (×3): 30 mg via ORAL
  Filled 2021-02-23 (×4): qty 1

## 2021-02-23 MED ORDER — ENSURE ENLIVE PO LIQD
237.0000 mL | Freq: Two times a day (BID) | ORAL | Status: DC
  Administered 2021-02-23 – 2021-02-24 (×2): 237 mL via ORAL

## 2021-02-23 MED ORDER — METHOCARBAMOL 750 MG PO TABS
750.0000 mg | ORAL_TABLET | Freq: Four times a day (QID) | ORAL | Status: DC
  Administered 2021-02-23 – 2021-02-25 (×10): 750 mg via ORAL
  Filled 2021-02-23 (×10): qty 1

## 2021-02-23 NOTE — Progress Notes (Signed)
PT Cancellation Note  Patient Details Name: Jasmine Contreras MRN: [id number] DOB: 1951-01-18   Cancelled Treatment:    Reason Eval/Treat Not Completed: Patient declined, no reason specified.  Pt reports she wants to wait to walk with her husband when he gets here after 4pm.  She is agreeable for PT to check back later.   Thanks,  Corinna Capra, PT, DPT  Acute Rehabilitation Ortho Tech Supervisor [phone] pager #[phone] office      Rollene Rotunda Brielyn Bosak 02/23/2021, 2:32 PM

## 2021-02-23 NOTE — Progress Notes (Signed)
    4 Days Post-Op  Subjective: Remains stable. Having some difficulty with pain control. No nausea or vomiting. Tolerating PO. Passing flatus but no BM yet. Ambulated in hall yesterday. Labs unremarkable, creatinine at baseline.   Objective: Vital signs in last 24 hours: Temp:  [97.6 F (36.4 C)-99 F (37.2 C)] 97.6 F (36.4 C) (08/23 0700) Pulse Rate:  [60-85] 72 (08/23 0700) Resp:  [16-19] 16 (08/23 0700) BP: (131-150)/(63-78) 142/65 (08/23 0700) SpO2:  [93 %-96 %] 95 % (08/23 0700) Last BM Date: 02/19/21  Intake/Output from previous day: 08/22 0701 - 08/23 0700 In: 1080 [P.O.:1080] Out: -  Intake/Output this shift: No intake/output data recorded.  PE: General: resting comfortably, NAD Neuro: alert and oriented, no focal deficits Resp: normal work of breathing Abdomen: soft, nondistended, mildly tender. Midline incision clean and dry. Extremities: warm and well-perfused   Lab Results:  Recent Labs    02/21/21 0110  WBC 8.9  HGB 9.9*  HCT 31.2*  PLT 214   BMET Recent Labs    02/21/21 0632 02/23/21 0106  NA 141 139  K 3.6 3.7  CL 107 107  CO2 27 24  GLUCOSE 110* 116*  BUN 12 9  CREATININE 1.03* 0.86  CALCIUM 7.9* 8.8*   PT/INR No results for input(s): LABPROT, INR in the last 72 hours. CMP     Component Value Date/Time   NA 139 02/23/2021 0106   NA 139 08/01/2018 1303   K 3.7 02/23/2021 0106   CL 107 02/23/2021 0106   CO2 24 02/23/2021 0106   GLUCOSE 116 (H) 02/23/2021 0106   BUN 9 02/23/2021 0106   BUN 5 (L) 08/01/2018 1303   CREATININE 0.86 02/23/2021 0106   CALCIUM 8.8 (L) 02/23/2021 0106   PROT 6.0 (L) 02/23/2021 0106   PROT 7.2 08/01/2018 1303   ALBUMIN 2.7 (L) 02/23/2021 0106   ALBUMIN 4.4 08/01/2018 1303   AST 32 02/23/2021 0106   ALT 76 (H) 02/23/2021 0106   ALKPHOS 74 02/23/2021 0106   BILITOT 0.9 02/23/2021 0106   BILITOT 0.5 08/01/2018 1303   GFRNONAA >60 02/23/2021 0106   GFRAA >60 11/09/2019 0449   Lipase      Component Value Date/Time   LIPASE 25 11/15/2018 1312        Assessment/Plan 70 yo female with recurrent hepatic cysts, POD4 s/p open fenestration.  - Continue regular diet - Multimodal pain control: oxycodone, dilaudid, scheduled tylenol and robaxin. Increase robaxin dose today and add on low-dose gabapentin BID. - Ambulate in hall today, PT/OT following - Resume home torsemide - VTE: lovenox, SCDs - Dispo: med-surg floor. Anticipate discharge in 1-2 days pending improvement in pain control and mobility.    LOS: 4 days    Sophronia Simas, MD [facility] Surgery General, Hepatobiliary and Pancreatic Surgery 02/23/21 8:39 AM

## 2021-02-24 MED ORDER — BISACODYL 10 MG RE SUPP
10.0000 mg | Freq: Once | RECTAL | Status: DC
  Filled 2021-02-24: qty 1

## 2021-02-24 NOTE — Progress Notes (Signed)
Physical Therapy Treatment Patient Details Name: Jasmine Contreras MRN: [id number] DOB: 05/30/1951 Today's Date: 02/24/2021    History of Present Illness 70 y.o. female who presented 02/19/21 with recurrence of benign symptomatic hepatic cysts. S/p exploratory laparotomy, lysis of adhesions, and fenesatration of hepatic cysts 8/19. PMH: anemia, arthritis, asthma, depression, double vision, dysrhythmia, gout, heart murmur, hepatitis, herpes, HTN, IBS, MVP, TIA, tinnitus, vertigo    PT Comments    Pt supine in bed.  Focused on stair training and progression of gt training.  Pt improving but posture limited due to pain.     Follow Up Recommendations  No PT follow up;Supervision for mobility/OOB     Equipment Recommendations  Rolling walker with 5" wheels;Other (comment) (shower chair.)    Recommendations for Other Services       Precautions / Restrictions Precautions Precautions: Fall Restrictions Weight Bearing Restrictions: No    Mobility  Bed Mobility Overal bed mobility: Modified Independent                  Transfers Overall transfer level: Modified independent Equipment used: Rolling walker (2 wheeled) Transfers: Sit to/from Stand              Ambulation/Gait Ambulation/Gait assistance: Supervision Gait Distance (Feet): 150 Feet Assistive device: Rolling walker (2 wheeled) Gait Pattern/deviations: Trunk flexed Gait velocity: reduced   General Gait Details: Cues for erect stance.  remains flexed due to pain in abdomen.   Stairs Stairs: Yes Stairs assistance: Supervision Stair Management: Two rails Number of Stairs: 1 General stair comments: Cues for sequencing and safety.  Holding to B rails to simulate holding door frame.   Wheelchair Mobility    Modified Rankin (Stroke Patients Only)       Balance Overall balance assessment: Mild deficits observed, not formally tested Sitting-balance support: Feet supported;No upper extremity  supported Sitting balance-Leahy Scale: Fair       Standing balance-Leahy Scale: Fair                              Cognition Arousal/Alertness: Awake/alert Behavior During Therapy: WFL for tasks assessed/performed;Flat affect Overall Cognitive Status: Within Functional Limits for tasks assessed                                        Exercises      General Comments        Pertinent Vitals/Pain Pain Assessment: Faces Faces Pain Scale: Hurts little more Pain Location: abdomen; surgical site Pain Descriptors / Indicators: Discomfort;Grimacing;Guarding;Operative site guarding Pain Intervention(s): Monitored during session;Repositioned    Home Living                      Prior Function            PT Goals (current goals can now be found in the care plan section) Acute Rehab PT Goals Patient Stated Goal: to get better Potential to Achieve Goals: Good Progress towards PT goals: Progressing toward goals    Frequency    Min 3X/week      PT Plan Current plan remains appropriate    Co-evaluation              AM-PAC PT "6 Clicks" Mobility   Outcome Measure  Help needed turning from your back to your side while in a flat bed without using  bedrails?: A Little Help needed moving from lying on your back to sitting on the side of a flat bed without using bedrails?: A Little Help needed moving to and from a bed to a chair (including a wheelchair)?: A Little Help needed standing up from a chair using your arms (e.g., wheelchair or bedside chair)?: A Little Help needed to walk in hospital room?: A Little Help needed climbing 3-5 steps with a railing? : A Little 6 Click Score: 18    End of Session   Activity Tolerance: Patient limited by fatigue (presents with DOE.) Patient left: in bed;with call bell/phone within reach;with family/visitor present (seated edge of bed.) Nurse Communication: Mobility status PT Visit Diagnosis:  Unsteadiness on feet (R26.81);Other abnormalities of gait and mobility (R26.89);Muscle weakness (generalized) (M62.81);Difficulty in walking, not elsewhere classified (R26.2);Pain Pain - part of body:  (abdomen)     Time: 6503-5465 PT Time Calculation (min) (ACUTE ONLY): 23 min  Charges:  $Gait Training: 23-37 mins                     Bonney Leitz , PTA Acute Rehabilitation Services Pager [phone] Office [phone]    Loriann Bosserman Artis Delay 02/24/2021, 5:45 PM

## 2021-02-24 NOTE — Progress Notes (Signed)
    5 Days Post-Op  Subjective: Remains stable. No acute changes. Tolerating diet, ambulated yesterday. No bowel movement.   Objective: Vital signs in last 24 hours: Temp:  [97.6 F (36.4 C)-98.5 F (36.9 C)] 98 F (36.7 C) (08/24 0403) Pulse Rate:  [60-72] 63 (08/24 0403) Resp:  [14-19] 16 (08/24 0403) BP: (122-156)/(62-82) 122/66 (08/24 0403) SpO2:  [95 %-100 %] 100 % (08/24 0403) Last BM Date: 02/19/21  Intake/Output from previous day: 08/23 0701 - 08/24 0700 In: 580 [P.O.:580] Out: 1600 [Urine:1600] Intake/Output this shift: Total I/O In: 480 [P.O.:480] Out: 1500 [Urine:1500]  PE: General: resting comfortably, NAD Neuro: alert and oriented, no focal deficits Resp: normal work of breathing Abdomen: soft, minimally distended, nontender. Midline incision clean and dry. Extremities: warm and well-perfused   Lab Results:  No results for input(s): WBC, HGB, HCT, PLT in the last 72 hours.  BMET Recent Labs    02/23/21 0106  NA 139  K 3.7  CL 107  CO2 24  GLUCOSE 116*  BUN 9  CREATININE 0.86  CALCIUM 8.8*   PT/INR No results for input(s): LABPROT, INR in the last 72 hours. CMP     Component Value Date/Time   NA 139 02/23/2021 0106   NA 139 08/01/2018 1303   K 3.7 02/23/2021 0106   CL 107 02/23/2021 0106   CO2 24 02/23/2021 0106   GLUCOSE 116 (H) 02/23/2021 0106   BUN 9 02/23/2021 0106   BUN 5 (L) 08/01/2018 1303   CREATININE 0.86 02/23/2021 0106   CALCIUM 8.8 (L) 02/23/2021 0106   PROT 6.0 (L) 02/23/2021 0106   PROT 7.2 08/01/2018 1303   ALBUMIN 2.7 (L) 02/23/2021 0106   ALBUMIN 4.4 08/01/2018 1303   AST 32 02/23/2021 0106   ALT 76 (H) 02/23/2021 0106   ALKPHOS 74 02/23/2021 0106   BILITOT 0.9 02/23/2021 0106   BILITOT 0.5 08/01/2018 1303   GFRNONAA >60 02/23/2021 0106   GFRAA >60 11/09/2019 0449   Lipase     Component Value Date/Time   LIPASE 25 11/15/2018 1312        Assessment/Plan 70 yo female with recurrent hepatic cysts, POD5  s/p open fenestration.  - Continue regular diet - Multimodal pain control: oxycodone, dilaudid, scheduled tylenol, robaxin and gabapentin. - Ambulate, PT/OT following - Stools softeners, give dulcolax suppository today - VTE: lovenox, SCDs - Dispo: med-surg floor. Tentative discharge tomorrow.   LOS: 5 days    Sophronia Simas, MD [facility] Surgery General, Hepatobiliary and Pancreatic Surgery 02/24/21 6:59 AM

## 2021-02-24 NOTE — Progress Notes (Signed)
Occupational Therapy Treatment Patient Details Name: Jasmine Contreras MRN: [id number] DOB: 12/02/1950 Today's Date: 02/24/2021    History of present illness 70 y.o. female who presented 02/19/21 with recurrence of benign symptomatic hepatic cysts. S/p exploratory laparotomy, lysis of adhesions, and fenesatration of hepatic cysts 8/19. PMH: anemia, arthritis, asthma, depression, double vision, dysrhythmia, gout, heart murmur, hepatitis, herpes, HTN, IBS, MVP, TIA, tinnitus, vertigo   OT comments  Pt doing well this session, progressing with OT goals. Pt was able to complete toileting, Pericare, and hygiene in the bathroom, with no difficulties, supervision for safety. Pt them ambulated a short distance in the hallway. Pt continues to be limited by pain and decreased activity tolerance. OT will continue to follow acutely.    Follow Up Recommendations  No OT follow up;Supervision - Intermittent    Equipment Recommendations  Tub/shower seat;Other (comment) (RW, [place] bariatric if available)    Recommendations for Other Services      Precautions / Restrictions Precautions Precautions: Fall Restrictions Weight Bearing Restrictions: No       Mobility Bed Mobility Overal bed mobility: Modified Independent Bed Mobility: Supine to Sit     Supine to sit: Modified independent (Device/Increase time);HOB elevated     General bed mobility comments: HOB elevated and use of rails, moving slowly    Transfers Overall transfer level: Needs assistance Equipment used: Rolling walker (2 wheeled) Transfers: Sit to/from Stand Sit to Stand: Supervision         General transfer comment: Supervision for safety    Balance Overall balance assessment: Mild deficits observed, not formally tested                                         ADL either performed or assessed with clinical judgement   ADL Overall ADL's : Needs assistance/impaired Eating/Feeding:  Independent;Sitting Eating/Feeding Details (indicate cue type and reason): fixing up her tray for lunch at end of session Grooming: Wash/dry hands;Wash/dry face;Supervision/safety;Standing Grooming Details (indicate cue type and reason): completed at sink                 Toilet Transfer: Supervision/safety;Ambulation Toilet Transfer Details (indicate cue type and reason): Sup ambulating into bathroom for safety Toileting- Clothing Manipulation and Hygiene: Supervision/safety;Sitting/lateral lean;Sit to/from stand Toileting - Clothing Manipulation Details (indicate cue type and reason): completed from toilet, sup for safety     Functional mobility during ADLs: Supervision/safety;Rolling walker General ADL Comments: Pt completing toileting and OOB mobility this session. Requiring supervision for safety     Vision   Vision Assessment?: No apparent visual deficits   Perception     Praxis      Cognition Arousal/Alertness: Awake/alert Behavior During Therapy: WFL for tasks assessed/performed Overall Cognitive Status: Within Functional Limits for tasks assessed                                          Exercises     Shoulder Instructions       General Comments VSS on RA    Pertinent Vitals/ Pain       Pain Assessment: Faces Faces Pain Scale: Hurts little more Pain Location: abdomen; surgical site Pain Descriptors / Indicators: Discomfort;Grimacing;Guarding;Operative site guarding Pain Intervention(s): Monitored during session;Repositioned  Home Living  Prior Functioning/Environment              Frequency  Min 2X/week        Progress Toward Goals  OT Goals(current goals can now be found in the care plan section)  Progress towards OT goals: Progressing toward goals  Acute Rehab OT Goals Patient Stated Goal: to get better OT Goal Formulation: With patient Time For Goal  Achievement: 03/07/21 Potential to Achieve Goals: Good ADL Goals Pt Will Perform Grooming: with modified independence;standing Pt Will Perform Lower Body Bathing: with min assist;sitting/lateral leans;sit to/from stand;with adaptive equipment Pt Will Perform Lower Body Dressing: with min assist;with adaptive equipment;sitting/lateral leans;sit to/from stand Pt Will Transfer to Toilet: with supervision;ambulating Pt Will Perform Toileting - Clothing Manipulation and hygiene: with min assist;with adaptive equipment;sitting/lateral leans;sit to/from stand  Plan Discharge plan remains appropriate    Co-evaluation                 AM-PAC OT "6 Clicks" Daily Activity     Outcome Measure   Help from another person eating meals?: None Help from another person taking care of personal grooming?: A Little Help from another person toileting, which includes using toliet, bedpan, or urinal?: A Little Help from another person bathing (including washing, rinsing, drying)?: A Little Help from another person to put on and taking off regular upper body clothing?: A Little Help from another person to put on and taking off regular lower body clothing?: A Little 6 Click Score: 19    End of Session Equipment Utilized During Treatment: Gait belt;Rolling walker  OT Visit Diagnosis: Unsteadiness on feet (R26.81);Other abnormalities of gait and mobility (R26.89);Muscle weakness (generalized) (M62.81);Pain Pain - Right/Left:  (Abdomen) Pain - part of body:  (Abdomen)   Activity Tolerance Patient tolerated treatment well   Patient Left in bed;with call bell/phone within reach;with family/visitor present   Nurse Communication Mobility status        Time: 3818-2993 OT Time Calculation (min): 24 min  Charges: OT General Charges $OT Visit: 1 Visit OT Treatments $Self Care/Home Management : 8-22 mins $Therapeutic Activity: 8-22 mins  Arbutus Nelligan H., OTR/L Acute Rehabilitation  Elanna Bert Elane  Eyal Greenhaw 02/24/2021, 1:08 PM

## 2021-02-25 MED ORDER — OXYCODONE HCL 5 MG PO TABS: 5.0000 mg | ORAL_TABLET | ORAL | 0 refills | Status: DC | PRN

## 2021-02-25 MED ORDER — METHOCARBAMOL 750 MG PO TABS: 750.0000 mg | ORAL_TABLET | Freq: Four times a day (QID) | ORAL | 0 refills | Status: AC | PRN

## 2021-02-25 MED ORDER — MAGNESIUM HYDROXIDE 400 MG/5ML PO SUSP
15.0000 mL | Freq: Once | ORAL | Status: AC
  Administered 2021-02-25: 15 mL via ORAL
  Filled 2021-02-25: qty 30

## 2021-02-25 MED ORDER — GABAPENTIN 50 MG PO TABS: 300.0000 mg | ORAL_TABLET | Freq: Two times a day (BID) | ORAL | 0 refills | Status: DC

## 2021-02-25 NOTE — Discharge Instructions (Addendum)
[facility] [place] [facility] DISCHARGE INSTRUCTIONS  Activity No heavy lifting greater than 15 pounds for 6 weeks after surgery. Ok to shower, but do not bathe or submerge incisions underwater. Do not drive while taking narcotic pain medication.  Wound Care Your incision is covered with skin glue called Dermabond. This will peel off on its own over time. You may shower and allow warm soapy water to run over your incisions. Gently pat dry. Do not submerge your incision underwater. Monitor your incision for any new redness, tenderness, or drainage.  When to Call us: Fever greater than 100.5 New redness, drainage, or swelling at incision site Severe pain, nausea, or vomiting Jaundice (yellowing of the whites of the eyes or skin)  Follow-up You have an appointment scheduled with Dr. Freida Busman on March 16, 2021 at 10:45am. This will be at the [facility] Surgery office at [address], [place], [place]. Please arrive at least 15 minutes prior to your scheduled appointment time.  For questions or concerns, please call the office at [phone].

## 2021-02-25 NOTE — Progress Notes (Signed)
PT Cancellation Note  Patient Details Name: Jasmine Contreras MRN: [id number] DOB: 1950/08/03   Cancelled Treatment:    Reason Eval/Treat Not Completed: Patient declined, no reason specified. Pt reporting she is going home today and does not need to practice any functional mobility at this time. Reviewed techniques for reducing pain with bed mobility per pt request, but otherwise pt reports feeling like she is prepared to return home today without an additional PT session at this time. Will hold today per pt request.   Raymond Gurney, PT, DPT Acute Rehabilitation Services  Pager: [phone] Office: [phone]    Jewel Baize 02/25/2021, 1:45 PM

## 2021-02-25 NOTE — Progress Notes (Signed)
Discharge teaching given to patient. Pt verbalized understanding of all teaching. Walker delivered from [employer] and given to pt now to take home with her. IV out. Patient discharged to home with husband via wheelchair with all belongings.

## 2021-02-25 NOTE — TOC Transition Note (Signed)
Transition of Care [facility]) - CM/SW Discharge Note   Patient Details  Name: Jasmine Contreras MRN: [id number] Date of Birth: [birth year]  Transition of Care [facility]) CM/SW Contact:  Leone Haven, RN Phone Number: 02/25/2021, 3:32 PM   Clinical Narrative:    Patient is for dc today, she will need a rolling walker and she is ok with [employer] supplying this for her.  NCM made referral to [facility] with [employer] for rolling walker, this will be brought up to her room prior to dc.   She will get the shower chair from [place]. She has transport home.   Final next level of care: Home/Self Care Barriers to Discharge: No Barriers Identified   Patient Goals and CMS Choice Patient states their goals for this hospitalization and ongoing recovery are:: return home      Discharge Placement                       Discharge Plan and Services                DME Arranged: Walker rolling DME Agency: [employer] Date DME Agency Contacted: 02/25/21 Time DME Agency Contacted: [phone] Representative spoke with at DME Agency: Silvio Pate            Social Determinants of Health (SDOH) Interventions     Readmission Risk Interventions No flowsheet data found.

## 2021-02-25 NOTE — Progress Notes (Signed)
    6 Days Post-Op  Subjective: Remains stable. No acute changes. Ambulated with PT/OT yesterday. Continues to pass flatus but no bowel movement.    Objective: Vital signs in last 24 hours: Temp:  [98.2 F (36.8 C)-98.6 F (37 C)] 98.2 F (36.8 C) (08/25 0441) Pulse Rate:  [57-79] 70 (08/25 0441) Resp:  [16-19] 16 (08/25 0441) BP: (123-136)/(65-77) 136/65 (08/25 0441) SpO2:  [95 %-100 %] 95 % (08/25 0441) Last BM Date: 02/19/21  Intake/Output from previous day: 08/24 0701 - 08/25 0700 In: 780 [P.O.:780] Out: 1000 [Urine:1000] Intake/Output this shift: No intake/output data recorded.  PE: General: resting comfortably, NAD Neuro: alert and oriented, no focal deficits Resp: normal work of breathing Abdomen: soft, minimally distended, nontender. Midline incision clean and dry. Extremities: warm and well-perfused   Lab Results:  No results for input(s): WBC, HGB, HCT, PLT in the last 72 hours.  BMET Recent Labs    02/23/21 0106  NA 139  K 3.7  CL 107  CO2 24  GLUCOSE 116*  BUN 9  CREATININE 0.86  CALCIUM 8.8*   PT/INR No results for input(s): LABPROT, INR in the last 72 hours. CMP     Component Value Date/Time   NA 139 02/23/2021 0106   NA 139 08/01/2018 1303   K 3.7 02/23/2021 0106   CL 107 02/23/2021 0106   CO2 24 02/23/2021 0106   GLUCOSE 116 (H) 02/23/2021 0106   BUN 9 02/23/2021 0106   BUN 5 (L) 08/01/2018 1303   CREATININE 0.86 02/23/2021 0106   CALCIUM 8.8 (L) 02/23/2021 0106   PROT 6.0 (L) 02/23/2021 0106   PROT 7.2 08/01/2018 1303   ALBUMIN 2.7 (L) 02/23/2021 0106   ALBUMIN 4.4 08/01/2018 1303   AST 32 02/23/2021 0106   ALT 76 (H) 02/23/2021 0106   ALKPHOS 74 02/23/2021 0106   BILITOT 0.9 02/23/2021 0106   BILITOT 0.5 08/01/2018 1303   GFRNONAA >60 02/23/2021 0106   GFRAA >60 11/09/2019 0449   Lipase     Component Value Date/Time   LIPASE 25 11/15/2018 1312        Assessment/Plan 70 yo female with recurrent hepatic cysts,  POD6 s/p open fenestration.  - Continue regular diet - Multimodal pain control: oxycodone, dilaudid, scheduled tylenol, robaxin and gabapentin. - Ambulate, PT/OT following - Milk of magnesia today. Continue colace. - VTE: lovenox, SCDs - Dispo: med-surg floor. Possible discharge home this afternoon vs tomorrow morning.   LOS: 6 days    Sophronia Simas, MD [facility] Surgery General, Hepatobiliary and Pancreatic Surgery 02/25/21 8:34 AM

## 2021-03-02 LAB — TYPE AND SCREEN
ABO/RH(D): O POS
Antibody Screen: NEGATIVE
Unit division: 0
Unit division: 0
Unit division: 0
Unit division: 0

## 2021-03-02 LAB — BPAM RBC
Blood Product Expiration Date: 202209202359
Blood Product Expiration Date: 202209202359
Blood Product Expiration Date: 202209202359
Blood Product Expiration Date: 202209202359
ISSUE DATE / TIME: 202208191343
ISSUE DATE / TIME: 202208191540
Unit Type and Rh: 5100
Unit Type and Rh: 5100
Unit Type and Rh: 5100
Unit Type and Rh: 5100

## 2021-03-03 NOTE — Discharge Summary (Signed)
Physician Discharge Summary   Patient ID: Jasmine Contreras [id number] 70 y.o. 06/15/1951  Admit date: 02/19/2021  Discharge date and time: 02/25/2021  5:56 PM   Admitting Physician: Fritzi Mandes, MD   Discharge Physician: Sophronia Simas  Admission Diagnoses: Hepatic cyst [K76.89]  Discharge Diagnoses: Hepatic cysts  Admission Condition: good  Discharged Condition: good  Indication for Admission: Ms. Scahill is a 70 yo female with recurrent hepatic cysts who has previously undergone laparoscopic fenestration, after which her symptoms resolved. She has developed recurrent symptoms as well as early satiety, with imaging showing reaccumulation of the cysts. She has agreed to undergo open adhesiolysis and fenestration.  Hospital Course: The patient was taken to the OR on 02/19/21 for exploratory laparotomy, lysis of adhesions, and fenestration of bilobar hepatic cysts. For further details of this procedure please see separately dictated operative note. Postoperatively the patient was admitted to the progressive care unit in stable condition. Foley was removed on POD1 and she was started on a clear liquid diet. She was transferred to a med-surg floor. LFTs downtrending appropriately. She was advanced to a regular diet on POD2, which she tolerated. Over the next few days she worked with physical therapy and was able to ambulate. She was transitioned off the PCA to oral medications. She had return of bowel function. On the morning of POD6 she was tolerating a diet, ambulating, and pain was controlled with oral medications. She was examined and deemed appropriate for discharge home.  Consults: None  Significant Diagnostic Studies: N/A  Treatments: analgesia: acetaminophen and oxycodone, therapies: PT and OT, and surgery: open fenestration of hepatic cysts  Discharge Exam: BP 110/62 (BP Location: Left Arm)   Pulse 67   Temp 98.3 F (36.8 C) (Oral)   Resp 18   Ht 5\' 9"  (1.753 m)   Wt 120.7  kg   SpO2 97%   BMI 39.28 kg/m   General: resting comfortably, NAD Neuro: alert and oriented, no focal deficits Resp: normal work of breathing on room air CV: RRR Abdomen: soft, nondistended, nontender to palpation. Midline incision clean and dry with no erythema, induration or drainage. Extremities: warm and well-perfused   Disposition: Discharge disposition: 01-Home or Self Care       Patient Instructions:  Allergies as of 02/25/2021       Reactions   Contrast Media [iodinated Diagnostic Agents] Itching   Honey Bee Treatment [bee Venom] Anaphylaxis, Swelling   Body swells   Other Other (See Comments)   Has diverticulitis- cannot have ANY FOODS WITH SEEDS!! Tomato   Peanuts [peanut Oil] Hives   peanuts   Shellfish-derived Products Anaphylaxis   Strawberry Extract Anaphylaxis   Chlorhexidine Itching   Abilify [aripiprazole] Other (See Comments)   Drowsiness   Adhesive [tape] Hives, Itching   Barium    Patient allergic to preservative in redi cat per Dr. 02/27/2021 patient should not have redi cat oral //rls   Codeine Itching, Nausea And Vomiting   Hydrocodone Itching, Nausea And Vomiting   Latex Hives, Itching   Meclizine Hcl Nausea Only   Oxycodone Hcl Itching, Nausea And Vomiting   Prednisone Other (See Comments), Hypertension   "Makes my face turn red, also"   Scopace [scopolamine] Nausea Only   Sertraline Other (See Comments)   Suicidal thoughts   Tavist-d [albertsons Dayhist-d] Other (See Comments)   Makes my nose drain   Tramadol Nausea Only        Medication List     STOP taking these medications  predniSONE 20 MG tablet Commonly known as: DELTASONE       TAKE these medications    acetaminophen 325 MG tablet Commonly known as: TYLENOL Take 325-650 mg by mouth every 6 (six) hours as needed (for pain or headaches).   Biotin 5 MG Tabs Take 5 mg by mouth daily.   carboxymethylcellulose 0.5 % Soln Commonly known as: REFRESH PLUS Place 1  drop into both eyes daily as needed (Dry eye).   colchicine 0.6 MG tablet Take 0.6 mg by mouth daily as needed (as directed for gout flares).   docusate sodium 100 MG capsule Commonly known as: COLACE Take 1 capsule (100 mg total) by mouth 2 (two) times daily.   EPINEPHrine 0.3 mg/0.3 mL Soaj injection Commonly known as: EPI-PEN Inject 0.3 mg into the muscle once as needed for anaphylaxis.   ferrous gluconate 240 (27 FE) MG tablet Commonly known as: FERGON Take 240 mg by mouth daily.   Gabapentin 50 MG Tabs Take 300 mg by mouth 2 (two) times daily for 5 days.   indomethacin 50 MG capsule Commonly known as: INDOCIN Take 50 mg by mouth 2 (two) times daily as needed (as directed for pain/gout flares). With food or milk   levocetirizine 5 MG tablet Commonly known as: XYZAL Take 5 mg by mouth daily as needed for allergies.   methocarbamol 750 MG tablet Commonly known as: ROBAXIN Take 1 tablet (750 mg total) by mouth every 6 (six) hours as needed for muscle spasms.   metoprolol succinate 25 MG 24 hr tablet Commonly known as: TOPROL-XL Take 25 mg by mouth daily.   nitroGLYCERIN 0.4 MG SL tablet Commonly known as: NITROSTAT Place 0.4 mg under the tongue every 5 (five) minutes as needed for chest pain.   OVER THE COUNTER MEDICATION Take 400 mg by mouth daily. DDR prime oil   OVER THE COUNTER MEDICATION Apply 1 application topically daily as needed (rub on stomach and feet for arthritis and nausea). Peppermint oil frankincense oil lavender oil Ginger oil   oxyCODONE 5 MG immediate release tablet Commonly known as: Oxy IR/ROXICODONE Take 1 tablet (5 mg total) by mouth every 4 (four) hours as needed for severe pain.   pantoprazole 40 MG tablet Commonly known as: Protonix Take 30- 60 min before your first and last meals of the day What changed:  how much to take how to take this when to take this additional instructions   potassium chloride SA 20 MEQ tablet Commonly  known as: KLOR-CON Take 40 mEq by mouth 2 (two) times daily.   sucralfate 1 g tablet Commonly known as: CARAFATE Take 1 g by mouth 2 (two) times daily.   torsemide 20 MG tablet Commonly known as: DEMADEX Take 30 mg by mouth daily.   TURMERIC PO Take 300 mg by mouth daily.   VITAMIN B-12 PO Take 2,500 mcg by mouth daily.   vitamin C 1000 MG tablet Take 2,000 mg by mouth daily.   Vitamin D3 50 MCG (2000 UT) Tabs Take 2,000 Units by mouth daily.       Activity: no driving while on analgesics and no heavy lifting for 6 weeks Diet: regular diet Wound Care: keep wound clean and dry  Follow-up with Dr. Freida Busman in 3 weeks.  Signed: Fritzi Mandes 03/03/2021 8:21 AM

## 2021-03-11 IMAGING — CT CT ABD-PELV W/O CM
2 of 4 series · 14 of 46 positions shown, 16 images · non-contrast
Comparison: 11/15/2018

CLINICAL DATA: Follow-up hepatic cysts.

EXAM:
CT ABDOMEN AND PELVIS WITHOUT CONTRAST
TECHNIQUE: Multidetector CT imaging of the abdomen and pelvis was performed
following the standard protocol without IV contrast. No intravenous
contrast was administered because patient was having itching
following oral contrast administration.

[Series 2: routine abdomen pelvis without 5.00 br40 s3 axial · axial · non-contrast · 0.79mm/px · z∈[+1322,+1747]mm · 11 of 103 slices shown, 13 images]
[im 9/103  soft-tissue]
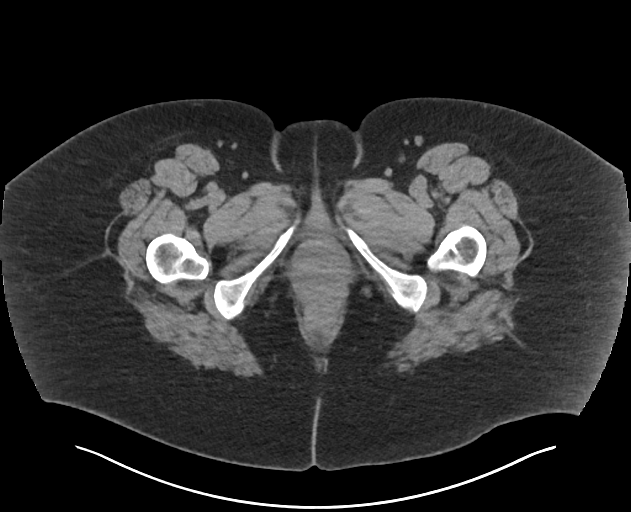
[im 9/103  bone]
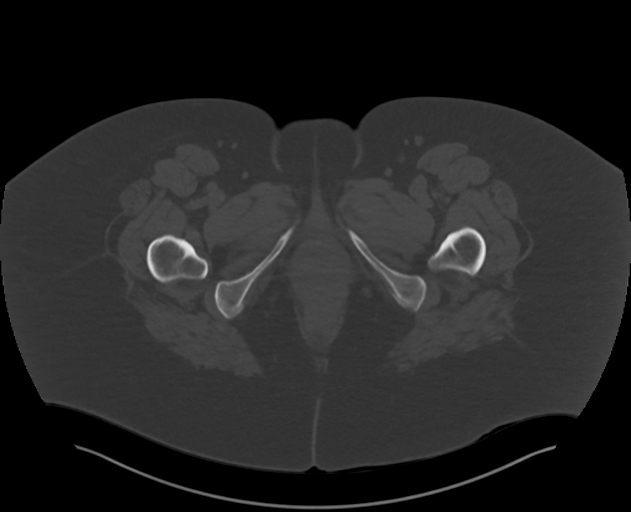
[im 17/103  soft-tissue]
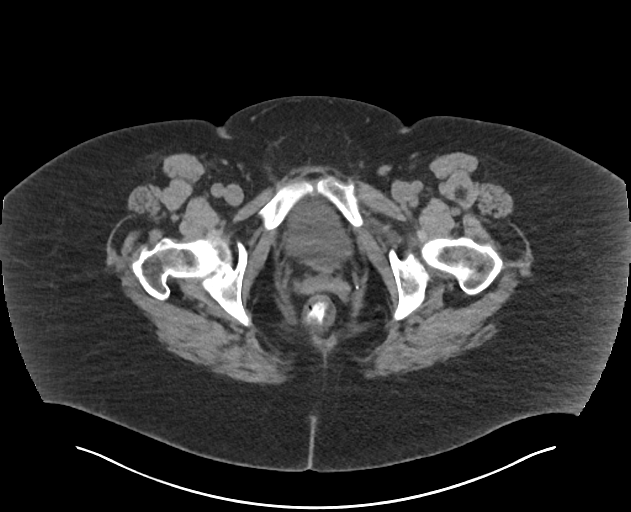
[im 25/103  soft-tissue]
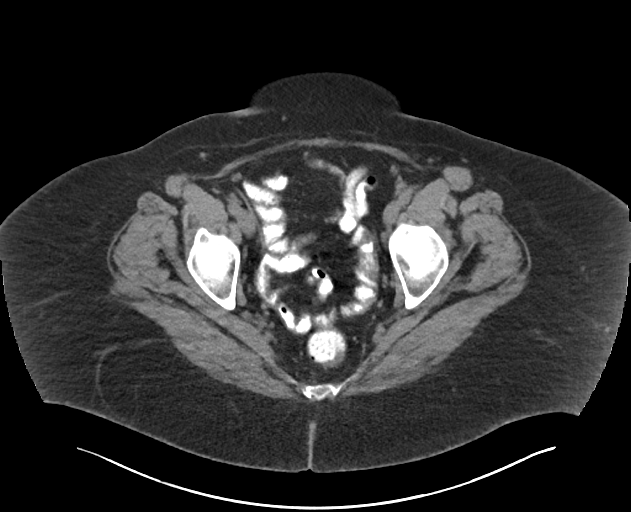
[im 33/103  soft-tissue]
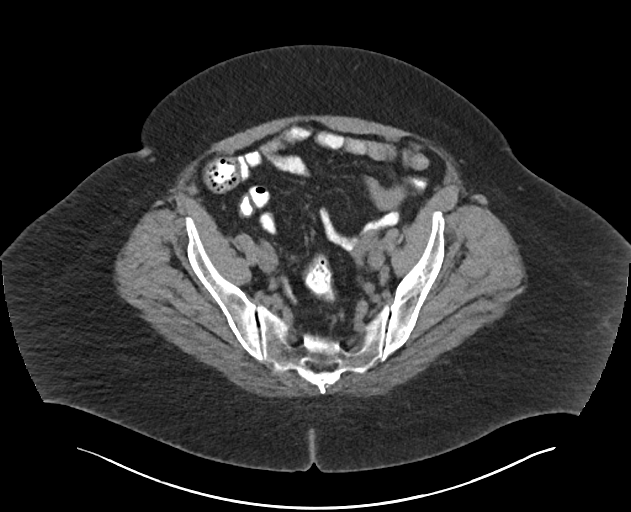
[im 41/103  soft-tissue]
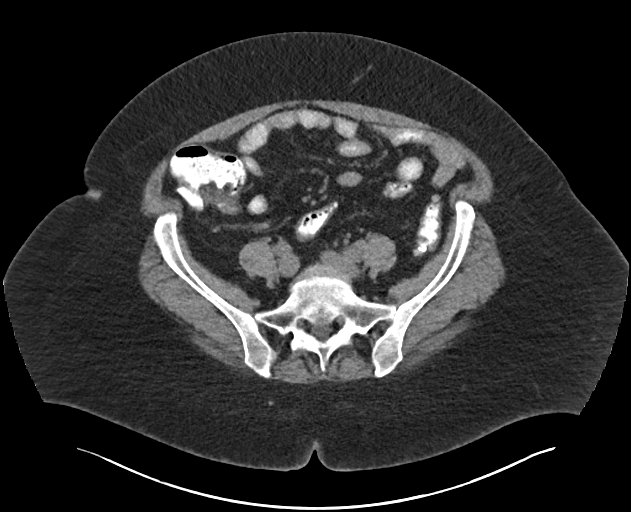
[im 54/103  soft-tissue]
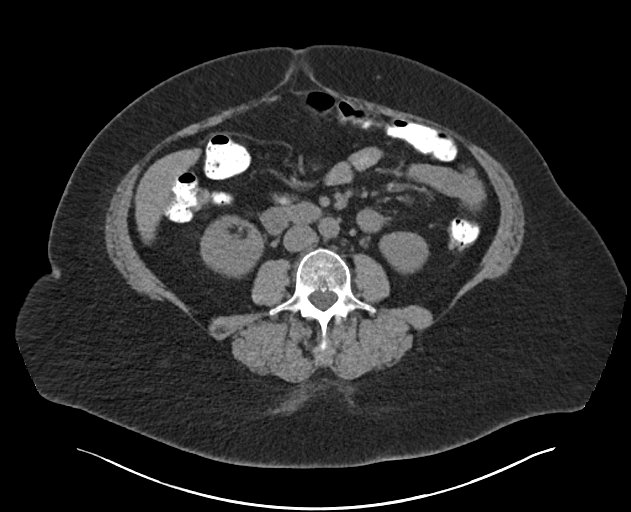
[im 62/103  soft-tissue]
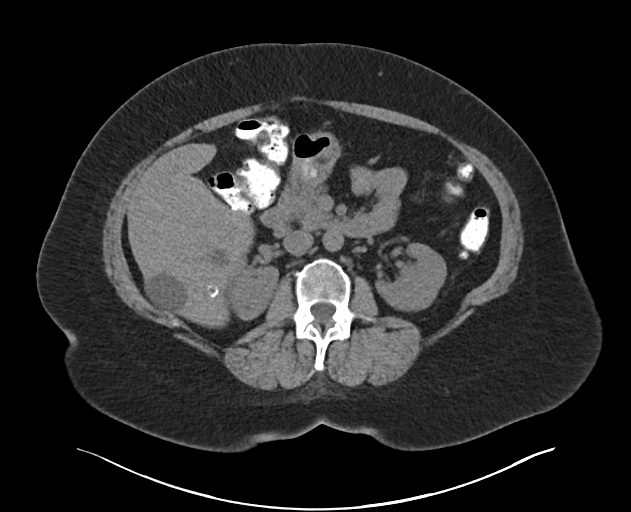
[im 70/103  soft-tissue]
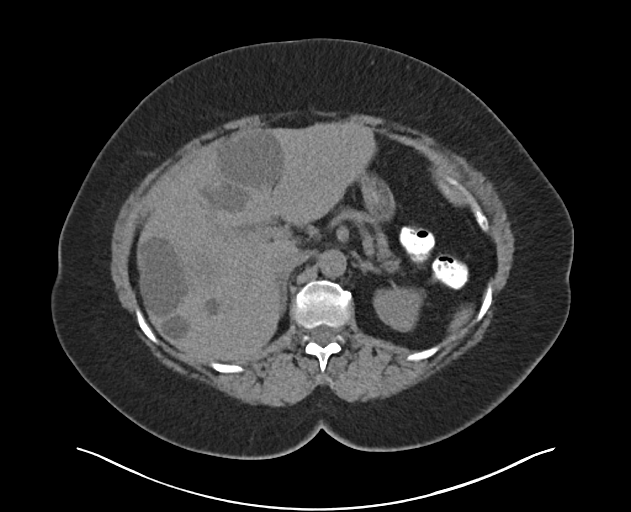
[im 78/103  soft-tissue]
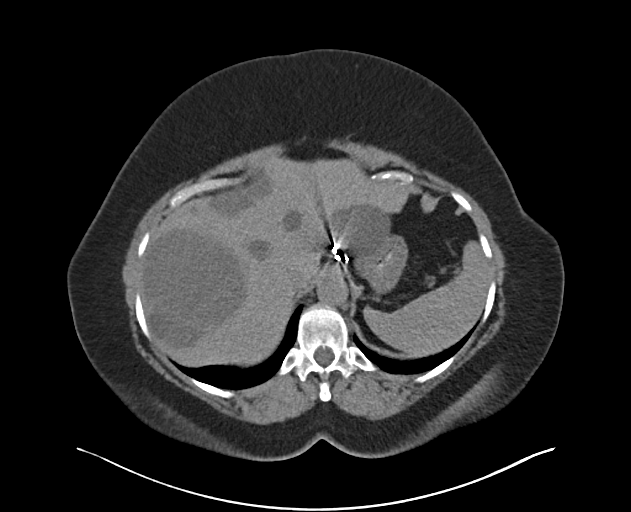
[im 78/103  bone]
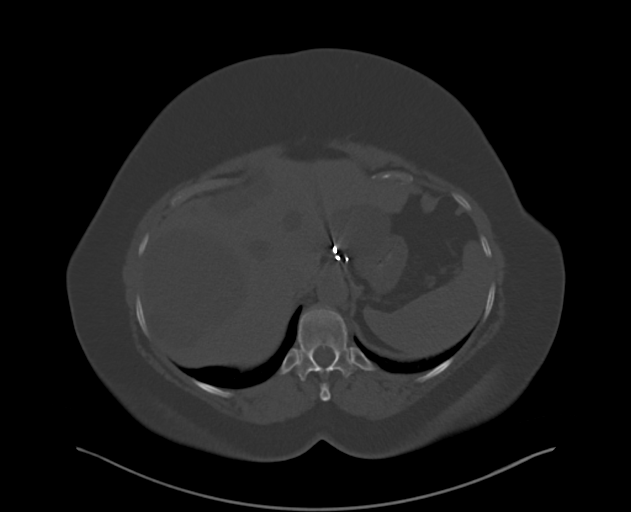
[im 86/103  soft-tissue]
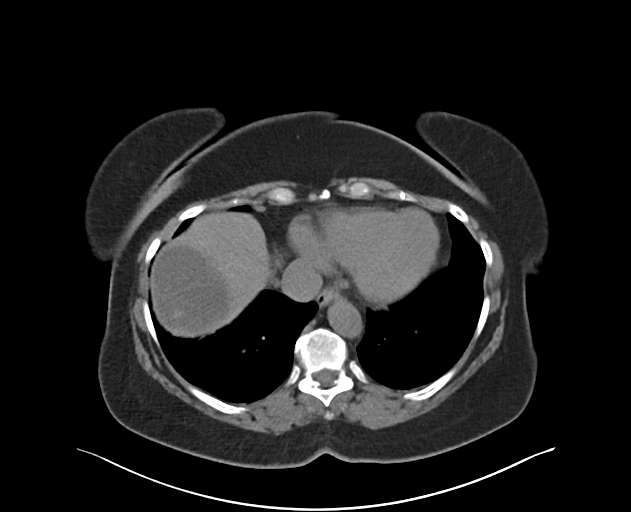
[im 94/103  soft-tissue]
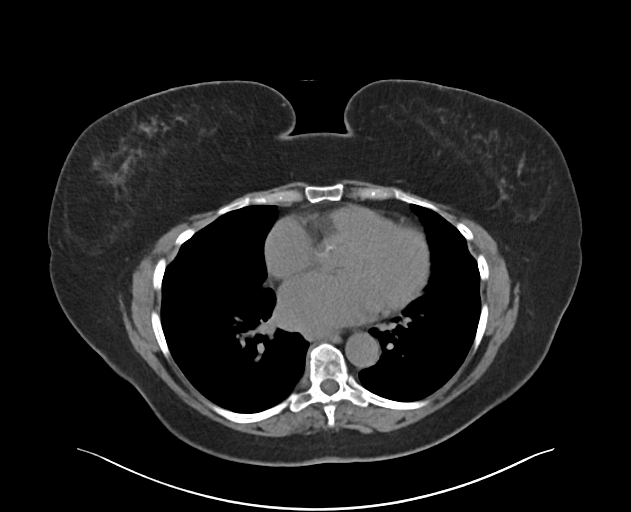

[Series 4: routine abdomen pelvis without 2.00 br40 s3 cor · coronal · non-contrast · 0.98mm/px · 3 of 202 slices shown]
[im 68/202  soft-tissue]
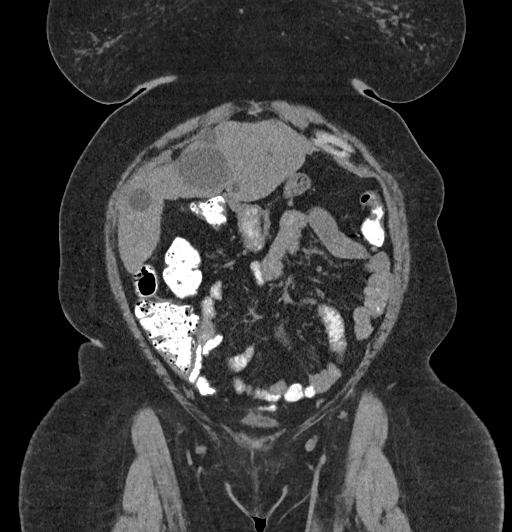
[im 90/202  soft-tissue]
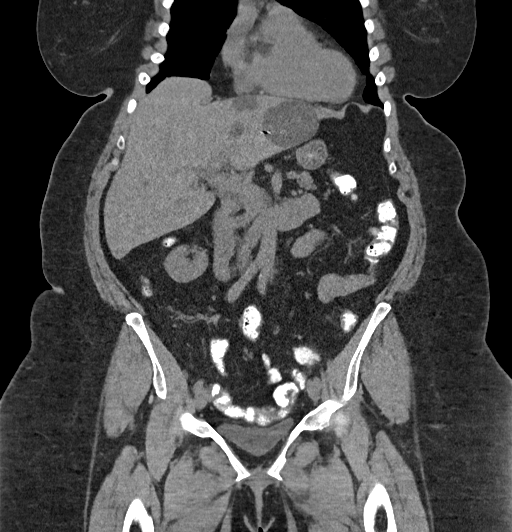
[im 112/202  soft-tissue]
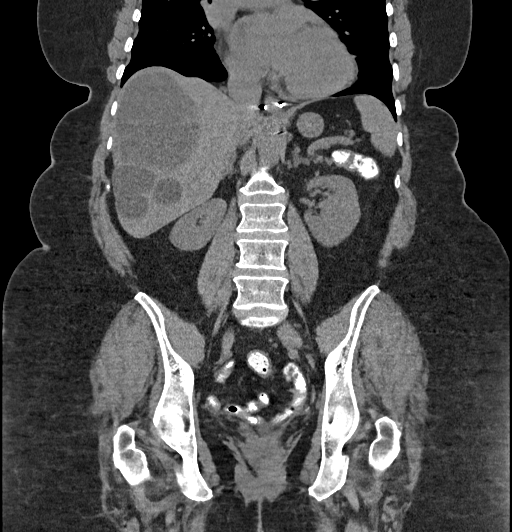

[14 of 46 positions shown; findings below may reference images not displayed]

FINDINGS: Lower chest: No acute findings.

Hepatobiliary: Multiple fluid attenuation cystic lesions are seen
throughout the right and left hepatic lobes and show no significant
change since previous study. The largest lesion is located in the
dome of the right hepatic lobe and contains a few thin internal
septations. This measures 9.3 x 8.0 cm, without significant change
since previous study. No suspicious hepatic lesions are seen on this
unenhanced exam. Prior cholecystectomy. No evidence of biliary
obstruction.

Pancreas: No mass or inflammatory process visualized on this
unenhanced exam.

Spleen:  Within normal limits in size.

Adrenals/Urinary tract: No evidence of urolithiasis or
hydronephrosis. Unremarkable unopacified urinary bladder.

Stomach/Bowel: Surgical clips noted in the region of the GE
junction. No evidence of hiatal hernia. No evidence of obstruction,
inflammatory process, or abnormal fluid collections. Diverticulosis
is seen mainly involving the descending and sigmoid colon, however
there is no evidence of diverticulitis.

Vascular/Lymphatic: No pathologically enlarged lymph nodes
identified. No evidence of abdominal aortic aneurysm.

Reproductive: Prior hysterectomy noted. Adnexal regions are
unremarkable in appearance.

Other:  None.

Musculoskeletal:  No suspicious bone lesions identified.
IMPRESSION: Multiple hepatic cysts, without significant change.

Colonic diverticulosis. No radiographic evidence of diverticulitis
or other acute findings.

## 2021-04-23 ENCOUNTER — Other Ambulatory Visit: Payer: Self-pay | Admitting: Family Medicine

## 2021-04-23 DIAGNOSIS — R1032 Left lower quadrant pain: Secondary | ICD-10-CM

## 2021-05-06 ENCOUNTER — Ambulatory Visit
Admission: RE | Admit: 2021-05-06 | Discharge: 2021-05-06 | Disposition: A | Discharge status: Home / Self Care | Payer: Medicare Other | Source: Ambulatory Visit | Attending: Family Medicine | Admitting: Family Medicine

## 2021-05-06 DIAGNOSIS — R1032 Left lower quadrant pain: Secondary | ICD-10-CM

## 2021-07-14 DIAGNOSIS — R1013 Epigastric pain: Secondary | ICD-10-CM | POA: Diagnosis not present

## 2021-07-14 DIAGNOSIS — K449 Diaphragmatic hernia without obstruction or gangrene: Secondary | ICD-10-CM | POA: Diagnosis not present

## 2021-07-14 DIAGNOSIS — Z9884 Bariatric surgery status: Secondary | ICD-10-CM | POA: Diagnosis not present

## 2021-07-15 DIAGNOSIS — H6123 Impacted cerumen, bilateral: Secondary | ICD-10-CM | POA: Diagnosis not present

## 2021-09-30 DIAGNOSIS — J301 Allergic rhinitis due to pollen: Secondary | ICD-10-CM | POA: Diagnosis not present

## 2021-09-30 DIAGNOSIS — Z79899 Other long term (current) drug therapy: Secondary | ICD-10-CM | POA: Diagnosis not present

## 2021-09-30 DIAGNOSIS — I491 Atrial premature depolarization: Secondary | ICD-10-CM | POA: Diagnosis not present

## 2021-09-30 DIAGNOSIS — R7303 Prediabetes: Secondary | ICD-10-CM | POA: Diagnosis not present

## 2021-09-30 DIAGNOSIS — K219 Gastro-esophageal reflux disease without esophagitis: Secondary | ICD-10-CM | POA: Diagnosis not present

## 2021-09-30 DIAGNOSIS — R42 Dizziness and giddiness: Secondary | ICD-10-CM | POA: Diagnosis not present

## 2021-09-30 DIAGNOSIS — R002 Palpitations: Secondary | ICD-10-CM | POA: Diagnosis not present

## 2021-09-30 DIAGNOSIS — R3915 Urgency of urination: Secondary | ICD-10-CM | POA: Diagnosis not present

## 2021-10-18 ENCOUNTER — Ambulatory Visit: Payer: Medicare Other | Admitting: Internal Medicine

## 2021-10-18 NOTE — Progress Notes (Deleted)
?Cardiology Office Note:   ? ?Date:  10/18/2021  ? ?ID:  Jasmine Contreras, DOB 11/30/1950, MRN [id number] ? ?PCP:  Laurann Montana, MD ?  ?[facility] Providers ?Cardiologist:  None { ?Click to update primary MD,subspecialty MD or APP then REFRESH:1}   ? ?Referring MD: Laurann Montana, MD  ? ?No chief complaint on file. ?*** ? ?History of Present Illness:   ? ?Jasmine Contreras is a 71 y.o. female with a hx of *** ? ?Past Medical History:  ?Diagnosis Date  ? Acid reflux   ? Anemia   ? Anxiety   ? situational  ? Arthritis   ? trochanteric bursitis  ? Asthma   ? years ago  ? Back pain   ? Chest pain   ? Complication of anesthesia   ? slow to wake up  ? Cough   ? Depression   ? situaltional  ? Diarrhea   ? Difficulty swallowing   ? Dizziness   ? Double vision   ? Dry mouth   ? Dysrhythmia   ? PAC's, PVC's  ? Fatigue   ? GERD (gastroesophageal reflux disease)   ? Gout   ? Headache   ? migraines ocassionally - stress related  ? Heart murmur   ? not heard by Dr. Renold Don in 2014.  Dr. Cliffton Asters did not hear  murmer at 05/15/20 appointment.  ? Hepatitis   ? cyst  ? Herpes   ? Hiatal hernia   ? Hypertension   ? Irritable bowel syndrome   ? Itching   ? Joint pain   ? MVP (mitral valve prolapse)   ? Neck stiffness   ? Palpitations   ? Pneumonia   ? PONV (postoperative nausea and vomiting)   ? PUD (peptic ulcer disease)   ? s/p vagotomy  ? Retaining fluid   ? Ringing in ears   ? Shortness of breath   ? exertion  ? Stress   ? Swelling of extremity   ? Swelling of finger   ? TIA (transient ischemic attack)   ? Tinnitus   ? Varicose veins   ? Vertigo   ? Vision changes   ? Vitamin D deficiency   ? Wheezing   ? ? ?Past Surgical History:  ?Procedure Laterality Date  ? APPENDECTOMY    ? BREAST EXCISIONAL BIOPSY Left   ? CARDIAC CATHETERIZATION    ? Twice, was found normal, no stents needed  ? CHOLECYSTECTOMY N/A 11/08/2019  ? Procedure: LAPAROSCOPIC TO DRAIN HEPATIC CYSTS AND UPPER ENDOSCOPY;  Surgeon: Luretha Murphy, MD;  Location: [facility]  ORS;  Service: General;  Laterality: N/A;  ? COLONOSCOPY    ? EYE SURGERY Bilateral   ? Lasik surgery  ? FOOT SURGERY Bilateral   ? hammer toes, in grown toemails  ? GALLBLADDER SURGERY    ? 06/09/20--- YEARS AGO- (OTHER GB SURGERY LISTED WAS ON LIVER CYST)  ? LAPAROSCOPIC LIVER CYST UNROOFING N/A 06/12/2020  ? Procedure: LAPAROSCOPIC FENESTRATION OF HEPATIC CYSTS;  Surgeon: Fritzi Mandes, MD;  Location: [facility] OR;  Service: General;  Laterality: N/A;  ? LASIK    ? OPEN PARTIAL HEPATECTOMY  N/A 02/19/2021  ? Procedure: OPEN FENESTRATION OF HEPATIC CYST WITH INTRAOPERATIVE ULTRASOUND;  Surgeon: Fritzi Mandes, MD;  Location: [facility] OR;  Service: General;  Laterality: N/A;  ? partial gastrectomy with vagotomy    ? right arthroscopic knee surgery Right   ? trochanteric bursectomy Bilateral   ? hips  ? VAGINAL HYSTERECTOMY    ?  with oophorectomy  ? ? ?Current Medications: ?No outpatient medications have been marked as taking for the 10/18/21 encounter (Appointment) with Maisie Fus, MD.  ?  ? ?Allergies:   Contrast media [iodinated contrast media], Honey bee treatment [bee venom], Other, Peanuts [peanut oil], Shellfish-derived products, Strawberry extract, Chlorhexidine, Abilify [aripiprazole], Adhesive [tape], Barium, Codeine, Hydrocodone, Latex, Meclizine hcl, Oxycodone hcl, Prednisone, Scopace [scopolamine], Sertraline, Tavist-d [albertsons dayhist-d], and Tramadol  ? ?Social History  ? ?Socioeconomic History  ? Marital status: Married  ?  Spouse name: Fayrene Fearing  ? Number of children: 3  ? Years of education: College  ? Highest education level: Not on file  ?Occupational History  ? Occupation: Retired   ?Tobacco Use  ? Smoking status: Never  ? Smokeless tobacco: Never  ?Vaping Use  ? Vaping Use: Former  ?Substance and Sexual Activity  ? Alcohol use: Yes  ?  Alcohol/week: 0.0 standard drinks  ?  Comment: 1 glass of wine with dinner- ocassional  ? Drug use: Not Currently  ?  Comment: cbd oils- 06/09/20- have not used in a while    ? Sexual activity: Not Currently  ?  Birth control/protection: Surgical  ?Other Topics Concern  ? Not on file  ?Social History Narrative  ? Patient drinks a pepsi or glass of tea in the morning.   ? ?Social Determinants of Health  ? ?Financial Resource Strain: Not on file  ?Food Insecurity: Not on file  ?Transportation Needs: Not on file  ?Physical Activity: Not on file  ?Stress: Not on file  ?Social Connections: Not on file  ?  ? ?Family History: ?The patient's ***family history includes Alcohol abuse in her father; Depression in her father; Diabetes in her mother; Hyperlipidemia in her father; Hypertension in her father and mother; Kidney disease in her mother; Sleep apnea in her father; Stroke in her father. ? ?ROS:   ?Please see the history of present illness.    ?*** All other systems reviewed and are negative. ? ?EKGs/Labs/Other Studies Reviewed:   ? ?The following studies were reviewed today: ?*** ? ?EKG:  EKG is *** ordered today.  The ekg ordered today demonstrates *** ? ?Recent Labs: ?02/21/2021: Hemoglobin 9.9; Platelets 214 ?02/23/2021: ALT 76; BUN 9; Creatinine, Ser 0.86; Potassium 3.7; Sodium 139  ?Recent Lipid Panel ?   ?Component Value Date/Time  ? CHOL 165 03/28/2018 1032  ? TRIG 77 03/28/2018 1032  ? HDL 64 03/28/2018 1032  ? LDLCALC 86 03/28/2018 1032  ? ? ? ?Risk Assessment/Calculations:   ?{Does this patient have ATRIAL FIBRILLATION?:[phone]} ? ?    ? ?Physical Exam:   ? ?VS:  There were no vitals taken for this visit.   ? ?Wt Readings from Last 3 Encounters:  ?02/19/21 266 lb (120.7 kg)  ?02/15/21 266 lb 4 oz (120.8 kg)  ?06/12/20 255 lb (115.7 kg)  ?  ? ?GEN: *** Well nourished, well developed in no acute distress ?HEENT: Normal ?NECK: No JVD; No carotid bruits ?LYMPHATICS: No lymphadenopathy ?CARDIAC: ***RRR, no murmurs, rubs, gallops ?RESPIRATORY:  Clear to auscultation without rales, wheezing or rhonchi  ?ABDOMEN: Soft, non-tender, non-distended ?MUSCULOSKELETAL:  No edema; No deformity   ?SKIN: Warm and dry ?NEUROLOGIC:  Alert and oriented x 3 ?PSYCHIATRIC:  Normal affect  ? ?ASSESSMENT:   ? ?No diagnosis found. ?PLAN:   ? ?In order of problems listed above: ? ?*** ? ?   ? ?{Are you ordering a CV Procedure (e.g. stress test, cath, DCCV, TEE, etc)?   Press F2        :  [id number]}  ? ? ?Medication Adjustments/Labs and Tests Ordered: ?Current medicines are reviewed at length with the patient today.  Concerns regarding medicines are outlined above.  ?No orders of the defined types were placed in this encounter. ? ?No orders of the defined types were placed in this encounter. ? ? ?There are no Patient Instructions on file for this visit.  ? ?Signed, ?Maisie FusBranch, Tennile Styles E, MD  ?10/18/2021 8:05 AM    ?[facility] ?

## 2021-10-27 ENCOUNTER — Other Ambulatory Visit: Payer: Self-pay | Admitting: Family Medicine

## 2021-10-27 DIAGNOSIS — K7689 Other specified diseases of liver: Secondary | ICD-10-CM | POA: Diagnosis not present

## 2021-10-27 DIAGNOSIS — I491 Atrial premature depolarization: Secondary | ICD-10-CM | POA: Diagnosis not present

## 2021-10-27 DIAGNOSIS — M109 Gout, unspecified: Secondary | ICD-10-CM | POA: Diagnosis not present

## 2021-10-27 DIAGNOSIS — D509 Iron deficiency anemia, unspecified: Secondary | ICD-10-CM | POA: Diagnosis not present

## 2021-10-27 DIAGNOSIS — M5441 Lumbago with sciatica, right side: Secondary | ICD-10-CM | POA: Diagnosis not present

## 2021-11-01 ENCOUNTER — Ambulatory Visit
Admission: RE | Admit: 2021-11-01 | Discharge: 2021-11-01 | Disposition: A | Discharge status: Home / Self Care | Payer: Medicare Other | Source: Ambulatory Visit | Attending: Family Medicine | Admitting: Family Medicine

## 2021-11-01 DIAGNOSIS — K7689 Other specified diseases of liver: Secondary | ICD-10-CM | POA: Diagnosis not present

## 2021-11-01 DIAGNOSIS — Z9049 Acquired absence of other specified parts of digestive tract: Secondary | ICD-10-CM | POA: Diagnosis not present

## 2021-11-30 DIAGNOSIS — K760 Fatty (change of) liver, not elsewhere classified: Secondary | ICD-10-CM | POA: Diagnosis not present

## 2021-11-30 DIAGNOSIS — Z823 Family history of stroke: Secondary | ICD-10-CM | POA: Diagnosis not present

## 2021-11-30 DIAGNOSIS — R0602 Shortness of breath: Secondary | ICD-10-CM | POA: Diagnosis not present

## 2021-11-30 DIAGNOSIS — Z91030 Bee allergy status: Secondary | ICD-10-CM | POA: Diagnosis not present

## 2021-11-30 DIAGNOSIS — Z79899 Other long term (current) drug therapy: Secondary | ICD-10-CM | POA: Diagnosis not present

## 2021-11-30 DIAGNOSIS — Z8261 Family history of arthritis: Secondary | ICD-10-CM | POA: Diagnosis not present

## 2021-11-30 DIAGNOSIS — K7689 Other specified diseases of liver: Secondary | ICD-10-CM | POA: Diagnosis not present

## 2021-11-30 DIAGNOSIS — Z8249 Family history of ischemic heart disease and other diseases of the circulatory system: Secondary | ICD-10-CM | POA: Diagnosis not present

## 2021-11-30 DIAGNOSIS — Z885 Allergy status to narcotic agent status: Secondary | ICD-10-CM | POA: Diagnosis not present

## 2021-11-30 DIAGNOSIS — Z8349 Family history of other endocrine, nutritional and metabolic diseases: Secondary | ICD-10-CM | POA: Diagnosis not present

## 2021-11-30 DIAGNOSIS — Z888 Allergy status to other drugs, medicaments and biological substances status: Secondary | ICD-10-CM | POA: Diagnosis not present

## 2021-11-30 DIAGNOSIS — M545 Low back pain, unspecified: Secondary | ICD-10-CM | POA: Diagnosis not present

## 2021-11-30 DIAGNOSIS — Z7984 Long term (current) use of oral hypoglycemic drugs: Secondary | ICD-10-CM | POA: Diagnosis not present

## 2021-11-30 DIAGNOSIS — R1084 Generalized abdominal pain: Secondary | ICD-10-CM | POA: Diagnosis not present

## 2021-11-30 DIAGNOSIS — Z833 Family history of diabetes mellitus: Secondary | ICD-10-CM | POA: Diagnosis not present

## 2021-12-10 DIAGNOSIS — K7689 Other specified diseases of liver: Secondary | ICD-10-CM | POA: Diagnosis not present

## 2021-12-10 DIAGNOSIS — K429 Umbilical hernia without obstruction or gangrene: Secondary | ICD-10-CM | POA: Diagnosis not present

## 2022-02-08 DIAGNOSIS — M109 Gout, unspecified: Secondary | ICD-10-CM | POA: Diagnosis not present

## 2022-02-08 DIAGNOSIS — K7689 Other specified diseases of liver: Secondary | ICD-10-CM | POA: Diagnosis not present

## 2022-02-08 DIAGNOSIS — D509 Iron deficiency anemia, unspecified: Secondary | ICD-10-CM | POA: Diagnosis not present

## 2022-02-08 DIAGNOSIS — G894 Chronic pain syndrome: Secondary | ICD-10-CM | POA: Diagnosis not present

## 2022-02-08 DIAGNOSIS — R7303 Prediabetes: Secondary | ICD-10-CM | POA: Diagnosis not present

## 2022-02-08 DIAGNOSIS — R609 Edema, unspecified: Secondary | ICD-10-CM | POA: Diagnosis not present

## 2022-02-08 DIAGNOSIS — R002 Palpitations: Secondary | ICD-10-CM | POA: Diagnosis not present

## 2022-03-02 ENCOUNTER — Other Ambulatory Visit: Payer: Self-pay | Admitting: Internal Medicine

## 2022-03-02 ENCOUNTER — Encounter: Payer: Self-pay | Admitting: Internal Medicine

## 2022-03-02 ENCOUNTER — Ambulatory Visit: Payer: Medicare Other | Admitting: Internal Medicine

## 2022-03-02 VITALS — Temp 98.3°F | Resp 16 | Ht 69.0 in | Wt 276.6 lb

## 2022-03-02 DIAGNOSIS — R002 Palpitations: Secondary | ICD-10-CM | POA: Insufficient documentation

## 2022-03-02 DIAGNOSIS — I10 Essential (primary) hypertension: Secondary | ICD-10-CM | POA: Diagnosis not present

## 2022-03-02 DIAGNOSIS — R0609 Other forms of dyspnea: Secondary | ICD-10-CM | POA: Diagnosis not present

## 2022-03-02 MED ORDER — METOPROLOL SUCCINATE ER 25 MG PO TB24: 25.0000 mg | ORAL_TABLET | Freq: Two times a day (BID) | ORAL | 2 refills | Status: DC

## 2022-03-02 MED ORDER — TORSEMIDE 40 MG PO TABS: 40.0000 mg | ORAL_TABLET | Freq: Every day | ORAL | 2 refills | Status: AC

## 2022-03-02 NOTE — Progress Notes (Signed)
Primary Physician/Referring:  Harlan Stains, MD  Patient ID: Jasmine Contreras, female    DOB: Dec 14, 1950, 71 y.o.   MRN: [id number]  Chief Complaint  Patient presents with   New Patient (Initial Visit)   Referral   HPI:    Jasmine Contreras  is a 71 y.o. female with HTN, palpitations, and DOE. She also has significant lower extremity edema and she feels like the fluid is backing up into her lungs as well. She complains of orthopnea and PND and she has never had this before. Patient is on a diuretic and is agreeable to doubling her torsemide at this time. She denies syncope, claudication, chest pain, or diaphoresis. She has had a sleep study in the past and was not diagnosed with sleep apnea. She does not smoke or drink alcohol.  Past Medical History:  Diagnosis Date   Acid reflux    Anemia    Anxiety    situational   Arthritis    trochanteric bursitis   Asthma    years ago   Back pain    Chest pain    Complication of anesthesia    slow to wake up   Cough    Depression    situaltional   Diarrhea    Difficulty swallowing    Dizziness    Double vision    Dry mouth    Dysrhythmia    PAC's, PVC's   Fatigue    GERD (gastroesophageal reflux disease)    Gout    Headache    migraines ocassionally - stress related   Heart murmur    not heard by Dr. Arby Barrette in 2014.  Dr. Dema Severin did not hear  murmer at 05/15/20 appointment.   Hepatitis    cyst   Herpes    Hiatal hernia    Hypertension    Irritable bowel syndrome    Itching    Joint pain    MVP (mitral valve prolapse)    Neck stiffness    Palpitations    Pneumonia    PONV (postoperative nausea and vomiting)    PUD (peptic ulcer disease)    s/p vagotomy   Retaining fluid    Ringing in ears    Shortness of breath    exertion   Stress    Swelling of extremity    Swelling of finger    TIA (transient ischemic attack)    Tinnitus    Varicose veins    Vertigo    Vision changes    Vitamin D deficiency    Wheezing     Past Surgical History:  Procedure Laterality Date   APPENDECTOMY     BREAST EXCISIONAL BIOPSY Left    CARDIAC CATHETERIZATION     Twice, was found normal, no stents needed   CHOLECYSTECTOMY N/A 11/08/2019   Procedure: LAPAROSCOPIC TO DRAIN HEPATIC CYSTS AND UPPER ENDOSCOPY;  Surgeon: Johnathan Hausen, MD;  Location: [facility];  Service: General;  Laterality: N/A;   COLONOSCOPY     EYE SURGERY Bilateral    Lasik surgery   FOOT SURGERY Bilateral    hammer toes, in grown toemails   GALLBLADDER SURGERY     06/09/20--- YEARS AGO- (OTHER GB SURGERY LISTED WAS ON LIVER CYST)   LAPAROSCOPIC LIVER CYST UNROOFING N/A 06/12/2020   Procedure: LAPAROSCOPIC FENESTRATION OF HEPATIC CYSTS;  Surgeon: Dwan Bolt, MD;  Location: [facility];  Service: General;  Laterality: N/A;   LASIK     OPEN PARTIAL HEPATECTOMY  N/A 02/19/2021  Procedure: OPEN FENESTRATION OF HEPATIC CYST WITH INTRAOPERATIVE ULTRASOUND;  Surgeon: Fritzi Mandes, MD;  Location: [facility];  Service: General;  Laterality: N/A;   partial gastrectomy with vagotomy     right arthroscopic knee surgery Right    trochanteric bursectomy Bilateral    hips   VAGINAL HYSTERECTOMY     with oophorectomy   Family History  Problem Relation Age of Onset   Diabetes Mother    Hypertension Mother    Kidney disease Mother    Alcohol abuse Father    Hyperlipidemia Father    Hypertension Father    Stroke Father    Depression Father    Sleep apnea Father     Social History   Tobacco Use   Smoking status: Never   Smokeless tobacco: Never  Substance Use Topics   Alcohol use: Yes    Alcohol/week: 0.0 standard drinks of alcohol    Comment: 1 glass of wine with dinner- ocassional   Marital Status: Married  ROS  Review of Systems  Cardiovascular:  Positive for chest pain, dyspnea on exertion, irregular heartbeat, leg swelling, orthopnea and palpitations.  Respiratory:  Positive for shortness of breath and sleep disturbances due to breathing.    All other systems reviewed and are negative.  Objective  Temperature 98.3 F (36.8 C), temperature source Temporal, resp. rate 16, height 5\' 9"  (1.753 m), weight 276 lb 9.6 oz (125.5 kg), SpO2 96 %. Body mass index is 40.85 kg/m.     03/02/2022    8:42 AM 02/25/2021    1:12 PM 02/25/2021    9:08 AM  Vitals with BMI  Height 5\' 9"     Weight 276 lbs 10 oz    BMI 40.83    Systolic  110 128  Diastolic  62 70  Pulse  67 74     Physical Exam Vitals and nursing note reviewed.  Constitutional:      General: She is not in acute distress.    Appearance: Normal appearance.  HENT:     Head: Normocephalic and atraumatic.  Neck:     Vascular: No carotid bruit.  Cardiovascular:     Rate and Rhythm: Normal rate and regular rhythm.     Pulses: Normal pulses.     Heart sounds: Normal heart sounds. No murmur heard.    No gallop.  Pulmonary:     Effort: Pulmonary effort is normal.     Breath sounds: Examination of the right-lower field reveals decreased breath sounds. Examination of the left-lower field reveals decreased breath sounds. Decreased breath sounds present.  Abdominal:     General: Bowel sounds are normal.     Palpations: Abdomen is soft.  Musculoskeletal:     Right lower leg: Edema present.     Left lower leg: Edema present.  Skin:    General: Skin is warm and dry.  Neurological:     Mental Status: She is alert.   Medications and allergies   Allergies  Allergen Reactions   Contrast Media [Iodinated Contrast Media] Itching   Honey Bee Treatment [Bee Venom] Anaphylaxis and Swelling    Body swells   Other Other (See Comments)    Has diverticulitis- cannot have ANY FOODS WITH SEEDS!! Tomato   Peanuts [Peanut Oil] Hives    peanuts   Shellfish-Derived Products Anaphylaxis   Strawberry Extract Anaphylaxis   Chlorhexidine Itching   Abilify [Aripiprazole] Other (See Comments)    Drowsiness     Adhesive [Tape] Hives and Itching  Barium     Patient allergic to  preservative in redi cat per Dr. Eppie Gibson patient should not have redi cat oral //rls   Codeine Itching and Nausea And Vomiting   Hydrocodone Itching and Nausea And Vomiting   Latex Hives and Itching   Meclizine Hcl Nausea Only   Oxycodone Hcl Itching and Nausea And Vomiting   Prednisone Other (See Comments) and Hypertension    "Makes my face turn red, also"   Scopace [Scopolamine] Nausea Only   Sertraline Other (See Comments)    Suicidal thoughts    Tavist-D [[employer] Dayhist-D] Other (See Comments)    Makes my nose drain   Tramadol Nausea Only     Medication list after today's encounter   Current Outpatient Medications:    acetaminophen (TYLENOL) 325 MG tablet, Take 325-650 mg by mouth every 6 (six) hours as needed (for pain or headaches)., Disp: , Rfl:    albuterol (VENTOLIN HFA) 108 (90 Base) MCG/ACT inhaler, Inhale into the lungs every 6 (six) hours as needed for wheezing or shortness of breath., Disp: , Rfl:    Ascorbic Acid (VITAMIN C) 1000 MG tablet, Take 2,000 mg by mouth daily., Disp: , Rfl:    Biotin 5 MG TABS, Take 5 mg by mouth daily., Disp: , Rfl:    carboxymethylcellulose (REFRESH PLUS) 0.5 % SOLN, Place 1 drop into both eyes daily as needed (Dry eye)., Disp: , Rfl:    Cholecalciferol (VITAMIN D3) 50 MCG (2000 UT) TABS, Take 2,000 Units by mouth daily., Disp: , Rfl:    colchicine 0.6 MG tablet, Take 0.6 mg by mouth daily as needed (as directed for gout flares). , Disp: , Rfl:    Cyanocobalamin (VITAMIN B-12 PO), Take 2,500 mcg by mouth daily., Disp: , Rfl:    EPINEPHrine 0.3 mg/0.3 mL IJ SOAJ injection, Inject 0.3 mg into the muscle once as needed for anaphylaxis. , Disp: , Rfl:    ferrous gluconate (FERGON) 240 (27 FE) MG tablet, Take 240 mg by mouth daily., Disp: , Rfl:    levocetirizine (XYZAL) 5 MG tablet, Take 5 mg by mouth daily as needed for allergies., Disp: , Rfl:    methocarbamol (ROBAXIN) 750 MG tablet, Take 1 tablet (750 mg total) by mouth every 6 (six) hours  as needed for muscle spasms., Disp: 20 tablet, Rfl: 0   metoprolol succinate (TOPROL XL) 25 MG 24 hr tablet, Take 1 tablet (25 mg total) by mouth in the morning and at bedtime., Disp: 60 tablet, Rfl: 2   pantoprazole (PROTONIX) 40 MG tablet, Take 30- 60 min before your first and last meals of the day (Patient taking differently: Take 40 mg by mouth 2 (two) times daily.), Disp: , Rfl:    potassium chloride SA (K-DUR,KLOR-CON) 20 MEQ tablet, Take 40 mEq by mouth 2 (two) times daily., Disp: , Rfl:    sucralfate (CARAFATE) 1 g tablet, Take 1 g by mouth 2 (two) times daily., Disp: , Rfl:    torsemide 40 MG TABS, Take 40 mg by mouth daily. As directed, Disp: 60 tablet, Rfl: 2   TURMERIC PO, Take 300 mg by mouth daily., Disp: , Rfl:    gabapentin 50 MG TABS, Take 300 mg by mouth 2 (two) times daily for 5 days., Disp: 10 tablet, Rfl: 0  Laboratory examination:   Lab Results  Component Value Date   NA 139 02/23/2021   K 3.7 02/23/2021   CO2 24 02/23/2021   GLUCOSE 116 (H) 02/23/2021   BUN  9 02/23/2021   CREATININE 0.86 02/23/2021   CALCIUM 8.8 (L) 02/23/2021   GFRNONAA >60 02/23/2021       Latest Ref Rng & Units 02/23/2021    1:06 AM 02/21/2021    6:32 AM 02/21/2021    1:10 AM  CMP  Glucose 70 - 99 mg/dL 116  110  122   BUN 8 - 23 mg/dL 9  12  12    Creatinine 0.44 - 1.00 mg/dL 0.86  1.03  1.02   Sodium 135 - 145 mmol/L 139  141  140   Potassium 3.5 - 5.1 mmol/L 3.7  3.6  3.5   Chloride 98 - 111 mmol/L 107  107  106   CO2 22 - 32 mmol/L 24  27  28    Calcium 8.9 - 10.3 mg/dL 8.8  7.9  7.8   Total Protein 6.5 - 8.1 g/dL 6.0  5.9  5.8   Total Bilirubin 0.3 - 1.2 mg/dL 0.9  1.3  1.2   Alkaline Phos 38 - 126 U/L 74  71  75   AST 15 - 41 U/L 32  106  123   ALT 0 - 44 U/L 76  143  156       Latest Ref Rng & Units 02/21/2021    1:10 AM 02/20/2021    4:39 AM 02/15/2021   10:39 AM  CBC  WBC 4.0 - 10.5 K/uL 8.9  10.2  6.2   Hemoglobin 12.0 - 15.0 g/dL 9.9  10.1  11.9   Hematocrit 36.0 - 46.0  % 31.2  31.9  37.7   Platelets 150 - 400 K/uL 214  243  289     Lipid Panel No results for input(s): "CHOL", "TRIG", "[place]", "VLDL", "HDL", "CHOLHDL", "LDLDIRECT" in the last 8760 hours.  HEMOGLOBIN A1C Lab Results  Component Value Date   HGBA1C 6.3 (H) 11/08/2019   MPG 134.11 11/08/2019   TSH No results for input(s): "TSH" in the last 8760 hours.  External labs:     Radiology:    Cardiac Studies:   No results found for this or any previous visit from the past 1095 days.     No results found for this or any previous visit from the past 1095 days.     EKG:   03/02/22 normal sinus rhythm    Assessment     ICD-10-CM   1. Palpitations  R00.2 EKG 12-Lead    PCV ECHOCARDIOGRAM COMPLETE    PCV MYOCARDIAL PERFUSION WO LEXISCAN    Basic metabolic panel    Lipid Panel With LDL/HDL Ratio    2. Essential hypertension  I10 PCV ECHOCARDIOGRAM COMPLETE    PCV MYOCARDIAL PERFUSION WO LEXISCAN    Basic metabolic panel    Lipid Panel With LDL/HDL Ratio    3. DOE (dyspnea on exertion)  R06.09 PCV ECHOCARDIOGRAM COMPLETE    PCV MYOCARDIAL PERFUSION WO LEXISCAN    Basic metabolic panel    Lipid Panel With LDL/HDL Ratio       Orders Placed This Encounter  Procedures   Basic metabolic panel    Standing Status:   Future    Standing Expiration Date:   03/03/2023   Lipid Panel With LDL/HDL Ratio   PCV MYOCARDIAL PERFUSION WO LEXISCAN    Standing Status:   Future    Standing Expiration Date:   05/02/2022   EKG 12-Lead   PCV ECHOCARDIOGRAM COMPLETE    Standing Status:   Future    Standing Expiration Date:  03/03/2023    Meds ordered this encounter  Medications   torsemide 40 MG TABS    Sig: Take 40 mg by mouth daily. As directed    Dispense:  60 tablet    Refill:  2   metoprolol succinate (TOPROL XL) 25 MG 24 hr tablet    Sig: Take 1 tablet (25 mg total) by mouth in the morning and at bedtime.    Dispense:  60 tablet    Refill:  2    Medications Discontinued  During This Encounter  Medication Reason   docusate sodium (COLACE) 100 MG capsule    indomethacin (INDOCIN) 50 MG capsule    nitroGLYCERIN (NITROSTAT) 0.4 MG SL tablet    OVER THE COUNTER MEDICATION    OVER THE COUNTER MEDICATION    oxyCODONE (OXY IR/ROXICODONE) 5 MG immediate release tablet    torsemide (DEMADEX) 20 MG tablet    metoprolol succinate (TOPROL-XL) 25 MG 24 hr tablet      Recommendations:   Jasmine Contreras is a 71 y.o.  F with HTN, palpitations, and DOE  Continue current cardiac medications. Torsemide increased to 40mg  daily due to orthopnea and edema. Check BMP after 1 week on higher dose diuretic. Will check lipid panel, discuss statin at next visit. Toprol 25mg  to be taken BID due to severe palpitations. New prescriptions sent to patient's pharmacy. Encourage low-sodium diet, less than 2000 mg daily. Schedule imaging tests in office - echo and stress test. Follow-up in 1-2 months or sooner if needed.     Floydene Flock, DO, [facility]  03/02/2022, 9:20 AM Office: [phone] Pager: [phone]

## 2022-03-23 ENCOUNTER — Ambulatory Visit: Payer: Medicare Other

## 2022-03-23 DIAGNOSIS — I10 Essential (primary) hypertension: Secondary | ICD-10-CM | POA: Diagnosis not present

## 2022-03-23 DIAGNOSIS — R002 Palpitations: Secondary | ICD-10-CM | POA: Diagnosis not present

## 2022-03-23 DIAGNOSIS — R0609 Other forms of dyspnea: Secondary | ICD-10-CM | POA: Diagnosis not present

## 2022-03-28 DIAGNOSIS — H6123 Impacted cerumen, bilateral: Secondary | ICD-10-CM | POA: Diagnosis not present

## 2022-03-29 ENCOUNTER — Other Ambulatory Visit: Payer: Medicare Other

## 2022-04-05 ENCOUNTER — Ambulatory Visit: Payer: Medicare Other

## 2022-04-05 DIAGNOSIS — R002 Palpitations: Secondary | ICD-10-CM

## 2022-04-05 DIAGNOSIS — I10 Essential (primary) hypertension: Secondary | ICD-10-CM

## 2022-04-05 DIAGNOSIS — R0609 Other forms of dyspnea: Secondary | ICD-10-CM

## 2022-04-13 ENCOUNTER — Encounter: Payer: Self-pay | Admitting: Internal Medicine

## 2022-04-13 ENCOUNTER — Ambulatory Visit: Payer: Medicare Other | Admitting: Internal Medicine

## 2022-04-13 VITALS — BP 151/67 | HR 67 | Temp 97.9°F | Resp 16 | Ht 69.0 in | Wt 277.2 lb

## 2022-04-13 DIAGNOSIS — I10 Essential (primary) hypertension: Secondary | ICD-10-CM

## 2022-04-13 DIAGNOSIS — R7303 Prediabetes: Secondary | ICD-10-CM

## 2022-04-13 DIAGNOSIS — R002 Palpitations: Secondary | ICD-10-CM

## 2022-04-13 NOTE — Progress Notes (Signed)
Primary Physician/Referring:  Harlan Stains, MD  Patient ID: Jasmine Contreras, female    DOB: July 02, 1951, 71 y.o.   MRN: [id number]  Chief Complaint  Patient presents with   Follow-up   HPI:    Jasmine Contreras  is a 71 y.o. female with HTN, palpitations, and DOE who is here for a follow-up visit. She denies syncope, claudication, chest pain, or diaphoresis. She is still having abdominal/epigastric pain and SOB with activity. She states she gained a lot of weight recently and she stress eats a lot. She is very deconditioned which could be causing her symptoms in addition to possible sleep apnea. She has had a sleep study in the past and was not diagnosed with sleep apnea, however, the sleep study was many years ago and patient has gained a significant amount of weight since then. Her husband is present at our appointment and confirms his wife does snore at night. She will consider repeating a sleep study because had a bad experience and could not sleep last time.   Past Medical History:  Diagnosis Date   Acid reflux    Anemia    Anxiety    situational   Arthritis    trochanteric bursitis   Asthma    years ago   Back pain    Chest pain    Complication of anesthesia    slow to wake up   Cough    Depression    situaltional   Diarrhea    Difficulty swallowing    Dizziness    Double vision    Dry mouth    Dysrhythmia    PAC's, PVC's   Fatigue    GERD (gastroesophageal reflux disease)    Gout    Headache    migraines ocassionally - stress related   Heart murmur    not heard by Dr. Arby Barrette in 2014.  Dr. Dema Severin did not hear  murmer at 05/15/20 appointment.   Hepatitis    cyst   Herpes    Hiatal hernia    Hypertension    Irritable bowel syndrome    Itching    Joint pain    MVP (mitral valve prolapse)    Neck stiffness    Palpitations    Pneumonia    PONV (postoperative nausea and vomiting)    PUD (peptic ulcer disease)    s/p vagotomy   Retaining fluid    Ringing  in ears    Shortness of breath    exertion   Stress    Swelling of extremity    Swelling of finger    TIA (transient ischemic attack)    Tinnitus    Varicose veins    Vertigo    Vision changes    Vitamin D deficiency    Wheezing    Past Surgical History:  Procedure Laterality Date   APPENDECTOMY     BREAST EXCISIONAL BIOPSY Left    CARDIAC CATHETERIZATION     Twice, was found normal, no stents needed   CHOLECYSTECTOMY N/A 11/08/2019   Procedure: LAPAROSCOPIC TO DRAIN HEPATIC CYSTS AND UPPER ENDOSCOPY;  Surgeon: Johnathan Hausen, MD;  Location: [facility];  Service: General;  Laterality: N/A;   COLONOSCOPY     EYE SURGERY Bilateral    Lasik surgery   FOOT SURGERY Bilateral    hammer toes, in grown toemails   GALLBLADDER SURGERY     06/09/20--- YEARS AGO- (OTHER GB SURGERY LISTED WAS ON LIVER CYST)   LAPAROSCOPIC LIVER CYST UNROOFING  N/A 06/12/2020   Procedure: LAPAROSCOPIC FENESTRATION OF HEPATIC CYSTS;  Surgeon: Fritzi Mandes, MD;  Location: [facility] OR;  Service: General;  Laterality: N/A;   LASIK     OPEN PARTIAL HEPATECTOMY  N/A 02/19/2021   Procedure: OPEN FENESTRATION OF HEPATIC CYST WITH INTRAOPERATIVE ULTRASOUND;  Surgeon: Fritzi Mandes, MD;  Location: [facility] OR;  Service: General;  Laterality: N/A;   partial gastrectomy with vagotomy     right arthroscopic knee surgery Right    trochanteric bursectomy Bilateral    hips   VAGINAL HYSTERECTOMY     with oophorectomy   Family History  Problem Relation Age of Onset   Diabetes Mother    Hypertension Mother    Kidney disease Mother    Alcohol abuse Father    Hyperlipidemia Father    Hypertension Father    Stroke Father    Depression Father    Sleep apnea Father     Social History   Tobacco Use   Smoking status: Never   Smokeless tobacco: Never  Substance Use Topics   Alcohol use: Yes    Alcohol/week: 0.0 standard drinks of alcohol    Comment: 1 glass of wine with dinner- ocassional   Marital Status: Married  ROS   Review of Systems  Respiratory:  Positive for shortness of breath, sleep disturbances due to breathing and snoring.   All other systems reviewed and are negative.  Objective  Blood pressure (!) 151/67, pulse 67, temperature 97.9 F (36.6 C), temperature source Temporal, resp. rate 16, height 5\' 9"  (1.753 m), weight 277 lb 3.2 oz (125.7 kg), SpO2 95 %. Body mass index is 40.94 kg/m.     04/13/2022    9:09 AM 03/02/2022    8:42 AM 02/25/2021    1:12 PM  Vitals with BMI  Height 5\' 9"  5\' 9"    Weight 277 lbs 3 oz 276 lbs 10 oz   BMI 40.92 40.83   Systolic 151  110  Diastolic 67  62  Pulse 67  67     Physical Exam Vitals and nursing note reviewed.  Constitutional:      General: She is not in acute distress.    Appearance: Normal appearance.  HENT:     Head: Normocephalic and atraumatic.  Neck:     Vascular: No carotid bruit.  Cardiovascular:     Rate and Rhythm: Normal rate and regular rhythm.     Pulses: Normal pulses.     Heart sounds: Normal heart sounds. No murmur heard.    No gallop.  Pulmonary:     Effort: Pulmonary effort is normal.     Breath sounds: Examination of the right-lower field reveals decreased breath sounds. Examination of the left-lower field reveals decreased breath sounds. Decreased breath sounds present.  Abdominal:     General: Bowel sounds are normal.     Palpations: Abdomen is soft.  Musculoskeletal:     Right lower leg: No edema.     Left lower leg: No edema.  Skin:    General: Skin is warm and dry.  Neurological:     Mental Status: She is alert.    Medications and allergies   Allergies  Allergen Reactions   Contrast Media [Iodinated Contrast Media] Itching   Honey Bee Treatment [Bee Venom] Anaphylaxis and Swelling    Body swells   Other Other (See Comments)    Has diverticulitis- cannot have ANY FOODS WITH SEEDS!! Tomato   Peanuts [Peanut Oil] Hives    peanuts  Shellfish-Derived Products Anaphylaxis   Strawberry Extract Anaphylaxis    Chlorhexidine Itching   Abilify [Aripiprazole] Other (See Comments)    Drowsiness     Adhesive [Tape] Hives and Itching   Barium     Patient allergic to preservative in redi cat per Dr. Eppie Gibson patient should not have redi cat oral //rls   Codeine Itching and Nausea And Vomiting   Hydrocodone Itching and Nausea And Vomiting   Latex Hives and Itching   Meclizine Hcl Nausea Only   Oxycodone Hcl Itching and Nausea And Vomiting   Prednisone Other (See Comments) and Hypertension    "Makes my face turn red, also"   Scopace [Scopolamine] Nausea Only   Sertraline Other (See Comments)    Suicidal thoughts    Tavist-D [Albertsons Dayhist-D] Other (See Comments)    Makes my nose drain   Tramadol Nausea Only     Medication list after today's encounter   Current Outpatient Medications:    acetaminophen (TYLENOL) 325 MG tablet, Take 325-650 mg by mouth every 6 (six) hours as needed (for pain or headaches)., Disp: , Rfl:    albuterol (VENTOLIN HFA) 108 (90 Base) MCG/ACT inhaler, Inhale into the lungs every 6 (six) hours as needed for wheezing or shortness of breath., Disp: , Rfl:    Ascorbic Acid (VITAMIN C) 1000 MG tablet, Take 2,000 mg by mouth daily., Disp: , Rfl:    Biotin 5 MG TABS, Take 5 mg by mouth daily., Disp: , Rfl:    carboxymethylcellulose (REFRESH PLUS) 0.5 % SOLN, Place 1 drop into both eyes daily as needed (Dry eye)., Disp: , Rfl:    Cholecalciferol (VITAMIN D3) 50 MCG (2000 UT) TABS, Take 2,000 Units by mouth daily., Disp: , Rfl:    colchicine 0.6 MG tablet, Take 0.6 mg by mouth daily as needed (as directed for gout flares). , Disp: , Rfl:    Cyanocobalamin (VITAMIN B-12 PO), Take 2,500 mcg by mouth daily., Disp: , Rfl:    EPINEPHrine 0.3 mg/0.3 mL IJ SOAJ injection, Inject 0.3 mg into the muscle once as needed for anaphylaxis. , Disp: , Rfl:    ferrous gluconate (FERGON) 240 (27 FE) MG tablet, Take 240 mg by mouth daily., Disp: , Rfl:    levocetirizine (XYZAL) 5 MG tablet,  Take 5 mg by mouth daily as needed for allergies., Disp: , Rfl:    methocarbamol (ROBAXIN) 750 MG tablet, Take 1 tablet (750 mg total) by mouth every 6 (six) hours as needed for muscle spasms., Disp: 20 tablet, Rfl: 0   metoprolol succinate (TOPROL XL) 25 MG 24 hr tablet, Take 1 tablet (25 mg total) by mouth in the morning and at bedtime., Disp: 60 tablet, Rfl: 2   pantoprazole (PROTONIX) 40 MG tablet, Take 30- 60 min before your first and last meals of the day (Patient taking differently: Take 40 mg by mouth 2 (two) times daily.), Disp: , Rfl:    potassium chloride SA (K-DUR,KLOR-CON) 20 MEQ tablet, Take 40 mEq by mouth 2 (two) times daily., Disp: , Rfl:    sucralfate (CARAFATE) 1 g tablet, Take 1 g by mouth 2 (two) times daily., Disp: , Rfl:    torsemide 40 MG TABS, Take 40 mg by mouth daily. As directed, Disp: 60 tablet, Rfl: 2   TURMERIC PO, Take 300 mg by mouth daily., Disp: , Rfl:   Laboratory examination:   Lab Results  Component Value Date   NA 139 02/23/2021   K 3.7 02/23/2021   CO2  24 02/23/2021   GLUCOSE 116 (H) 02/23/2021   BUN 9 02/23/2021   CREATININE 0.86 02/23/2021   CALCIUM 8.8 (L) 02/23/2021   GFRNONAA >60 02/23/2021       Latest Ref Rng & Units 02/23/2021    1:06 AM 02/21/2021    6:32 AM 02/21/2021    1:10 AM  CMP  Glucose 70 - 99 mg/dL 130116  865110  784122   BUN 8 - 23 mg/dL 9  12  12    Creatinine 0.44 - 1.00 mg/dL 6.960.86  2.951.03  2.841.02   Sodium 135 - 145 mmol/L 139  141  140   Potassium 3.5 - 5.1 mmol/L 3.7  3.6  3.5   Chloride 98 - 111 mmol/L 107  107  106   CO2 22 - 32 mmol/L 24  27  28    Calcium 8.9 - 10.3 mg/dL 8.8  7.9  7.8   Total Protein 6.5 - 8.1 g/dL 6.0  5.9  5.8   Total Bilirubin 0.3 - 1.2 mg/dL 0.9  1.3  1.2   Alkaline Phos 38 - 126 U/L 74  71  75   AST 15 - 41 U/L 32  106  123   ALT 0 - 44 U/L 76  143  156       Latest Ref Rng & Units 02/21/2021    1:10 AM 02/20/2021    4:39 AM 02/15/2021   10:39 AM  CBC  WBC 4.0 - 10.5 K/uL 8.9  10.2  6.2    Hemoglobin 12.0 - 15.0 g/dL 9.9  13.210.1  44.011.9   Hematocrit 36.0 - 46.0 % 31.2  31.9  37.7   Platelets 150 - 400 K/uL 214  243  289     Lipid Panel No results for input(s): "CHOL", "TRIG", "LDLCALC", "VLDL", "HDL", "CHOLHDL", "LDLDIRECT" in the last 8760 hours.  HEMOGLOBIN A1C Lab Results  Component Value Date   HGBA1C 6.3 (H) 11/08/2019   MPG 134.11 11/08/2019   TSH No results for input(s): "TSH" in the last 8760 hours.  External labs:     Radiology:    Cardiac Studies:   Echocardiogram 04/05/2022:  Normal LV systolic function with visual EF 55-60%. Left ventricle cavity  is normal in size. Moderate left ventricular hypertrophy. Normal global  wall motion. Indeterminate diastolic filling pattern, elevated LAP.  Aortic valve sclerosis without stenosis. Mild (Grade I) aortic  regurgitation.  Native mitral valve, suggestive of possible rheumatic morphology.  No  evidence of mitral stenosis.  Mild (Grade I) mitral regurgitation.  Mild tricuspid regurgitation. No evidence of pulmonary hypertension.  No prior study for comparison..   Exercise Tetrofosmin stress test 03/23/2022: Exercise nuclear stress test was performed using Bruce protocol. Patient reached 4.6 METS, and 91% of age predicted maximum heart rate. Exercise capacity was very low. No chest pain reported. Heart rate and hemodynamic response were normal. Peak EKG demonstrated sinus tachycardia, occasional PAC, <1 mm upsloping ST depression in inferolateral leads. These changes are negative for ischemia.  Normal myocardial perfusion. Stress LVEF 52%. Low risk study.   EKG:   03/02/22 normal sinus rhythm    Assessment     ICD-10-CM   1. Palpitations  R00.2 Lipid Panel With LDL/HDL Ratio    2. Essential hypertension  I10 Lipid Panel With LDL/HDL Ratio    3. Prediabetes  R73.03        Orders Placed This Encounter  Procedures   Lipid Panel With LDL/HDL Ratio    No orders of the  defined types were placed in  this encounter.   Medications Discontinued During This Encounter  Medication Reason   gabapentin 50 MG TABS      Recommendations:   Jasmine Contreras is a 71 y.o.  F with HTN, palpitations   Palpitations Continue current cardiac medications. Patient will consider sleep study in the future   Essential hypertension Toprol 25 mg BID Torsemide 40 mg qDay Encourage low-sodium diet, less than 2000 mg daily. Bp elevated today because she has not taken her meds yet   Prediabetes Patient following with weight loss clinic She does not want to be on more meds right now Will check lipid panel, discuss statin at next visit.   Follow-up in 3 months or sooner if needed    Clotilde Dieter, DO, [facility]  04/13/2022, 1:58 PM Office: [phone] Pager: [phone]

## 2022-06-20 DIAGNOSIS — I491 Atrial premature depolarization: Secondary | ICD-10-CM | POA: Diagnosis not present

## 2022-06-20 DIAGNOSIS — J301 Allergic rhinitis due to pollen: Secondary | ICD-10-CM | POA: Diagnosis not present

## 2022-06-20 DIAGNOSIS — M5441 Lumbago with sciatica, right side: Secondary | ICD-10-CM | POA: Diagnosis not present

## 2022-06-20 DIAGNOSIS — K7689 Other specified diseases of liver: Secondary | ICD-10-CM | POA: Diagnosis not present

## 2022-06-20 DIAGNOSIS — E1169 Type 2 diabetes mellitus with other specified complication: Secondary | ICD-10-CM | POA: Diagnosis not present

## 2022-06-20 DIAGNOSIS — M109 Gout, unspecified: Secondary | ICD-10-CM | POA: Diagnosis not present

## 2022-06-20 DIAGNOSIS — R609 Edema, unspecified: Secondary | ICD-10-CM | POA: Diagnosis not present

## 2022-07-04 HISTORY — PX: BREAST BIOPSY: SHX20

## 2022-07-14 ENCOUNTER — Ambulatory Visit: Payer: Self-pay | Admitting: Internal Medicine

## 2022-07-15 ENCOUNTER — Other Ambulatory Visit: Payer: Self-pay | Admitting: Family Medicine

## 2022-07-15 DIAGNOSIS — R928 Other abnormal and inconclusive findings on diagnostic imaging of breast: Secondary | ICD-10-CM

## 2022-07-25 ENCOUNTER — Other Ambulatory Visit: Payer: Self-pay | Admitting: Family Medicine

## 2022-07-25 ENCOUNTER — Ambulatory Visit
Admission: RE | Admit: 2022-07-25 | Discharge: 2022-07-25 | Disposition: A | Discharge status: Home / Self Care | Payer: Medicare Other | Source: Ambulatory Visit | Attending: Family Medicine | Admitting: Family Medicine

## 2022-07-25 DIAGNOSIS — R928 Other abnormal and inconclusive findings on diagnostic imaging of breast: Secondary | ICD-10-CM

## 2022-07-25 DIAGNOSIS — R599 Enlarged lymph nodes, unspecified: Secondary | ICD-10-CM

## 2022-07-25 NOTE — Progress Notes (Signed)
No show

## 2022-07-28 ENCOUNTER — Ambulatory Visit
Admission: RE | Admit: 2022-07-28 | Discharge: 2022-07-28 | Disposition: A | Discharge status: Home / Self Care | Payer: Medicare Other | Source: Ambulatory Visit | Attending: Family Medicine | Admitting: Family Medicine

## 2022-07-28 ENCOUNTER — Other Ambulatory Visit (HOSPITAL_COMMUNITY)
Admission: RE | Admit: 2022-07-28 | Discharge: 2022-07-28 | Disposition: A | Discharge status: Home / Self Care | Payer: Medicare Other | Source: Ambulatory Visit | Attending: Diagnostic Radiology | Admitting: Diagnostic Radiology

## 2022-07-28 DIAGNOSIS — R599 Enlarged lymph nodes, unspecified: Secondary | ICD-10-CM

## 2022-07-28 DIAGNOSIS — R928 Other abnormal and inconclusive findings on diagnostic imaging of breast: Secondary | ICD-10-CM

## 2022-08-01 LAB — SURGICAL PATHOLOGY

## 2022-08-31 ENCOUNTER — Other Ambulatory Visit: Payer: Self-pay | Admitting: Gastroenterology

## 2022-08-31 DIAGNOSIS — R1011 Right upper quadrant pain: Secondary | ICD-10-CM

## 2022-08-31 DIAGNOSIS — K5790 Diverticulosis of intestine, part unspecified, without perforation or abscess without bleeding: Secondary | ICD-10-CM

## 2022-09-02 ENCOUNTER — Ambulatory Visit
Admission: RE | Admit: 2022-09-02 | Discharge: 2022-09-02 | Disposition: A | Discharge status: Home / Self Care | Payer: Medicare Other | Source: Ambulatory Visit | Attending: Gastroenterology | Admitting: Gastroenterology

## 2022-09-02 DIAGNOSIS — R1011 Right upper quadrant pain: Secondary | ICD-10-CM

## 2022-09-02 DIAGNOSIS — K5790 Diverticulosis of intestine, part unspecified, without perforation or abscess without bleeding: Secondary | ICD-10-CM

## 2022-09-27 ENCOUNTER — Other Ambulatory Visit: Payer: Medicare Other

## 2022-12-22 DIAGNOSIS — Z86010 Personal history of colonic polyps: Secondary | ICD-10-CM | POA: Diagnosis not present

## 2022-12-22 DIAGNOSIS — R1013 Epigastric pain: Secondary | ICD-10-CM | POA: Diagnosis not present

## 2022-12-22 DIAGNOSIS — K5730 Diverticulosis of large intestine without perforation or abscess without bleeding: Secondary | ICD-10-CM | POA: Diagnosis not present

## 2022-12-22 DIAGNOSIS — Z980 Intestinal bypass and anastomosis status: Secondary | ICD-10-CM | POA: Diagnosis not present

## 2022-12-22 DIAGNOSIS — K649 Unspecified hemorrhoids: Secondary | ICD-10-CM | POA: Diagnosis not present

## 2022-12-22 DIAGNOSIS — Z09 Encounter for follow-up examination after completed treatment for conditions other than malignant neoplasm: Secondary | ICD-10-CM | POA: Diagnosis not present

## 2022-12-29 DIAGNOSIS — K219 Gastro-esophageal reflux disease without esophagitis: Secondary | ICD-10-CM | POA: Diagnosis not present

## 2022-12-29 DIAGNOSIS — E119 Type 2 diabetes mellitus without complications: Secondary | ICD-10-CM | POA: Diagnosis not present

## 2023-05-08 DIAGNOSIS — M25562 Pain in left knee: Secondary | ICD-10-CM | POA: Diagnosis not present

## 2023-05-26 ENCOUNTER — Other Ambulatory Visit: Payer: Self-pay | Admitting: Family Medicine

## 2023-05-26 DIAGNOSIS — Z91030 Bee allergy status: Secondary | ICD-10-CM | POA: Diagnosis not present

## 2023-05-26 DIAGNOSIS — E2839 Other primary ovarian failure: Secondary | ICD-10-CM

## 2023-05-26 DIAGNOSIS — D509 Iron deficiency anemia, unspecified: Secondary | ICD-10-CM | POA: Diagnosis not present

## 2023-05-26 DIAGNOSIS — Z0000 Encounter for general adult medical examination without abnormal findings: Secondary | ICD-10-CM | POA: Diagnosis not present

## 2023-05-26 DIAGNOSIS — I491 Atrial premature depolarization: Secondary | ICD-10-CM | POA: Diagnosis not present

## 2023-05-26 DIAGNOSIS — K7689 Other specified diseases of liver: Secondary | ICD-10-CM | POA: Diagnosis not present

## 2023-05-26 DIAGNOSIS — E119 Type 2 diabetes mellitus without complications: Secondary | ICD-10-CM | POA: Diagnosis not present

## 2023-05-26 DIAGNOSIS — M109 Gout, unspecified: Secondary | ICD-10-CM | POA: Diagnosis not present

## 2023-05-26 DIAGNOSIS — E1169 Type 2 diabetes mellitus with other specified complication: Secondary | ICD-10-CM | POA: Diagnosis not present

## 2023-05-26 DIAGNOSIS — K219 Gastro-esophageal reflux disease without esophagitis: Secondary | ICD-10-CM | POA: Diagnosis not present

## 2023-05-26 DIAGNOSIS — H6123 Impacted cerumen, bilateral: Secondary | ICD-10-CM | POA: Diagnosis not present

## 2023-05-29 ENCOUNTER — Ambulatory Visit: Payer: Medicare Other | Admitting: Orthopedic Surgery

## 2023-05-29 ENCOUNTER — Other Ambulatory Visit (INDEPENDENT_AMBULATORY_CARE_PROVIDER_SITE_OTHER): Payer: Medicare Other

## 2023-05-29 ENCOUNTER — Telehealth: Payer: Self-pay | Admitting: Radiology

## 2023-05-29 VITALS — Ht 67.0 in | Wt 273.0 lb

## 2023-05-29 DIAGNOSIS — M25562 Pain in left knee: Secondary | ICD-10-CM

## 2023-05-29 DIAGNOSIS — G8929 Other chronic pain: Secondary | ICD-10-CM | POA: Diagnosis not present

## 2023-05-29 DIAGNOSIS — M1712 Unilateral primary osteoarthritis, left knee: Secondary | ICD-10-CM | POA: Diagnosis not present

## 2023-05-29 NOTE — Telephone Encounter (Signed)
Please obtain authorization for gel injection left knee-Jasmine Contreras

## 2023-05-30 ENCOUNTER — Encounter: Payer: Self-pay | Admitting: Orthopedic Surgery

## 2023-05-30 MED ORDER — BUPIVACAINE HCL 0.25 % IJ SOLN
4.0000 mL | INTRAMUSCULAR | Status: AC | PRN
  Administered 2023-05-29: 4 mL via INTRA_ARTICULAR

## 2023-05-30 MED ORDER — LIDOCAINE HCL 1 % IJ SOLN
5.0000 mL | INTRAMUSCULAR | Status: AC | PRN
  Administered 2023-05-29: 5 mL

## 2023-05-30 MED ORDER — METHYLPREDNISOLONE ACETATE 40 MG/ML IJ SUSP
40.0000 mg | INTRAMUSCULAR | Status: AC | PRN
  Administered 2023-05-29: 40 mg via INTRA_ARTICULAR

## 2023-05-30 NOTE — Telephone Encounter (Signed)
VOB submitted for Durolane, left knee.

## 2023-05-30 NOTE — Progress Notes (Signed)
Office Visit Note   Patient: Jasmine Contreras           Date of Birth: July 09, 1950           MRN: [id number] Visit Date: 05/29/2023 Requested by: Laurann Montana, MD [phone] [address] [place],  [place] [address] PCP: Laurann Montana, MD  Subjective: Chief Complaint  Patient presents with   Left Knee - Pain    HPI: Jasmine Contreras is a 72 y.o. female who presents to the office reporting left knee pain of 6 weeks duration.  Patient denies any history of injury.  She tried oral prednisone which helped some.  Pain is medial.  She does report decreased mobility.  Denies any history of prior surgery to the knee..                ROS: All systems reviewed are negative as they relate to the chief complaint within the history of present illness.  Patient denies fevers or chills.  Assessment & Plan: Visit Diagnoses:  1. Chronic pain of left knee     Plan: Impression is symptomatic left knee arthritis with trace effusion in the knee.  Radiographs do show moderate arthritis in all 3 compartments.  Aspiration and injection performed today.  When this wears off I think she may derive some benefit from gel injection.  Will see her back at that time. This patient is diagnosed with osteoarthritis of the knee(s).    Radiographs show evidence of joint space narrowing, osteophytes, subchondral sclerosis and/or subchondral cysts.  This patient has knee pain which interferes with functional and activities of daily living.    This patient has experienced inadequate response, adverse effects and/or intolerance with conservative treatments such as acetaminophen, NSAIDS, topical creams, physical therapy or regular exercise, knee bracing and/or weight loss.   This patient has experienced inadequate response or has a contraindication to intra articular steroid injections for at least 3 months.   This patient is not scheduled to have a total knee replacement within 6 months of starting treatment with  viscosupplementation.   Follow-Up Instructions: No follow-ups on file.   Orders:  Orders Placed This Encounter  Procedures   XR KNEE 3 VIEW LEFT   No orders of the defined types were placed in this encounter.     Procedures: Large Joint Inj: L knee on 05/29/2023 6:27 AM Indications: diagnostic evaluation, joint swelling and pain Details: 18 G 1.5 in needle, superolateral approach  Arthrogram: No  Medications: 5 mL lidocaine 1 %; 40 mg methylPREDNISolone acetate 40 MG/ML; 4 mL bupivacaine 0.25 % Outcome: tolerated well, no immediate complications Procedure, treatment alternatives, risks and benefits explained, specific risks discussed. Consent was given by the patient. Immediately prior to procedure a time out was called to verify the correct patient, procedure, equipment, support staff and site/side marked as required. Patient was prepped and draped in the usual sterile fashion.     This patient is diagnosed with osteoarthritis of the knee(s).    Radiographs show evidence of joint space narrowing, osteophytes, subchondral sclerosis and/or subchondral cysts.  This patient has knee pain which interferes with functional and activities of daily living.    This patient has experienced inadequate response, adverse effects and/or intolerance with conservative treatments such as acetaminophen, NSAIDS, topical creams, physical therapy or regular exercise, knee bracing and/or weight loss.   This patient has experienced inadequate response or has a contraindication to intra articular steroid injections for at least 3 months.  This patient is not scheduled to have a total knee replacement within 6 months of starting treatment with viscosupplementation.   Clinical Data: No additional findings.  Objective: Vital Signs: Ht 5\' 7"  (1.702 m)   Wt 273 lb (123.8 kg)   BMI 42.76 kg/m   Physical Exam:  Constitutional: Patient appears well-developed HEENT:  Head: Normocephalic Eyes:EOM  are normal Neck: Normal range of motion Cardiovascular: Normal rate Pulmonary/chest: Effort normal Neurologic: Patient is alert Skin: Skin is warm Psychiatric: Patient has normal mood and affect  Ortho Exam: Ortho exam demonstrates antalgic gait to the left.  Trace effusion in that left knee.  Lacks about 5 degrees of full extension but can bend to about 100 degrees.  Collateral cruciate ligaments are stable.  Pedal pulses are palpable.  No nerve root tension signs.  No groin pain with internal/external rotation of the left leg.  Specialty Comments:  No specialty comments available.  Imaging: XR KNEE 3 VIEW LEFT  Result Date: 05/30/2023 AP lateral merchant radiographs left knee reviewed.  Alignment intact.  Moderate to severe tricompartmental arthritis is present with no acute fracture.    PMFS History: Patient Active Problem List   Diagnosis Date Noted   Essential hypertension 03/02/2022   Palpitations 03/02/2022   Hepatic cyst 06/12/2020   Polycystic liver disease 11/09/2019   Polycystic liver disease, congenital 11/08/2019   Right upper quadrant abdominal pain    Hypokalemia    Obesity (BMI 30-39.9)    Liver cyst 11/15/2018   Class 3 severe obesity with serious comorbidity and body mass index (BMI) of 40.0 to 44.9 in adult [facility]) 05/30/2018   Prediabetes 04/11/2018   DOE (dyspnea on exertion) 01/17/2018   Morbid obesity due to excess calories (HCC) 01/17/2018   Upper airway cough syndrome 01/16/2018   Chest pain 08/28/2012   Abnormal cardiovascular function study 08/28/2012   Past Medical History:  Diagnosis Date   Acid reflux    Anemia    Anxiety    situational   Arthritis    trochanteric bursitis   Asthma    years ago   Back pain    Chest pain    Complication of anesthesia    slow to wake up   Cough    Depression    situaltional   Diarrhea    Difficulty swallowing    Dizziness    Double vision    Dry mouth    Dysrhythmia    PAC's, PVC's   Fatigue     GERD (gastroesophageal reflux disease)    Gout    Headache    migraines ocassionally - stress related   Heart murmur    not heard by Dr. Renold Don in 2014.  Dr. Cliffton Asters did not hear  murmer at 05/15/20 appointment.   Hepatitis    cyst   Herpes    Hiatal hernia    Hypertension    Irritable bowel syndrome    Itching    Joint pain    MVP (mitral valve prolapse)    Neck stiffness    Palpitations    Pneumonia    PONV (postoperative nausea and vomiting)    PUD (peptic ulcer disease)    s/p vagotomy   Retaining fluid    Ringing in ears    Shortness of breath    exertion   Stress    Swelling of extremity    Swelling of finger    TIA (transient ischemic attack)    Tinnitus    Varicose veins  Vertigo    Vision changes    Vitamin D deficiency    Wheezing     Family History  Problem Relation Age of Onset   Diabetes Mother    Hypertension Mother    Kidney disease Mother    Alcohol abuse Father    Hyperlipidemia Father    Hypertension Father    Stroke Father    Depression Father    Sleep apnea Father     Past Surgical History:  Procedure Laterality Date   APPENDECTOMY     BREAST EXCISIONAL BIOPSY Left    CARDIAC CATHETERIZATION     Twice, was found normal, no stents needed   CHOLECYSTECTOMY N/A 11/08/2019   Procedure: LAPAROSCOPIC TO DRAIN HEPATIC CYSTS AND UPPER ENDOSCOPY;  Surgeon: Luretha Murphy, MD;  Location: [facility];  Service: General;  Laterality: N/A;   COLONOSCOPY     EYE SURGERY Bilateral    Lasik surgery   FOOT SURGERY Bilateral    hammer toes, in grown toemails   GALLBLADDER SURGERY     06/09/20--- YEARS AGO- (OTHER GB SURGERY LISTED WAS ON LIVER CYST)   LAPAROSCOPIC LIVER CYST UNROOFING N/A 06/12/2020   Procedure: LAPAROSCOPIC FENESTRATION OF HEPATIC CYSTS;  Surgeon: Fritzi Mandes, MD;  Location: [facility];  Service: General;  Laterality: N/A;   LASIK     OPEN PARTIAL HEPATECTOMY  N/A 02/19/2021   Procedure: OPEN FENESTRATION OF HEPATIC CYST WITH  INTRAOPERATIVE ULTRASOUND;  Surgeon: Fritzi Mandes, MD;  Location: [facility] OR;  Service: General;  Laterality: N/A;   partial gastrectomy with vagotomy     right arthroscopic knee surgery Right    trochanteric bursectomy Bilateral    hips   VAGINAL HYSTERECTOMY     with oophorectomy   Social History   Occupational History   Occupation: Retired   Tobacco Use   Smoking status: Never   Smokeless tobacco: Never  Vaping Use   Vaping status: Former  Substance and Sexual Activity   Alcohol use: Yes    Alcohol/week: 0.0 standard drinks of alcohol    Comment: 1 glass of wine with dinner- ocassional   Drug use: Not Currently    Comment: cbd oils- 06/09/20- have not used in a while    Sexual activity: Not Currently    Birth control/protection: Surgical

## 2023-06-12 ENCOUNTER — Encounter (INDEPENDENT_AMBULATORY_CARE_PROVIDER_SITE_OTHER): Payer: Self-pay | Admitting: Otolaryngology

## 2023-07-25 ENCOUNTER — Telehealth (INDEPENDENT_AMBULATORY_CARE_PROVIDER_SITE_OTHER): Payer: Self-pay | Admitting: Otolaryngology

## 2023-07-25 NOTE — Telephone Encounter (Signed)
Confirmed appt & location [phone] af[facility]

## 2023-07-26 ENCOUNTER — Ambulatory Visit (INDEPENDENT_AMBULATORY_CARE_PROVIDER_SITE_OTHER): Payer: Medicare Other

## 2023-07-26 VITALS — BP 130/81 | HR 68 | Ht 67.0 in | Wt 263.0 lb

## 2023-07-26 DIAGNOSIS — H6123 Impacted cerumen, bilateral: Secondary | ICD-10-CM | POA: Diagnosis not present

## 2023-07-26 DIAGNOSIS — H900 Conductive hearing loss, bilateral: Secondary | ICD-10-CM | POA: Diagnosis not present

## 2023-07-26 NOTE — Progress Notes (Unsigned)
05

## 2023-07-27 DIAGNOSIS — H900 Conductive hearing loss, bilateral: Secondary | ICD-10-CM | POA: Insufficient documentation

## 2023-07-27 DIAGNOSIS — H6123 Impacted cerumen, bilateral: Secondary | ICD-10-CM | POA: Insufficient documentation

## 2023-07-27 NOTE — Progress Notes (Signed)
Patient ID: Jasmine Contreras, female   DOB: 1950/08/13, 73 y.o.   MRN: [id number]  CC: Bilateral muffled hearing  HPI:  Jasmine Contreras is a 73 y.o. female who presents today complaining of bilateral muffled hearing for the past few months.  The patient has a history of stenotic ear canals and recurrent cerumen impaction.  Currently she denies any otalgia, otorrhea, or vertigo.  She has no recent otitis media or otitis externa.  The patient denies any previous otologic surgery.  Past Medical History:  Diagnosis Date   Acid reflux    Anemia    Anxiety    situational   Arthritis    trochanteric bursitis   Asthma    years ago   Back pain    Chest pain    Complication of anesthesia    slow to wake up   Cough    Depression    situaltional   Diarrhea    Difficulty swallowing    Dizziness    Double vision    Dry mouth    Dysrhythmia    PAC's, PVC's   Fatigue    GERD (gastroesophageal reflux disease)    Gout    Headache    migraines ocassionally - stress related   Heart murmur    not heard by Dr. Renold Don in 2014.  Dr. Cliffton Asters did not hear  murmer at 05/15/20 appointment.   Hepatitis    cyst   Herpes    Hiatal hernia    Hypertension    Irritable bowel syndrome    Itching    Joint pain    MVP (mitral valve prolapse)    Neck stiffness    Palpitations    Pneumonia    PONV (postoperative nausea and vomiting)    PUD (peptic ulcer disease)    s/p vagotomy   Retaining fluid    Ringing in ears    Shortness of breath    exertion   Stress    Swelling of extremity    Swelling of finger    TIA (transient ischemic attack)    Tinnitus    Varicose veins    Vertigo    Vision changes    Vitamin D deficiency    Wheezing     Past Surgical History:  Procedure Laterality Date   APPENDECTOMY     BREAST EXCISIONAL BIOPSY Left    CARDIAC CATHETERIZATION     Twice, was found normal, no stents needed   CHOLECYSTECTOMY N/A 11/08/2019   Procedure: LAPAROSCOPIC TO DRAIN HEPATIC  CYSTS AND UPPER ENDOSCOPY;  Surgeon: Luretha Murphy, MD;  Location: [facility];  Service: General;  Laterality: N/A;   COLONOSCOPY     EYE SURGERY Bilateral    Lasik surgery   FOOT SURGERY Bilateral    hammer toes, in grown toemails   GALLBLADDER SURGERY     06/09/20--- YEARS AGO- (OTHER GB SURGERY LISTED WAS ON LIVER CYST)   LAPAROSCOPIC LIVER CYST UNROOFING N/A 06/12/2020   Procedure: LAPAROSCOPIC FENESTRATION OF HEPATIC CYSTS;  Surgeon: Fritzi Mandes, MD;  Location: [facility];  Service: General;  Laterality: N/A;   LASIK     OPEN PARTIAL HEPATECTOMY  N/A 02/19/2021   Procedure: OPEN FENESTRATION OF HEPATIC CYST WITH INTRAOPERATIVE ULTRASOUND;  Surgeon: Fritzi Mandes, MD;  Location: [facility];  Service: General;  Laterality: N/A;   partial gastrectomy with vagotomy     right arthroscopic knee surgery Right    trochanteric bursectomy Bilateral    hips  VAGINAL HYSTERECTOMY     with oophorectomy    Family History  Problem Relation Age of Onset   Diabetes Mother    Hypertension Mother    Kidney disease Mother    Alcohol abuse Father    Hyperlipidemia Father    Hypertension Father    Stroke Father    Depression Father    Sleep apnea Father     Social History:  reports that she has never smoked. She has never used smokeless tobacco. She reports current alcohol use. She reports that she does not currently use drugs.  Allergies:  Allergies  Allergen Reactions   Contrast Media [Iodinated Contrast Media] Itching   Honey Bee Treatment [Bee Venom] Anaphylaxis and Swelling    Body swells   Other Other (See Comments)    Has diverticulitis- cannot have ANY FOODS WITH SEEDS!! Tomato   Peanuts [Peanut Oil] Hives    peanuts   Shellfish-Derived Products Anaphylaxis   Strawberry Extract Anaphylaxis   Chlorhexidine Itching   Abilify [Aripiprazole] Other (See Comments)    Drowsiness     Adhesive [Tape] Hives and Itching   Barium     Patient allergic to preservative in redi cat per Dr.  Eppie Gibson patient should not have redi cat oral //rls   Codeine Itching and Nausea And Vomiting   Hydrocodone Itching and Nausea And Vomiting   Latex Hives and Itching   Meclizine Hcl Nausea Only   Oxycodone Hcl Itching and Nausea And Vomiting   Prednisone Other (See Comments) and Hypertension    "Makes my face turn red, also"   Scopace [Scopolamine] Nausea Only   Sertraline Other (See Comments)    Suicidal thoughts    Tavist-D [[employer] Dayhist-D] Other (See Comments)    Makes my nose drain   Tramadol Nausea Only    Prior to Admission medications   Medication Sig Start Date End Date Taking? Authorizing Provider  acetaminophen (TYLENOL) 325 MG tablet Take 325-650 mg by mouth every 6 (six) hours as needed (for pain or headaches).   Yes [provider]  albuterol (VENTOLIN HFA) 108 (90 Base) MCG/ACT inhaler Inhale into the lungs every 6 (six) hours as needed for wheezing or shortness of breath.   Yes [provider]  Ascorbic Acid (VITAMIN C) 1000 MG tablet Take 2,000 mg by mouth daily.   Yes [provider]  Biotin 5 MG TABS Take 5 mg by mouth daily.   Yes [provider]  carboxymethylcellulose (REFRESH PLUS) 0.5 % SOLN Place 1 drop into both eyes daily as needed (Dry eye).   Yes [provider]  Cholecalciferol (VITAMIN D3) 50 MCG (2000 UT) TABS Take 2,000 Units by mouth daily.   Yes [provider]  colchicine 0.6 MG tablet Take 0.6 mg by mouth daily as needed (as directed for gout flares).    Yes [provider]  Cyanocobalamin (VITAMIN B-12 PO) Take 2,500 mcg by mouth daily.   Yes [provider]  EPINEPHrine 0.3 mg/0.3 mL IJ SOAJ injection Inject 0.3 mg into the muscle once as needed for anaphylaxis.    Yes [provider]  ferrous gluconate (FERGON) 240 (27 FE) MG tablet Take 240 mg by mouth daily.   Yes [provider]  levocetirizine (XYZAL) 5 MG tablet Take 5 mg by mouth daily as needed for  allergies. 01/28/21  Yes [provider]  methocarbamol (ROBAXIN) 750 MG tablet Take 1 tablet (750 mg total) by mouth every 6 (six) hours as needed for  muscle spasms. 02/25/21  Yes Fritzi Mandes, MD  metoprolol succinate (TOPROL XL) 25 MG 24 hr tablet Take 1 tablet (25 mg total) by mouth in the morning and at bedtime. 03/02/22  Yes Custovic, Rozell Searing, DO  pantoprazole (PROTONIX) 40 MG tablet Take 30- 60 min before your first and last meals of the day Patient taking differently: Take 40 mg by mouth 2 (two) times daily. 01/16/18  Yes Nyoka Cowden, MD  potassium chloride SA (K-DUR,KLOR-CON) 20 MEQ tablet Take 40 mEq by mouth 2 (two) times daily.   Yes [provider]  sucralfate (CARAFATE) 1 g tablet Take 1 g by mouth 2 (two) times daily.   Yes [provider]  TURMERIC PO Take 300 mg by mouth daily.   Yes [provider]  torsemide 40 MG TABS Take 40 mg by mouth daily. As directed 03/02/22 05/31/22  Custovic, Rozell Searing, DO    Blood pressure 130/81, pulse 68, height 5\' 7"  (1.702 m), weight 263 lb (119.3 kg), SpO2 97%. Exam: General: Communicates without difficulty, well nourished, no acute distress. Head: Normocephalic, no evidence injury, no tenderness, facial buttresses intact without stepoff. Face/sinus: No tenderness to palpation and percussion. Facial movement is normal and symmetric. Eyes: PERRL, EOMI. No scleral icterus, conjunctivae clear. Neuro: CN II exam reveals vision grossly intact.  No nystagmus at any point of gaze. Ears: Auricles well formed without lesions.  Bilateral cerumen impaction.  Nose: External evaluation reveals normal support and skin without lesions.  Dorsum is intact.  Anterior rhinoscopy reveals congested mucosa over anterior aspect of inferior turbinates and intact septum.  No purulence noted. Oral:  Oral cavity and oropharynx are intact, symmetric, without erythema or edema.  Mucosa is moist without lesions. Neck: Full range of motion without  pain.  There is no significant lymphadenopathy.  No masses palpable.  Thyroid bed within normal limits to palpation.  Parotid glands and submandibular glands equal bilaterally without mass.  Trachea is midline. Neuro:  CN 2-12 grossly intact.   Procedure: Bilateral cerumen disimpaction Anesthesia: None Description: Under the operating microscope, the cerumen is carefully removed with a combination of cerumen currette, alligator forceps, and suction catheters.  After the cerumen is removed, the TMs are noted to be normal.  No mass, erythema, or lesions. The patient tolerated the procedure well.    Assessment: 1.  Bilateral cerumen impaction, causing bilateral conductive hearing loss. 2.  Bilateral stenotic ear canals.  After the cerumen disimpaction procedure, both tympanic membranes and middle ear spaces are noted to be normal. 3.  The patient reports significant improvement in her hearing after the cerumen disimpaction.  Plan: 1.  Otomicroscopy with bilateral cerumen disimpaction. 2.  The physical exam findings are reviewed with the patient. 3.  The patient is instructed not to use Q-tips to clean her ear canals. 4.  The patient will return for reevaluation in 6 months.  Lashan Macias W Micai Apolinar 07/27/2023, 9:11 AM

## 2023-09-28 ENCOUNTER — Ambulatory Visit (INDEPENDENT_AMBULATORY_CARE_PROVIDER_SITE_OTHER)

## 2023-09-28 ENCOUNTER — Other Ambulatory Visit: Payer: Self-pay | Admitting: Cardiology

## 2023-09-28 ENCOUNTER — Ambulatory Visit: Attending: Cardiology | Admitting: Cardiology

## 2023-09-28 ENCOUNTER — Encounter: Payer: Self-pay | Admitting: Cardiology

## 2023-09-28 VITALS — BP 148/80 | HR 52 | Ht 67.0 in | Wt 264.4 lb

## 2023-09-28 DIAGNOSIS — R079 Chest pain, unspecified: Secondary | ICD-10-CM | POA: Diagnosis not present

## 2023-09-28 DIAGNOSIS — R0789 Other chest pain: Secondary | ICD-10-CM | POA: Diagnosis not present

## 2023-09-28 DIAGNOSIS — I10 Essential (primary) hypertension: Secondary | ICD-10-CM | POA: Diagnosis not present

## 2023-09-28 DIAGNOSIS — E1100 Type 2 diabetes mellitus with hyperosmolarity without nonketotic hyperglycemic-hyperosmolar coma (NKHHC): Secondary | ICD-10-CM | POA: Diagnosis not present

## 2023-09-28 DIAGNOSIS — Z01812 Encounter for preprocedural laboratory examination: Secondary | ICD-10-CM

## 2023-09-28 DIAGNOSIS — R002 Palpitations: Secondary | ICD-10-CM

## 2023-09-28 MED ORDER — NITROGLYCERIN 0.4 MG SL SUBL: 0.4000 mg | SUBLINGUAL_TABLET | SUBLINGUAL | 3 refills | Status: AC | PRN

## 2023-09-28 NOTE — Progress Notes (Signed)
 Cardiology Office Note:    Date:  10/01/2023   ID:  Jasmine Contreras, DOB [birth year], MRN [id number]  PCP:  Jasmine Montana, MD  Cardiologist:  None  Electrophysiologist:  None   Referring MD: Jasmine Montana, MD   " I am having chest pain"   History of Present Illness:    Jasmine Contreras is a 73 y.o. female with a hx of Hypertension, PVC  Discussed the use of AI scribe software for clinical note transcription with the patient, who gave verbal consent to proceed.  She presents with palpitations described as her "heart doing dances." These episodes have been lasting longer than usual and are occurring more frequently. She also reports experiencing fluttering in her eyes and nearly passing out. The patient has started experiencing headaches, which is a new symptom for her. She wakes up at night, initially attributing this to acid reflux. She also reports chest pain, which she describes as starting like an electric shock.  She also admits to some intermittent chest pressure.  She also takes torsemide for fluid retention, which she reports is worsening during the summer. The patient has a history of gout and takes colchicine as needed for this condition. She is currently on Ozempic for prediabetes.       Past Medical History:  Diagnosis Date   Acid reflux    Anemia    Anxiety    situational   Arthritis    trochanteric bursitis   Asthma    years ago   Back pain    Chest pain    Complication of anesthesia    slow to wake up   Cough    Depression    situaltional   Diarrhea    Difficulty swallowing    Dizziness    Double vision    Dry mouth    Dysrhythmia    PAC's, PVC's   Fatigue    GERD (gastroesophageal reflux disease)    Gout    Headache    migraines ocassionally - stress related   Heart murmur    not heard by Dr. Renold Contreras in 2014.  Dr. Cliffton Contreras did not hear  murmer at 05/15/20 appointment.   Hepatitis    cyst   Herpes    Hiatal hernia    Hypertension    Irritable  bowel syndrome    Itching    Joint pain    MVP (mitral valve prolapse)    Neck stiffness    Palpitations    Pneumonia    PONV (postoperative nausea and vomiting)    PUD (peptic ulcer disease)    s/p vagotomy   Retaining fluid    Ringing in ears    Shortness of breath    exertion   Stress    Swelling of extremity    Swelling of finger    TIA (transient ischemic attack)    Tinnitus    Varicose veins    Vertigo    Vision changes    Vitamin D deficiency    Wheezing     Past Surgical History:  Procedure Laterality Date   APPENDECTOMY     BREAST EXCISIONAL BIOPSY Left    CARDIAC CATHETERIZATION     Twice, was found normal, no stents needed   CHOLECYSTECTOMY N/A 11/08/2019   Procedure: LAPAROSCOPIC TO DRAIN HEPATIC CYSTS AND UPPER ENDOSCOPY;  Surgeon: Jasmine Murphy, MD;  Location: [facility];  Service: General;  Laterality: N/A;   COLONOSCOPY     EYE SURGERY Bilateral  Lasik surgery   FOOT SURGERY Bilateral    hammer toes, in grown toemails   GALLBLADDER SURGERY     06/09/20--- YEARS AGO- (OTHER GB SURGERY LISTED WAS ON LIVER CYST)   LAPAROSCOPIC LIVER CYST UNROOFING N/A 06/12/2020   Procedure: LAPAROSCOPIC FENESTRATION OF HEPATIC CYSTS;  Surgeon: Jasmine Mandes, MD;  Location: [facility];  Service: General;  Laterality: N/A;   LASIK     OPEN PARTIAL HEPATECTOMY  N/A 02/19/2021   Procedure: OPEN FENESTRATION OF HEPATIC CYST WITH INTRAOPERATIVE ULTRASOUND;  Surgeon: Jasmine Mandes, MD;  Location: [facility];  Service: General;  Laterality: N/A;   partial gastrectomy with vagotomy     right arthroscopic knee surgery Right    trochanteric bursectomy Bilateral    hips   VAGINAL HYSTERECTOMY     with oophorectomy    Current Medications: Current Meds  Medication Sig   acetaminophen (TYLENOL) 325 MG tablet Take 325-650 mg by mouth every 6 (six) hours as needed (for pain or headaches).   albuterol (VENTOLIN HFA) 108 (90 Base) MCG/ACT inhaler Inhale into the lungs every 6 (six) hours  as needed for wheezing or shortness of breath.   Ascorbic Acid (VITAMIN C) 1000 MG tablet Take 2,000 mg by mouth daily.   Biotin 5 MG TABS Take 5 mg by mouth daily.   carboxymethylcellulose (REFRESH PLUS) 0.5 % SOLN Place 1 drop into both eyes daily as needed (Dry eye).   Cholecalciferol (VITAMIN D3) 50 MCG (2000 UT) TABS Take 2,000 Units by mouth daily.   colchicine 0.6 MG tablet Take 0.6 mg by mouth daily as needed (as directed for gout flares).    Cyanocobalamin (VITAMIN B-12 PO) Take 2,500 mcg by mouth daily.   EPINEPHrine 0.3 mg/0.3 mL IJ SOAJ injection Inject 0.3 mg into the muscle once as needed for anaphylaxis.    ferrous gluconate (FERGON) 240 (27 FE) MG tablet Take 240 mg by mouth daily.   levocetirizine (XYZAL) 5 MG tablet Take 5 mg by mouth daily as needed for allergies.   methocarbamol (ROBAXIN) 750 MG tablet Take 1 tablet (750 mg total) by mouth every 6 (six) hours as needed for muscle spasms.   metoprolol succinate (TOPROL XL) 25 MG 24 hr tablet Take 1 tablet (25 mg total) by mouth in the morning and at bedtime.   nitroGLYCERIN (NITROSTAT) 0.4 MG SL tablet Place 1 tablet (0.4 mg total) under the tongue every 5 (five) minutes as needed for chest pain.   pantoprazole (PROTONIX) 40 MG tablet Take 30- 60 min before your first and last meals of the day (Patient taking differently: Take 40 mg by mouth 2 (two) times daily.)   potassium chloride SA (K-DUR,KLOR-CON) 20 MEQ tablet Take 40 mEq by mouth 2 (two) times daily.   sucralfate (CARAFATE) 1 g tablet Take 1 g by mouth 2 (two) times daily.   TURMERIC PO Take 300 mg by mouth daily.     Allergies:   Contrast media [iodinated contrast media], Honey bee treatment [bee venom], Other, Peanuts [peanut oil], Shellfish-derived products, Strawberry extract, Chlorhexidine, Abilify [aripiprazole], Adhesive [tape], Barium, Codeine, Hydrocodone, Latex, Meclizine hcl, Oxycodone hcl, Prednisone, Scopace [scopolamine], Sertraline, Tavist-d [albertsons  dayhist-d], and Tramadol   Social History   Socioeconomic History   Marital status: Married    Spouse name: Jasmine Contreras   Number of children: 3   Years of education: College   Highest education level: Not on file  Occupational History   Occupation: Retired   Tobacco Use   Smoking  status: Never   Smokeless tobacco: Never  Vaping Use   Vaping status: Former  Substance and Sexual Activity   Alcohol use: Yes    Alcohol/week: 0.0 standard drinks of alcohol    Comment: 1 glass of wine with dinner- ocassional   Drug use: Not Currently    Comment: cbd oils- 06/09/20- have not used in a while    Sexual activity: Not Currently    Birth control/protection: Surgical  Other Topics Concern   Not on file  Social History Narrative   Patient drinks a pepsi or glass of tea in the morning.    Social Drivers of Corporate investment banker Strain: Not on file  Food Insecurity: Not on file  Transportation Needs: Not on file  Physical Activity: Not on file  Stress: Not on file  Social Connections: Not on file     Family History: The patient's family history includes Alcohol abuse in her father; Depression in her father; Diabetes in her mother; Hyperlipidemia in her father; Hypertension in her father and mother; Kidney disease in her mother; Sleep apnea in her father; Stroke in her father.  ROS:   Review of Systems  Constitution: Negative for decreased appetite, fever and weight gain.  HENT: Negative for congestion, ear discharge, hoarse voice and sore throat.   Eyes: Negative for discharge, redness, vision loss in right eye and visual halos.  Cardiovascular: Negative for chest pain, dyspnea on exertion, leg swelling, orthopnea and palpitations.  Respiratory: Negative for cough, hemoptysis, shortness of breath and snoring.   Endocrine: Negative for heat intolerance and polyphagia.  Hematologic/Lymphatic: Negative for bleeding problem. Does not bruise/bleed easily.  Skin: Negative for flushing,  nail changes, rash and suspicious lesions.  Musculoskeletal: Negative for arthritis, joint pain, muscle cramps, myalgias, neck pain and stiffness.  Gastrointestinal: Negative for abdominal pain, bowel incontinence, diarrhea and excessive appetite.  Genitourinary: Negative for decreased libido, genital sores and incomplete emptying.  Neurological: Negative for brief paralysis, focal weakness, headaches and loss of balance.  Psychiatric/Behavioral: Negative for altered mental status, depression and suicidal ideas.  Allergic/Immunologic: Negative for HIV exposure and persistent infections.    EKGs/Labs/Other Studies Reviewed:    The following studies were reviewed today:   EKG:  The ekg ordered today demonstrates   Recent Labs: No results found for requested labs within last 365 days.  Recent Lipid Panel    Component Value Date/Time   CHOL 165 03/28/2018 1032   TRIG 77 03/28/2018 1032   HDL 64 03/28/2018 1032   LDLCALC 86 03/28/2018 1032    Physical Exam:    VS:  BP (!) 148/80 (BP Location: Right Arm, Patient Position: Sitting, Cuff Size: Normal)   Pulse (!) 52   Ht 5\' 7"  (1.702 m)   Wt 264 lb 6.4 oz (119.9 kg)   SpO2 95%   BMI 41.41 kg/m     Wt Readings from Last 3 Encounters:  09/28/23 264 lb 6.4 oz (119.9 kg)  07/26/23 263 lb (119.3 kg)  05/29/23 273 lb (123.8 kg)     GEN: Well nourished, well developed in no acute distress HEENT: Normal NECK: No JVD; No carotid bruits LYMPHATICS: No lymphadenopathy CARDIAC: S1S2 noted,RRR, no murmurs, rubs, gallops RESPIRATORY:  Clear to auscultation without rales, wheezing or rhonchi  ABDOMEN: Soft, non-tender, non-distended, +bowel sounds, no guarding. EXTREMITIES: No edema, No cyanosis, no clubbing MUSCULOSKELETAL:  No deformity  SKIN: Warm and dry NEUROLOGIC:  Alert and oriented x 3, non-focal PSYCHIATRIC:  Normal affect, good insight  ASSESSMENT:    1. Essential hypertension   2. Chest pain of uncertain etiology   3.  Pre-procedure lab exam   4. Palpitations   5. Other chest pain   6. Type 2 diabetes mellitus with hyperosmolarity without coma, without long-term current use of insulin (HCC)    PLAN:    Assessment and Plan    Palpitations Frequent palpitations with suspected PVCs and potential atrial fibrillation. - Order 14-day cardiac monitor to assess for arrhythmias.   Chest Pain Intermittent nocturnal chest pain with concern for coronary artery disease. She has had multiple lexiscans which was normal due to contrast allergy she is not willing to get prep to consider CCTA or LHC. Therefore we are limited to nuclear imaging.-  - Prescribe nitroglycerin for severe chest pain, instructing to use up to two doses and seek emergency care if pain persists. - Instruct to monitor blood pressure daily and report readings after two weeks.  Hypertension Slightly elevated blood pressure with concurrent chest pain. Monitoring needed to assess chronic hypertension. - Instruct to monitor blood pressure daily and report readings after two weeks. - Consider antihypertensive therapy based on readings.  Fluid Retention Fluid retention managed with torsemide, worsens in summer. - Continue torsemide as prescribed. - Monitor fluid retention and adjust treatment as needed.  Type 2 Diabetes Mellitus On Ozempic for borderline diabetes and weight management. - Continue Ozempic for diabetes management and weight loss.  Gout Gout flares related to diet, managed with colchicine. - Continue colchicine as needed for gout flares. - Consider dietary modifications to reduce flares.  Fatty Liver Disease Fatty liver disease with recurrent cysts. Weight loss emphasized. - Encourage weight loss as a primary management strategy. - Coordinate with GI specialist for ongoing management of liver cysts.  Follow-up Follow-up needed to assess outcomes of diagnostic tests and treatment adjustments. - Schedule follow-up appointment  in 16 weeks. - Coordinate scheduling of coronary CT angiography. - Ensure she receives blood work to assess kidney function.                  The patient is in agreement with the above plan. The patient left the office in stable condition.  The patient will follow up in   Medication Adjustments/Labs and Tests Ordered: Current medicines are reviewed at length with the patient today.  Concerns regarding medicines are outlined above.  Orders Placed This Encounter  Procedures   Comprehensive Metabolic Panel (CMET)   Magnesium   LONG TERM MONITOR (3-14 DAYS)   MYOCARDIAL PERFUSION IMAGING   EKG 12-Lead   Meds ordered this encounter  Medications   nitroGLYCERIN (NITROSTAT) 0.4 MG SL tablet    Sig: Place 1 tablet (0.4 mg total) under the tongue every 5 (five) minutes as needed for chest pain.    Dispense:  25 tablet    Refill:  3    Patient Instructions  Medication Instructions:  Your physician has recommended you make the following change in your medication:  START: Nitroglycerin 0.4 mg place 1 tablet under your tongue every 5 minutes (up to three times) as needed for chest pain.  *If you need a refill on your cardiac medications before your next appointment, please call your pharmacy*  Lab Work: CMET, Mag If you have labs (blood work) drawn today and your tests are completely normal, you will receive your results only by: MyChart Message (if you have MyChart) OR A paper copy in the mail If you have any lab test that is abnormal or  we need to change your treatment, we will call you to review the results.  Testing/Procedures:  Christena Deem- Long Term Monitor Instructions Your physician has requested you wear a ZIO patch monitor for 14 days.  This is a single patch monitor. [employer] supplies one patch monitor per enrollment. Additional stickers are not available. Please do not apply patch if you will be having a Nuclear Stress Test,  Echocardiogram, Cardiac CT, MRI, or Chest Xray  during the period you would be wearing the  monitor. The patch cannot be worn during these tests. You cannot remove and re-apply the  ZIO XT patch monitor.  Your ZIO patch monitor will be mailed 3 day USPS to your address on file. It may take 3-5 days  to receive your monitor after you have been enrolled.  Once you have received your monitor, please review the enclosed instructions. Your monitor  has already been registered assigning a specific monitor serial # to you.  Billing and Patient Assistance Program Information  We have supplied [employer] with any of your insurance information on file for billing purposes. [employer] offers a sliding scale Patient Assistance Program for patients that do not have  insurance, or whose insurance does not completely cover the cost of the ZIO monitor.  You must apply for the Patient Assistance Program to qualify for this discounted rate.  To apply, please call [employer] at [phone], select option 4, select option 2, ask to apply for  Patient Assistance Program. Meredeth Ide will ask your household income, and how many people  are in your household. They will quote your out-of-pocket cost based on that information.  [employer] will also be able to set up a 80-month, interest-free payment plan if needed.  Applying the monitor   Shave hair from upper left chest.  Hold abrader disc by orange tab. Rub abrader in 40 strokes over the upper left chest as  indicated in your monitor instructions.  Clean area with 4 enclosed alcohol pads. Let dry.  Apply patch as indicated in monitor instructions. Patch will be placed under collarbone on left  side of chest with arrow pointing upward.  Rub patch adhesive wings for 2 minutes. Remove white label marked "1". Remove the white  label marked "2". Rub patch adhesive wings for 2 additional minutes.  While looking in a mirror, press and release button in center of patch. A small green light will  flash 3-4 times. This will be  your only indicator that the monitor has been turned on.  Do not shower for the first 24 hours. You may shower after the first 24 hours.  Press the button if you feel a symptom. You will hear a small click. Record Date, Time and  Symptom in the Patient Logbook.  When you are ready to remove the patch, follow instructions on the last 2 pages of Patient  Logbook. Stick patch monitor onto the last page of Patient Logbook.  Place Patient Logbook in the blue and white box. Use locking tab on box and tape box closed  securely. The blue and white box has prepaid postage on it. Please place it in the mailbox as  soon as possible. Your physician should have your test results approximately 7 days after the  monitor has been mailed back to [facility].  Call [facility] Customer Care at [phone] if you have questions regarding  your ZIO XT patch monitor. Call them immediately if you see an orange light blinking on your  monitor.  If your  monitor falls off in less than 4 days, contact our Monitor department at [phone].  If your monitor becomes loose or falls off after 4 days call [employer] at [phone] for  suggestions on securing your monitor.  Your physician has requested that you have a lexiscan myoview.   The test will take approximately 3 to 4 hours to complete; you may bring reading material.  If someone comes with you to your appointment, they will need to remain in the main lobby due to limited space in the testing area. **If you are pregnant or breastfeeding, please notify the nuclear lab prior to your appointment**  How to prepare for your Myocardial Perfusion Test: Do not eat or drink 3 hours prior to your test, except you may have water. Do not consume products containing caffeine (regular or decaffeinated) 12 hours prior to your test. (ex: coffee, chocolate, sodas, tea). Do bring a list of your current medications with you.  If not listed below, you may take your  medications as normal. Do wear comfortable clothes (no dresses or overalls) and walking shoes, tennis shoes preferred (No heels or open toe shoes are allowed). Do NOT wear cologne, perfume, aftershave, or lotions (deodorant is allowed). If these instructions are not followed, your test will have to be rescheduled.   Follow-Up: At [facility], you and your health needs are our priority.  As part of our continuing mission to provide you with exceptional heart care, our providers are all part of one team.  This team includes your primary Cardiologist (physician) and Advanced Practice Providers or APPs (Physician Assistants and Nurse Practitioners) who all work together to provide you with the care you need, when you need it.  Your next appointment:   16 week(s)  Provider:   Thomasene Ripple, DO     We recommend signing up for the patient portal called "MyChart".  Sign up information is provided on this After Visit Summary.  MyChart is used to connect with patients for Virtual Visits (Telemedicine).  Patients are able to view lab/test results, encounter notes, upcoming appointments, etc.  Non-urgent messages can be sent to your provider as well.   To learn more about what you can do with MyChart, go to [email or web address].   Other Instructions  Please take your blood pressure daily for 2 weeks and send in a MyChart message. Please include heart rates. (One message at the end of the 2 weeks).   HOW TO TAKE YOUR BLOOD PRESSURE: Rest 5 minutes before taking your blood pressure. Contreras't smoke or drink caffeinated beverages for at least 30 minutes before. Take your blood pressure before (not after) you eat. Sit comfortably with your back supported and both feet on the floor (Contreras't cross your legs). Elevate your arm to heart level on a table or a desk. Use the proper sized cuff. It should fit smoothly and snugly around your bare upper arm. There should be enough room to slip a fingertip  under the cuff. The bottom edge of the cuff should be 1 inch above the crease of the elbow. Ideally, take 3 measurements at one sitting and record the average.       1st Floor: - Lobby - Registration  - Pharmacy  - Lab - Cafe  2nd Floor: - PV Lab - Diagnostic Testing (echo, CT, nuclear med)  3rd Floor: - Vacant  4th Floor: - TCTS (cardiothoracic surgery) - AFib Clinic - Structural Heart Clinic - Vascular Surgery  - Vascular Ultrasound  5th Floor: -  HeartCare Cardiology (general and EP) - Clinical Pharmacy for coumadin, hypertension, lipid, weight-loss medications, and med management appointments    Valet parking services will be available as well.      Adopting a Healthy Lifestyle.  Know what a healthy weight is for you (roughly BMI <25) and aim to maintain this   Aim for 7+ servings of fruits and vegetables daily   65-80+ fluid ounces of water or unsweet tea for healthy kidneys   Limit to max 1 drink of alcohol per day; avoid smoking/tobacco   Limit animal fats in diet for cholesterol and heart health - choose grass fed whenever available   Avoid highly processed foods, and foods high in saturated/trans fats   Aim for low stress - take time to unwind and care for your mental health   Aim for 150 min of moderate intensity exercise weekly for heart health, and weights twice weekly for bone health   Aim for 7-9 hours of sleep daily   When it comes to diets, agreement about the perfect plan isnt easy to find, even among the experts. Experts at the [facility] of [employer] developed an idea known as the Healthy Eating Plate. Just imagine a plate divided into logical, healthy portions.   The emphasis is on diet quality:   Load up on vegetables and fruits - one-half of your plate: Aim for color and variety, and remember that potatoes dont count.   Go for whole grains - one-quarter of your plate: Whole wheat, barley, wheat berries, quinoa, oats, brown  rice, and foods made with them. If you want pasta, go with whole wheat pasta.   Protein power - one-quarter of your plate: Fish, chicken, beans, and nuts are all healthy, versatile protein sources. Limit red meat.   The diet, however, does go beyond the plate, offering a few other suggestions.   Use healthy plant oils, such as olive, canola, soy, corn, sunflower and peanut. Check the labels, and avoid partially hydrogenated oil, which have unhealthy trans fats.   If youre thirsty, drink water. Coffee and tea are good in moderation, but skip sugary drinks and limit milk and dairy products to one or two daily servings.   The type of carbohydrate in the diet is more important than the amount. Some sources of carbohydrates, such as vegetables, fruits, whole grains, and beans-are healthier than others.   Finally, stay active  Signed, Thomasene Ripple, DO  10/01/2023 10:39 AM    [facility]

## 2023-09-28 NOTE — Progress Notes (Unsigned)
 Enrolled for [employer] to mail a ZIO XT long term holter monitor to the patients address on file.

## 2023-09-28 NOTE — Patient Instructions (Addendum)
 Medication Instructions:  Your physician has recommended you make the following change in your medication:  START: Nitroglycerin 0.4 mg place 1 tablet under your tongue every 5 minutes (up to three times) as needed for chest pain.  *If you need a refill on your cardiac medications before your next appointment, please call your pharmacy*  Lab Work: CMET, Mag If you have labs (blood work) drawn today and your tests are completely normal, you will receive your results only by: MyChart Message (if you have MyChart) OR A paper copy in the mail If you have any lab test that is abnormal or we need to change your treatment, we will call you to review the results.  Testing/Procedures:  Christena Deem- Long Term Monitor Instructions Your physician has requested you wear a ZIO patch monitor for 14 days.  This is a single patch monitor. [employer] supplies one patch monitor per enrollment. Additional stickers are not available. Please do not apply patch if you will be having a Nuclear Stress Test,  Echocardiogram, Cardiac CT, MRI, or Chest Xray during the period you would be wearing the  monitor. The patch cannot be worn during these tests. You cannot remove and re-apply the  ZIO XT patch monitor.  Your ZIO patch monitor will be mailed 3 day USPS to your address on file. It may take 3-5 days  to receive your monitor after you have been enrolled.  Once you have received your monitor, please review the enclosed instructions. Your monitor  has already been registered assigning a specific monitor serial # to you.  Billing and Patient Assistance Program Information  We have supplied [employer] with any of your insurance information on file for billing purposes. [employer] offers a sliding scale Patient Assistance Program for patients that do not have  insurance, or whose insurance does not completely cover the cost of the ZIO monitor.  You must apply for the Patient Assistance Program to qualify for this discounted  rate.  To apply, please call [employer] at [phone], select option 4, select option 2, ask to apply for  Patient Assistance Program. Meredeth Ide will ask your household income, and how many people  are in your household. They will quote your out-of-pocket cost based on that information.  [employer] will also be able to set up a 41-month, interest-free payment plan if needed.  Applying the monitor   Shave hair from upper left chest.  Hold abrader disc by orange tab. Rub abrader in 40 strokes over the upper left chest as  indicated in your monitor instructions.  Clean area with 4 enclosed alcohol pads. Let dry.  Apply patch as indicated in monitor instructions. Patch will be placed under collarbone on left  side of chest with arrow pointing upward.  Rub patch adhesive wings for 2 minutes. Remove white label marked "1". Remove the white  label marked "2". Rub patch adhesive wings for 2 additional minutes.  While looking in a mirror, press and release button in center of patch. A small green light will  flash 3-4 times. This will be your only indicator that the monitor has been turned on.  Do not shower for the first 24 hours. You may shower after the first 24 hours.  Press the button if you feel a symptom. You will hear a small click. Record Date, Time and  Symptom in the Patient Logbook.  When you are ready to remove the patch, follow instructions on the last 2 pages of Patient  Logbook. Stick patch monitor onto  the last page of Patient Logbook.  Place Patient Logbook in the blue and white box. Use locking tab on box and tape box closed  securely. The blue and white box has prepaid postage on it. Please place it in the mailbox as  soon as possible. Your physician should have your test results approximately 7 days after the  monitor has been mailed back to [facility].  Call [facility] Customer Care at [phone] if you have questions regarding  your ZIO XT patch monitor. Call them  immediately if you see an orange light blinking on your  monitor.  If your monitor falls off in less than 4 days, contact our Monitor department at [phone].  If your monitor becomes loose or falls off after 4 days call [employer] at [phone] for  suggestions on securing your monitor.  Your physician has requested that you have a lexiscan myoview.   The test will take approximately 3 to 4 hours to complete; you may bring reading material.  If someone comes with you to your appointment, they will need to remain in the main lobby due to limited space in the testing area. **If you are pregnant or breastfeeding, please notify the nuclear lab prior to your appointment**  How to prepare for your Myocardial Perfusion Test: Do not eat or drink 3 hours prior to your test, except you may have water. Do not consume products containing caffeine (regular or decaffeinated) 12 hours prior to your test. (ex: coffee, chocolate, sodas, tea). Do bring a list of your current medications with you.  If not listed below, you may take your medications as normal. Do wear comfortable clothes (no dresses or overalls) and walking shoes, tennis shoes preferred (No heels or open toe shoes are allowed). Do NOT wear cologne, perfume, aftershave, or lotions (deodorant is allowed). If these instructions are not followed, your test will have to be rescheduled.   Follow-Up: At [facility], you and your health needs are our priority.  As part of our continuing mission to provide you with exceptional heart care, our providers are all part of one team.  This team includes your primary Cardiologist (physician) and Advanced Practice Providers or APPs (Physician Assistants and Nurse Practitioners) who all work together to provide you with the care you need, when you need it.  Your next appointment:   16 week(s)  Provider:   Thomasene Ripple, DO     We recommend signing up for the patient portal called "MyChart".   Sign up information is provided on this After Visit Summary.  MyChart is used to connect with patients for Virtual Visits (Telemedicine).  Patients are able to view lab/test results, encounter notes, upcoming appointments, etc.  Non-urgent messages can be sent to your provider as well.   To learn more about what you can do with MyChart, go to [email or web address].   Other Instructions  Please take your blood pressure daily for 2 weeks and send in a MyChart message. Please include heart rates. (One message at the end of the 2 weeks).   HOW TO TAKE YOUR BLOOD PRESSURE: Rest 5 minutes before taking your blood pressure. Don't smoke or drink caffeinated beverages for at least 30 minutes before. Take your blood pressure before (not after) you eat. Sit comfortably with your back supported and both feet on the floor (don't cross your legs). Elevate your arm to heart level on a table or a desk. Use the proper sized cuff. It should fit smoothly and snugly around your  bare upper arm. There should be enough room to slip a fingertip under the cuff. The bottom edge of the cuff should be 1 inch above the crease of the elbow. Ideally, take 3 measurements at one sitting and record the average.       1st Floor: - Lobby - Registration  - Pharmacy  - Lab - Cafe  2nd Floor: - PV Lab - Diagnostic Testing (echo, CT, nuclear med)  3rd Floor: - Vacant  4th Floor: - TCTS (cardiothoracic surgery) - AFib Clinic - Structural Heart Clinic - Vascular Surgery  - Vascular Ultrasound  5th Floor: - HeartCare Cardiology (general and EP) - Clinical Pharmacy for coumadin, hypertension, lipid, weight-loss medications, and med management appointments    Valet parking services will be available as well.

## 2023-10-10 ENCOUNTER — Encounter: Payer: Self-pay | Admitting: *Deleted

## 2023-10-10 ENCOUNTER — Telehealth: Payer: Self-pay | Admitting: Cardiology

## 2023-10-10 NOTE — Telephone Encounter (Signed)
 Pt called to give morning (after breakfast) BP's : 3/29-128/63 HR 58 3/30-127/64 HR 66 3/31-128/69 HR 82 4/1-140/69 HR 58 4/2-129/71 HR 93 4/3-130/67 HR 62 4/4-130/67 HR 88 4/5-131/74 HR 65 4/6-129/67 HR 88 4/7-138/65 HR 55 4/8-139/70 HR 55

## 2023-10-11 ENCOUNTER — Telehealth (HOSPITAL_COMMUNITY): Payer: Self-pay | Admitting: *Deleted

## 2023-10-11 NOTE — Telephone Encounter (Signed)
 Called pt, left message with Dr. Mallory Shirk recommendations.

## 2023-10-11 NOTE — Telephone Encounter (Signed)
 Left message on voicemail per DPR in reference to upcoming appointment scheduled on 10/16/2023 at 8:15 with detailed instructions given per Myocardial Perfusion Study Information Sheet for the test. LM to arrive 15 minutes early, and that it is imperative to arrive on time for appointment to keep from having the test rescheduled. If you need to cancel or reschedule your appointment, please call the office within 24 hours of your appointment. Failure to do so may result in a cancellation of your appointment, and a $50 no show fee. Phone number given for call back for any questions.

## 2023-10-16 ENCOUNTER — Ambulatory Visit (HOSPITAL_COMMUNITY)

## 2023-10-17 ENCOUNTER — Ambulatory Visit (HOSPITAL_COMMUNITY)

## 2023-10-27 DIAGNOSIS — R002 Palpitations: Secondary | ICD-10-CM

## 2023-11-02 DIAGNOSIS — E1169 Type 2 diabetes mellitus with other specified complication: Secondary | ICD-10-CM | POA: Diagnosis not present

## 2023-11-02 DIAGNOSIS — K7689 Other specified diseases of liver: Secondary | ICD-10-CM | POA: Diagnosis not present

## 2023-11-02 DIAGNOSIS — I491 Atrial premature depolarization: Secondary | ICD-10-CM | POA: Diagnosis not present

## 2023-11-02 DIAGNOSIS — R609 Edema, unspecified: Secondary | ICD-10-CM | POA: Diagnosis not present

## 2023-11-03 ENCOUNTER — Other Ambulatory Visit: Payer: Self-pay | Admitting: Family Medicine

## 2023-11-03 DIAGNOSIS — K7689 Other specified diseases of liver: Secondary | ICD-10-CM

## 2023-11-13 ENCOUNTER — Other Ambulatory Visit: Payer: Self-pay | Admitting: Cardiology

## 2023-11-13 ENCOUNTER — Telehealth (HOSPITAL_COMMUNITY): Payer: Self-pay | Admitting: Cardiology

## 2023-11-13 DIAGNOSIS — R0789 Other chest pain: Secondary | ICD-10-CM

## 2023-11-13 NOTE — Telephone Encounter (Signed)
 Will send this message to the pts Primary Cardiologist and covering RN as a general FYI.

## 2023-11-13 NOTE — Telephone Encounter (Signed)
 Patient called and cancelled Myoview due to issue with schedule and will call back when she can to reschedule.   11/13/23 Cancel Rsn: Patient (appt conflict with her Husband's doctors appts. Will c/b to r/s) Jasmine Contreras    Order will be removed from the [facility]. If patient calls back to reschedule we will reinstate the order. Thank you.

## 2023-11-14 ENCOUNTER — Ambulatory Visit: Payer: Self-pay | Admitting: Cardiology

## 2023-11-14 ENCOUNTER — Ambulatory Visit (HOSPITAL_COMMUNITY)

## 2023-11-15 ENCOUNTER — Ambulatory Visit (HOSPITAL_COMMUNITY)

## 2023-11-15 ENCOUNTER — Other Ambulatory Visit: Payer: Self-pay

## 2023-11-15 MED ORDER — METOPROLOL SUCCINATE ER 25 MG PO TB24: ORAL_TABLET | ORAL | 3 refills | Status: AC

## 2023-11-15 NOTE — Progress Notes (Signed)
New prescription sent to pts preferred pharmacy

## 2023-11-16 ENCOUNTER — Ambulatory Visit
Admission: RE | Admit: 2023-11-16 | Discharge: 2023-11-16 | Disposition: A | Discharge status: Home / Self Care | Source: Ambulatory Visit | Attending: Family Medicine | Admitting: Family Medicine

## 2023-11-16 DIAGNOSIS — K7689 Other specified diseases of liver: Secondary | ICD-10-CM | POA: Diagnosis not present

## 2023-12-06 ENCOUNTER — Other Ambulatory Visit: Payer: Self-pay | Admitting: Family Medicine

## 2023-12-06 DIAGNOSIS — Z1231 Encounter for screening mammogram for malignant neoplasm of breast: Secondary | ICD-10-CM

## 2023-12-12 ENCOUNTER — Encounter: Payer: Self-pay | Admitting: Cardiology

## 2023-12-13 ENCOUNTER — Ambulatory Visit
Admission: RE | Admit: 2023-12-13 | Discharge: 2023-12-13 | Disposition: A | Discharge status: Home / Self Care | Source: Ambulatory Visit | Attending: Family Medicine | Admitting: Family Medicine

## 2023-12-13 DIAGNOSIS — Z1231 Encounter for screening mammogram for malignant neoplasm of breast: Secondary | ICD-10-CM | POA: Diagnosis not present

## 2024-01-02 DIAGNOSIS — H25811 Combined forms of age-related cataract, right eye: Secondary | ICD-10-CM | POA: Diagnosis not present

## 2024-01-02 DIAGNOSIS — H25812 Combined forms of age-related cataract, left eye: Secondary | ICD-10-CM | POA: Diagnosis not present

## 2024-01-02 DIAGNOSIS — Z9889 Other specified postprocedural states: Secondary | ICD-10-CM | POA: Diagnosis not present

## 2024-01-12 ENCOUNTER — Ambulatory Visit: Attending: Cardiology | Admitting: Cardiology

## 2024-01-12 ENCOUNTER — Encounter: Payer: Self-pay | Admitting: Cardiology

## 2024-01-12 VITALS — BP 146/72 | HR 62 | Ht 67.0 in | Wt 258.6 lb

## 2024-01-12 DIAGNOSIS — E66813 Obesity, class 3: Secondary | ICD-10-CM | POA: Diagnosis not present

## 2024-01-12 DIAGNOSIS — I493 Ventricular premature depolarization: Secondary | ICD-10-CM | POA: Diagnosis not present

## 2024-01-12 DIAGNOSIS — Z6841 Body Mass Index (BMI) 40.0 and over, adult: Secondary | ICD-10-CM

## 2024-01-12 DIAGNOSIS — I10 Essential (primary) hypertension: Secondary | ICD-10-CM

## 2024-01-12 NOTE — Progress Notes (Unsigned)
 Cardiology Office Note:    Date:  01/13/2024   ID:  Jasmine Contreras, DOB 06-Jun-1951, MRN [id number]  PCP:  Teresa Channel, MD  Cardiologist:  Dub Huntsman, DO  Electrophysiologist:  None   Referring MD: Teresa Channel, MD    I am having chest pain   History of Present Illness:    Jasmine Contreras is a 73 y.o. female with a hx of Hypertension, DM2, PVC.   At her last visit we I placed a monitor on the patient, and ordered a CCTA ( with prep). She declined the CCTA and later a nuclear stress test was ordered but the patient canceled this test and has not reschedule.   She acknowledges an increasing trend in her blood pressure. She is on metoprolol  25 mg, taking one in the morning and two in the evening. This has alleviated her 'night heart rushings', though she still experiences episodes of tachycardia. Dizziness occurs approximately once every two weeks, associated with these episodes. Significant stress from her husband's cancer diagnosis and treatment has impacted her health management, leading to missed doses of her blood pressure medication. She has not completed a stress test as previously planned, having canceled it twice due to her husband's medical needs.    Past Medical History:  Diagnosis Date   Acid reflux    Anemia    Anxiety    situational   Arthritis    trochanteric bursitis   Asthma    years ago   Back pain    Chest pain    Complication of anesthesia    slow to wake up   Cough    Depression    situaltional   Diarrhea    Difficulty swallowing    Dizziness    Double vision    Dry mouth    Dysrhythmia    PAC's, PVC's   Fatigue    GERD (gastroesophageal reflux disease)    Gout    Headache    migraines ocassionally - stress related   Heart murmur    not heard by Dr. Sharmon in 2014.  Dr. Teresa did not hear  murmer at 05/15/20 appointment.   Hepatitis    cyst   Herpes    Hiatal hernia    Hypertension    Irritable bowel syndrome    Itching     Joint pain    MVP (mitral valve prolapse)    Neck stiffness    Palpitations    Pneumonia    PONV (postoperative nausea and vomiting)    PUD (peptic ulcer disease)    s/p vagotomy   Retaining fluid    Ringing in ears    Shortness of breath    exertion   Stress    Swelling of extremity    Swelling of finger    TIA (transient ischemic attack)    Tinnitus    Varicose veins    Vertigo    Vision changes    Vitamin D  deficiency    Wheezing     Past Surgical History:  Procedure Laterality Date   APPENDECTOMY     BREAST BIOPSY Right 2024   BREAST EXCISIONAL BIOPSY Left    CARDIAC CATHETERIZATION     Twice, was found normal, no stents needed   CHOLECYSTECTOMY N/A 11/08/2019   Procedure: LAPAROSCOPIC TO DRAIN HEPATIC CYSTS AND UPPER ENDOSCOPY;  Surgeon: Gladis Cough, MD;  Location: [facility] ORS;  Service: General;  Laterality: N/A;   COLONOSCOPY     EYE SURGERY Bilateral  Lasik surgery   FOOT SURGERY Bilateral    hammer toes, in grown toemails   GALLBLADDER SURGERY     06/09/20--- YEARS AGO- (OTHER GB SURGERY LISTED WAS ON LIVER CYST)   LAPAROSCOPIC LIVER CYST UNROOFING N/A 06/12/2020   Procedure: LAPAROSCOPIC FENESTRATION OF HEPATIC CYSTS;  Surgeon: Dasie Leonor CROME, MD;  Location: [facility] OR;  Service: General;  Laterality: N/A;   LASIK     OPEN PARTIAL HEPATECTOMY  N/A 02/19/2021   Procedure: OPEN FENESTRATION OF HEPATIC CYST WITH INTRAOPERATIVE ULTRASOUND;  Surgeon: Dasie Leonor CROME, MD;  Location: [facility] OR;  Service: General;  Laterality: N/A;   partial gastrectomy with vagotomy     right arthroscopic knee surgery Right    trochanteric bursectomy Bilateral    hips   VAGINAL HYSTERECTOMY     with oophorectomy    Current Medications: Current Meds  Medication Sig   acetaminophen  (TYLENOL ) 325 MG tablet Take 325-650 mg by mouth every 6 (six) hours as needed (for pain or headaches).   albuterol  (VENTOLIN  HFA) 108 (90 Base) MCG/ACT inhaler Inhale into the lungs every 6 (six) hours as  needed for wheezing or shortness of breath.   Ascorbic Acid (VITAMIN C) 1000 MG tablet Take 2,000 mg by mouth daily.   Biotin 5 MG TABS Take 5 mg by mouth daily.   carboxymethylcellulose (REFRESH PLUS) 0.5 % SOLN Place 1 drop into both eyes daily as needed (Dry eye).   Cholecalciferol (VITAMIN D3) 50 MCG (2000 UT) TABS Take 2,000 Units by mouth daily.   colchicine  0.6 MG tablet Take 0.6 mg by mouth daily as needed (as directed for gout flares).    Cyanocobalamin (VITAMIN B-12 PO) Take 2,500 mcg by mouth daily.   EPINEPHrine 0.3 mg/0.3 mL IJ SOAJ injection Inject 0.3 mg into the muscle once as needed for anaphylaxis.    ferrous gluconate (FERGON) 240 (27 FE) MG tablet Take 240 mg by mouth daily.   levocetirizine (XYZAL) 5 MG tablet Take 5 mg by mouth daily as needed for allergies.   methocarbamol  (ROBAXIN ) 750 MG tablet Take 1 tablet (750 mg total) by mouth every 6 (six) hours as needed for muscle spasms.   metoprolol  succinate (TOPROL  XL) 25 MG 24 hr tablet Take 25 mg (one tablet) in the morning take 50 mg (two tablets) at night.   nitroGLYCERIN  (NITROSTAT ) 0.4 MG SL tablet Place 1 tablet (0.4 mg total) under the tongue every 5 (five) minutes as needed for chest pain.   pantoprazole  (PROTONIX ) 40 MG tablet Take 30- 60 min before your first and last meals of the day (Patient taking differently: Take 40 mg by mouth 2 (two) times daily.)   potassium chloride  SA (K-DUR,KLOR-CON ) 20 MEQ tablet Take 40 mEq by mouth 2 (two) times daily.   sucralfate  (CARAFATE ) 1 g tablet Take 1 g by mouth 2 (two) times daily.   torsemide  40 MG TABS Take 40 mg by mouth daily. As directed   TURMERIC PO Take 300 mg by mouth daily.     Allergies:   Contrast media [iodinated contrast media], Honey bee treatment [bee venom], Other, Peanuts [peanut oil], Shellfish-derived products, Strawberry extract, Chlorhexidine , Abilify [aripiprazole], Adhesive [tape], Barium, Codeine, Hydrocodone, Latex, Meclizine hcl, Oxycodone  hcl,  Prednisone , Scopace [scopolamine ], Sertraline, Tavist-d [albertsons dayhist-d], and Tramadol    Social History   Socioeconomic History   Marital status: Married    Spouse name: Lynwood   Number of children: 3   Years of education: College   Highest education level: Not on  file  Occupational History   Occupation: Retired   Tobacco Use   Smoking status: Never   Smokeless tobacco: Never  Vaping Use   Vaping status: Former  Substance and Sexual Activity   Alcohol  use: Yes    Alcohol /week: 0.0 standard drinks of alcohol     Comment: 1 glass of wine with dinner- ocassional   Drug use: Not Currently    Comment: cbd oils- 06/09/20- have not used in a while    Sexual activity: Not Currently    Birth control/protection: Surgical  Other Topics Concern   Not on file  Social History Narrative   Patient drinks a pepsi or glass of tea in the morning.    Social Drivers of Corporate investment banker Strain: Not on file  Food Insecurity: Not on file  Transportation Needs: Not on file  Physical Activity: Not on file  Stress: Not on file  Social Connections: Not on file     Family History: The patient's family history includes Alcohol  abuse in her father; Depression in her father; Diabetes in her mother; Hyperlipidemia in her father; Hypertension in her father and mother; Kidney disease in her mother; Sleep apnea in her father; Stroke in her father.  ROS:   Review of Systems  Constitution: Negative for decreased appetite, fever and weight gain.  HENT: Negative for congestion, ear discharge, hoarse voice and sore throat.   Eyes: Negative for discharge, redness, vision loss in right eye and visual halos.  Cardiovascular: Negative for chest pain, dyspnea on exertion, leg swelling, orthopnea and palpitations.  Respiratory: Negative for cough, hemoptysis, shortness of breath and snoring.   Endocrine: Negative for heat intolerance and polyphagia.  Hematologic/Lymphatic: Negative for bleeding  problem. Does not bruise/bleed easily.  Skin: Negative for flushing, nail changes, rash and suspicious lesions.  Musculoskeletal: Negative for arthritis, joint pain, muscle cramps, myalgias, neck pain and stiffness.  Gastrointestinal: Negative for abdominal pain, bowel incontinence, diarrhea and excessive appetite.  Genitourinary: Negative for decreased libido, genital sores and incomplete emptying.  Neurological: Negative for brief paralysis, focal weakness, headaches and loss of balance.  Psychiatric/Behavioral: Negative for altered mental status, depression and suicidal ideas.  Allergic/Immunologic: Negative for HIV exposure and persistent infections.    EKGs/Labs/Other Studies Reviewed:    The following studies were reviewed today:   EKG:  The ekg ordered today demonstrates   Recent Labs: No results found for requested labs within last 365 days.  Recent Lipid Panel    Component Value Date/Time   CHOL 165 03/28/2018 1032   TRIG 77 03/28/2018 1032   HDL 64 03/28/2018 1032   LDLCALC 86 03/28/2018 1032    Physical Exam:    VS:  BP (!) 146/72   Pulse 62   Ht 5' 7 (1.702 m)   Wt 258 lb 9.6 oz (117.3 kg)   SpO2 94%   BMI 40.50 kg/m     Wt Readings from Last 3 Encounters:  01/12/24 258 lb 9.6 oz (117.3 kg)  09/28/23 264 lb 6.4 oz (119.9 kg)  07/26/23 263 lb (119.3 kg)     GEN: Well nourished, well developed in no acute distress HEENT: Normal NECK: No JVD; No carotid bruits LYMPHATICS: No lymphadenopathy CARDIAC: S1S2 noted,RRR, no murmurs, rubs, gallops RESPIRATORY:  Clear to auscultation without rales, wheezing or rhonchi  ABDOMEN: Soft, non-tender, non-distended, +bowel sounds, no guarding. EXTREMITIES: No edema, No cyanosis, no clubbing MUSCULOSKELETAL:  No deformity  SKIN: Warm and dry NEUROLOGIC:  Alert and oriented x 3, non-focal  PSYCHIATRIC:  Normal affect, good insight  ASSESSMENT:    1. PVC (premature ventricular contraction)   2. Primary hypertension    3. Class 3 severe obesity with serious comorbidity and body mass index (BMI) of 40.0 to 44.9 in adult     PLAN:    PVC Palpitations improved with metoprolol , but intermittent tachycardia noted. - Continue metoprolol  25 mg in the am and 50 mg at night.  - Monitor symptoms and adjust dosage if necessary. I shared information with the patient for [facility].  Hypertension Hypertension uncontrolled at 159/66 mmHg. Stress and inconsistent medication adherence noted. - Monitor blood pressure twice daily for two weeks. - Discuss management with pharmacist if needed. - She prefers to revisit stress test scheduling once circumstances stabilize.     The patient is in agreement with the above plan. The patient left the office in stable condition.  The patient will follow up in   Medication Adjustments/Labs and Tests Ordered: Current medicines are reviewed at length with the patient today.  Concerns regarding medicines are outlined above.  No orders of the defined types were placed in this encounter.  No orders of the defined types were placed in this encounter.   Patient Instructions  Medication Instructions:  Your physician recommends that you continue on your current medications as directed. Please refer to the Current Medication list given to you today.  *If you need a refill on your cardiac medications before your next appointment, please call your pharmacy*  Follow-Up: At [facility], you and your health needs are our priority.  As part of our continuing mission to provide you with exceptional heart care, our providers are all part of one team.  This team includes your primary Cardiologist (physician) and Advanced Practice Providers or APPs (Physician Assistants and Nurse Practitioners) who all work together to provide you with the care you need, when you need it.  Your next appointment:   6 month(s)  Provider:   Dallan Schonberg, DO    Other Instructions Please take  your blood pressure daily for 2 weeks and send in a MyChart message. Please include heart rates. (One message at the end of the 2 weeks).   HOW TO TAKE YOUR BLOOD PRESSURE: Rest 5 minutes before taking your blood pressure. Don't smoke or drink caffeinated beverages for at least 30 minutes before. Take your blood pressure before (not after) you eat. Sit comfortably with your back supported and both feet on the floor (don't cross your legs). Elevate your arm to heart level on a table or a desk. Use the proper sized cuff. It should fit smoothly and snugly around your bare upper arm. There should be enough room to slip a fingertip under the cuff. The bottom edge of the cuff should be 1 inch above the crease of the elbow. Ideally, take 3 measurements at one sitting and record the average.    Adopting a Healthy Lifestyle.  Know what a healthy weight is for you (roughly BMI <25) and aim to maintain this   Aim for 7+ servings of fruits and vegetables daily   65-80+ fluid ounces of water or unsweet tea for healthy kidneys   Limit to max 1 drink of alcohol  per day; avoid smoking/tobacco   Limit animal fats in diet for cholesterol and heart health - choose grass fed whenever available   Avoid highly processed foods, and foods high in saturated/trans fats   Aim for low stress - take time to unwind and care  for your mental health   Aim for 150 min of moderate intensity exercise weekly for heart health, and weights twice weekly for bone health   Aim for 7-9 hours of sleep daily   When it comes to diets, agreement about the perfect plan isnt easy to find, even among the experts. Experts at the [facility] of [employer] developed an idea known as the Healthy Eating Plate. Just imagine a plate divided into logical, healthy portions.   The emphasis is on diet quality:   Load up on vegetables and fruits - one-half of your plate: Aim for color and variety, and remember that potatoes dont  count.   Go for whole grains - one-quarter of your plate: Whole wheat, barley, wheat berries, quinoa, oats, brown rice, and foods made with them. If you want pasta, go with whole wheat pasta.   Protein power - one-quarter of your plate: Fish, chicken, beans, and nuts are all healthy, versatile protein sources. Limit red meat.   The diet, however, does go beyond the plate, offering a few other suggestions.   Use healthy plant oils, such as olive, canola, soy, corn, sunflower and peanut. Check the labels, and avoid partially hydrogenated oil, which have unhealthy trans fats.   If youre thirsty, drink water. Coffee and tea are good in moderation, but skip sugary drinks and limit milk and dairy products to one or two daily servings.   The type of carbohydrate in the diet is more important than the amount. Some sources of carbohydrates, such as vegetables, fruits, whole grains, and beans-are healthier than others.   Finally, stay active  Signed, Dub Huntsman, DO  01/13/2024 9:05 AM    [facility]

## 2024-01-12 NOTE — Patient Instructions (Signed)
 Medication Instructions:  Your physician recommends that you continue on your current medications as directed. Please refer to the Current Medication list given to you today.  *If you need a refill on your cardiac medications before your next appointment, please call your pharmacy*   Follow-Up: At [facility], you and your health needs are our priority.  As part of our continuing mission to provide you with exceptional heart care, our providers are all part of one team.  This team includes your primary Cardiologist (physician) and Advanced Practice Providers or APPs (Physician Assistants and Nurse Practitioners) who all work together to provide you with the care you need, when you need it.  Your next appointment:   6 month(s)  Provider:   Kardie Tobb, DO     Other Instructions Please take your blood pressure daily for 2 weeks and send in a MyChart message. Please include heart rates. (One message at the end of the 2 weeks).   HOW TO TAKE YOUR BLOOD PRESSURE: Rest 5 minutes before taking your blood pressure. Don't smoke or drink caffeinated beverages for at least 30 minutes before. Take your blood pressure before (not after) you eat. Sit comfortably with your back supported and both feet on the floor (don't cross your legs). Elevate your arm to heart level on a table or a desk. Use the proper sized cuff. It should fit smoothly and snugly around your bare upper arm. There should be enough room to slip a fingertip under the cuff. The bottom edge of the cuff should be 1 inch above the crease of the elbow. Ideally, take 3 measurements at one sitting and record the average.

## 2024-01-26 ENCOUNTER — Encounter (INDEPENDENT_AMBULATORY_CARE_PROVIDER_SITE_OTHER): Payer: Self-pay | Admitting: Otolaryngology

## 2024-01-26 ENCOUNTER — Ambulatory Visit (INDEPENDENT_AMBULATORY_CARE_PROVIDER_SITE_OTHER): Payer: Medicare Other | Admitting: Otolaryngology

## 2024-01-26 VITALS — BP 128/77 | HR 60

## 2024-01-26 DIAGNOSIS — H6123 Impacted cerumen, bilateral: Secondary | ICD-10-CM

## 2024-01-26 DIAGNOSIS — H9201 Otalgia, right ear: Secondary | ICD-10-CM

## 2024-01-26 DIAGNOSIS — M26621 Arthralgia of right temporomandibular joint: Secondary | ICD-10-CM | POA: Diagnosis not present

## 2024-01-27 DIAGNOSIS — H9201 Otalgia, right ear: Secondary | ICD-10-CM | POA: Insufficient documentation

## 2024-01-27 NOTE — Progress Notes (Signed)
 Patient ID: Jasmine Contreras, female   DOB: [birth year], 73 y.o.   MRN: [id number]  CC: Right ear pain  HPI: The patient is a 73 year old female who presents today complaining of recurrent right ear pain.  According to the patient, she has been symptomatic for more than 4 years.  She also has a history of bilateral stenotic ear canals, resulting in recurrent cerumen impaction.  Currently she denies any otorrhea, vertigo, or significant hearing difficulty.  Exam: General: Communicates without difficulty, well nourished, no acute distress. Head: Normocephalic, no evidence injury, no tenderness, facial buttresses intact without stepoff. Face/sinus: No tenderness to palpation and percussion. Facial movement is normal and symmetric. Eyes: PERRL, EOMI. No scleral icterus, conjunctivae clear. Neuro: CN II exam reveals vision grossly intact.  No nystagmus at any point of gaze. Ears: Auricles well formed without lesions.  Bilateral stenotic ear canals.  Bilateral cerumen impaction.  Right TMJ crepitus and pain.  Nose: External evaluation reveals normal support and skin without lesions.  Dorsum is intact.  Anterior rhinoscopy reveals congested mucosa over anterior aspect of inferior turbinates and intact septum.  No purulence noted. Oral:  Oral cavity and oropharynx are intact, symmetric, without erythema or edema.  Mucosa is moist without lesions. Neck: Full range of motion without pain.  There is no significant lymphadenopathy.  No masses palpable.  Thyroid  bed within normal limits to palpation.  Parotid glands and submandibular glands equal bilaterally without mass.  Trachea is midline. Neuro:  CN 2-12 grossly intact.   Procedure: Bilateral cerumen disimpaction Anesthesia: None Description: Under the operating microscope, the cerumen is carefully removed with a combination of cerumen currette, alligator forceps, and suction catheters.  After the cerumen is removed, the TMs are noted to be normal.  No mass,  erythema, or lesions. The patient tolerated the procedure well.    Assessment: 1.  Referred right otalgia, likely secondary to right TMJ disorder.  Significant crepitus and pain are noted within the right TMJ. 2.  Bilateral cerumen impaction.  After the cerumen disimpaction procedure, both tympanic membranes and middle ear spaces are noted to be normal.  The patient is noted to have stenotic ear canals.  Plan: 1.  The physical exam findings are reviewed with the patient. 2.  Otomicroscopy with bilateral cerumen disimpaction. 3.  NSAIDs as needed to treat her TMJ disorder.  She is encouraged to follow-up with her dentist regarding the TMJ pain. 4.  The patient will return for reevaluation in 6 months.

## 2024-01-29 DIAGNOSIS — H25812 Combined forms of age-related cataract, left eye: Secondary | ICD-10-CM | POA: Diagnosis not present

## 2024-01-29 DIAGNOSIS — H25811 Combined forms of age-related cataract, right eye: Secondary | ICD-10-CM | POA: Diagnosis not present

## 2024-01-31 ENCOUNTER — Encounter (HOSPITAL_BASED_OUTPATIENT_CLINIC_OR_DEPARTMENT_OTHER): Payer: Self-pay

## 2024-01-31 ENCOUNTER — Other Ambulatory Visit (HOSPITAL_BASED_OUTPATIENT_CLINIC_OR_DEPARTMENT_OTHER)

## 2024-02-05 ENCOUNTER — Other Ambulatory Visit: Payer: Medicare Other

## 2024-04-24 ENCOUNTER — Encounter: Payer: Self-pay | Admitting: Cardiology

## 2024-06-21 ENCOUNTER — Other Ambulatory Visit (HOSPITAL_BASED_OUTPATIENT_CLINIC_OR_DEPARTMENT_OTHER): Payer: Self-pay | Admitting: Family Medicine

## 2024-06-21 DIAGNOSIS — E2839 Other primary ovarian failure: Secondary | ICD-10-CM

## 2024-06-24 ENCOUNTER — Telehealth: Payer: Self-pay | Admitting: Cardiology

## 2024-06-24 NOTE — Telephone Encounter (Signed)
.  SYNCOPE[facility]   Pt c/o Syncope: STAT if syncope occurred within 24 hours and pt complains of lightheadedness.   High Priority if episode of passing out, completely, today or in last 24 hours   1. Did you pass out today? Yes    2. When is the last time you passed out? Today about an hour ago    3. Has this occurred multiple times? Yes   4. Did you have any symptoms prior to passing out? Increased heart palpitations   5. Did you fall? If so, are you on a blood thinner? No  About 40 days   Pt complains of having spells where she has palpitations, becomes lightheaded, then see black for a few seconds. She has not fell when this happens, but she has had several occurences today. Please advise.

## 2024-06-24 NOTE — Telephone Encounter (Signed)
 Spoke with pt regarding her symptoms. Pt stated she has been feeling fluttering in her chest that comes and goes for the last 40 days. Pt stated the fluttering caused her to become dizzy. Pt stated she had one occurrence today where her vision went black and she felt like she lost consciousness for a split second. Pt denied falling. Pt stated in the last 40 days she has had 2 other occurrences where the same thing happened. Pt did not have any blood pressure readings to share but took her BP while on the phone: 134/66 HR: 89. Pt was advised to go to an urgent care or the emergency room. Pt was told Dr. Sheena would be notified. Pt verbalized understanding. All questions if any were answered.

## 2024-07-08 ENCOUNTER — Telehealth: Payer: Self-pay | Admitting: Cardiology

## 2024-07-08 NOTE — Telephone Encounter (Signed)
 Patient states she is afraid of having stress test done because she has heard from others who have had the Lexiscan  say it was OK and other who had negative experiences with symptoms of chest heaviness/pressure during the test.  Patient states she does not feel comfortable having this test with her palpitations and the potential symptoms she could feel on top of that during testing. She is asking if there is another test she could do.   From office visit note on 09/28/23: Chest Pain Intermittent nocturnal chest pain with concern for coronary artery disease. She has had multiple lexiscans which was normal due to contrast allergy  she is not willing to get prep to consider CCTA or LHC. Therefore we are limited to nuclear imaging.  Will forward to Dr. Sheena to review.

## 2024-07-08 NOTE — Telephone Encounter (Signed)
 Pt calling to make provider aware that the reason for not having Myocardial Perfusion done is because she is fearful of the test. Please advise

## 2024-08-02 ENCOUNTER — Ambulatory Visit (INDEPENDENT_AMBULATORY_CARE_PROVIDER_SITE_OTHER): Admitting: Otolaryngology

## 2024-09-02 ENCOUNTER — Ambulatory Visit (INDEPENDENT_AMBULATORY_CARE_PROVIDER_SITE_OTHER): Admitting: Otolaryngology

## 2024-09-10 ENCOUNTER — Ambulatory Visit: Admitting: Cardiology
